# Patient Record
Sex: Female | Born: 1935 | Race: White | Hispanic: No | Smoking: Former smoker
Health system: Southern US, Community
[De-identification: ages and names within clinical notes are randomized; demographics above are authoritative.]

## PROBLEM LIST (undated history)

## (undated) DIAGNOSIS — E119 Type 2 diabetes mellitus without complications: Secondary | ICD-10-CM

## (undated) DIAGNOSIS — F0390 Unspecified dementia without behavioral disturbance: Secondary | ICD-10-CM

## (undated) DIAGNOSIS — K219 Gastro-esophageal reflux disease without esophagitis: Secondary | ICD-10-CM

## (undated) DIAGNOSIS — M1990 Unspecified osteoarthritis, unspecified site: Secondary | ICD-10-CM

## (undated) DIAGNOSIS — I10 Essential (primary) hypertension: Secondary | ICD-10-CM

## (undated) DIAGNOSIS — M109 Gout, unspecified: Secondary | ICD-10-CM

## (undated) DIAGNOSIS — C801 Malignant (primary) neoplasm, unspecified: Secondary | ICD-10-CM

## (undated) DIAGNOSIS — D649 Anemia, unspecified: Secondary | ICD-10-CM

## (undated) HISTORY — PX: MASTECTOMY: SHX3

## (undated) HISTORY — PX: ABDOMINAL HYSTERECTOMY: SHX81

---

## 2013-09-02 ENCOUNTER — Ambulatory Visit: Payer: Medicare Other | Admitting: Neurology

## 2014-03-07 ENCOUNTER — Encounter (HOSPITAL_COMMUNITY): Payer: Self-pay | Admitting: Emergency Medicine

## 2014-03-07 ENCOUNTER — Inpatient Hospital Stay (HOSPITAL_COMMUNITY)
Admission: EM | Admit: 2014-03-07 | Discharge: 2014-03-15 | DRG: 469 | Disposition: A | Discharge status: Skilled Nursing Facility | Payer: Medicare Other | Attending: Internal Medicine | Admitting: Internal Medicine

## 2014-03-07 DIAGNOSIS — Z87891 Personal history of nicotine dependence: Secondary | ICD-10-CM

## 2014-03-07 DIAGNOSIS — Z23 Encounter for immunization: Secondary | ICD-10-CM

## 2014-03-07 DIAGNOSIS — D62 Acute posthemorrhagic anemia: Secondary | ICD-10-CM | POA: Diagnosis not present

## 2014-03-07 DIAGNOSIS — S72009A Fracture of unspecified part of neck of unspecified femur, initial encounter for closed fracture: Principal | ICD-10-CM | POA: Diagnosis present

## 2014-03-07 DIAGNOSIS — G92 Toxic encephalopathy: Secondary | ICD-10-CM | POA: Diagnosis present

## 2014-03-07 DIAGNOSIS — S72002A Fracture of unspecified part of neck of left femur, initial encounter for closed fracture: Secondary | ICD-10-CM

## 2014-03-07 DIAGNOSIS — I10 Essential (primary) hypertension: Secondary | ICD-10-CM | POA: Diagnosis present

## 2014-03-07 DIAGNOSIS — F0390 Unspecified dementia without behavioral disturbance: Secondary | ICD-10-CM | POA: Diagnosis present

## 2014-03-07 DIAGNOSIS — G929 Unspecified toxic encephalopathy: Secondary | ICD-10-CM | POA: Diagnosis present

## 2014-03-07 DIAGNOSIS — M069 Rheumatoid arthritis, unspecified: Secondary | ICD-10-CM | POA: Diagnosis present

## 2014-03-07 DIAGNOSIS — W19XXXA Unspecified fall, initial encounter: Secondary | ICD-10-CM | POA: Diagnosis present

## 2014-03-07 DIAGNOSIS — R000 Tachycardia, unspecified: Secondary | ICD-10-CM | POA: Diagnosis present

## 2014-03-07 DIAGNOSIS — IMO0002 Reserved for concepts with insufficient information to code with codable children: Secondary | ICD-10-CM

## 2014-03-07 DIAGNOSIS — M1990 Unspecified osteoarthritis, unspecified site: Secondary | ICD-10-CM | POA: Diagnosis present

## 2014-03-07 DIAGNOSIS — Z7982 Long term (current) use of aspirin: Secondary | ICD-10-CM

## 2014-03-07 DIAGNOSIS — Z79899 Other long term (current) drug therapy: Secondary | ICD-10-CM

## 2014-03-07 DIAGNOSIS — E119 Type 2 diabetes mellitus without complications: Secondary | ICD-10-CM

## 2014-03-07 DIAGNOSIS — S7292XA Unspecified fracture of left femur, initial encounter for closed fracture: Secondary | ICD-10-CM | POA: Diagnosis present

## 2014-03-07 HISTORY — DX: Anemia, unspecified: D64.9

## 2014-03-07 HISTORY — DX: Gastro-esophageal reflux disease without esophagitis: K21.9

## 2014-03-07 HISTORY — DX: Gout, unspecified: M10.9

## 2014-03-07 HISTORY — DX: Unspecified dementia, unspecified severity, without behavioral disturbance, psychotic disturbance, mood disturbance, and anxiety: F03.90

## 2014-03-07 HISTORY — DX: Type 2 diabetes mellitus without complications: E11.9

## 2014-03-07 HISTORY — DX: Essential (primary) hypertension: I10

## 2014-03-07 HISTORY — DX: Malignant (primary) neoplasm, unspecified: C80.1

## 2014-03-07 HISTORY — DX: Unspecified osteoarthritis, unspecified site: M19.90

## 2014-03-07 MED ORDER — FENTANYL CITRATE 0.05 MG/ML IJ SOLN
50.0000 ug | Freq: Once | INTRAMUSCULAR | Status: DC
  Filled 2014-03-07: qty 2

## 2014-03-07 MED ORDER — FENTANYL CITRATE 0.05 MG/ML IJ SOLN
50.0000 ug | Freq: Once | INTRAMUSCULAR | Status: AC
  Administered 2014-03-07: 50 ug via INTRAMUSCULAR

## 2014-03-07 NOTE — ED Notes (Signed)
Pt's daughter st's pt has been c/o pain in left lower back radiating into left hip and left upper leg for approx 2 weeks.  St's pt has not walked since pain started.  Daughter st's pt was able to walk before pain began but wouldn't.

## 2014-03-07 NOTE — ED Provider Notes (Addendum)
CSN: [id number]     Arrival date & time 03/07/14  2005 History   First MD Initiated Contact with Patient 03/07/14 2212     Chief Complaint  Patient presents with  . Hip Pain     (Consider location/radiation/quality/duration/timing/severity/associated sxs/prior Treatment) Patient is a 80 y.o. female presenting with hip pain. The history is provided by the patient.  Hip Pain This is a recurrent problem. Pertinent negatives include no chest pain, no abdominal pain, no headaches and no shortness of breath.  patient presents with pain in her left hip and back. She some chronic back pain, but has been much worse recently. She did have a fall somewhat recently. She does have a history of mild dementia. She is being transferred from a nursing home in another town to one in [place]. She's no longer able to ambulate due to to the pain. There may also be some weakness. No dysuria. No abdominal pain. No chest pain. She has had chronic back pains and has had previous injections, but states it has not gotten better.  Past Medical History  Diagnosis Date  . Arthritis   . Gout   . Osteoarthritis   . Diabetes mellitus without complication   . Hypertension   . Dementia   . Cancer   . Anemia   . GERD (gastroesophageal reflux disease)    Past Surgical History  Procedure Laterality Date  . Mastectomy    . Abdominal hysterectomy     No family history on file. History  Substance Use Topics  . Smoking status: Former Research scientist (life sciences)  . Smokeless tobacco: Not on file  . Alcohol Use: No   OB History   Grav Para Term Preterm Abortions TAB SAB Ect Mult Living                 Review of Systems  Constitutional: Negative for activity change and appetite change.  Eyes: Negative for pain.  Respiratory: Negative for chest tightness and shortness of breath.   Cardiovascular: Negative for chest pain and leg swelling.  Gastrointestinal: Negative for nausea, vomiting, abdominal pain and diarrhea.   Genitourinary: Negative for flank pain.  Musculoskeletal: Positive for back pain and gait problem. Negative for neck stiffness.  Skin: Negative for rash.  Neurological: Negative for weakness, numbness and headaches.  Psychiatric/Behavioral: Negative for behavioral problems.      Allergies  Review of patient's allergies indicates no known allergies.  Home Medications   Current Outpatient Rx  Name  Route  Sig  Dispense  Refill  . aspirin 81 MG chewable tablet   Oral   Chew 81 mg by mouth daily.         . B Complex Vitamins (VITAMIN B COMPLEX PO)   Oral   Take 1 capsule by mouth daily.         . Cholecalciferol (VITAMIN D-3) 1000 UNITS CAPS   Oral   Take 1 capsule by mouth daily.         . furosemide (LASIX) 20 MG tablet   Oral   Take 20 mg by mouth daily.         Marland Kitchen gabapentin (NEURONTIN) 100 MG capsule   Oral   Take 100 mg by mouth 3 (three) times daily.         . lansoprazole (PREVACID) 30 MG capsule   Oral   Take 30 mg by mouth daily at 12 noon.         . leflunomide (ARAVA) 20 MG tablet  Oral   Take 20 mg by mouth daily.         Marland Kitchen OVER THE COUNTER MEDICATION   Oral   Take 1 tablet by mouth daily. Medication name: Therapy M9mg -458mcg tablet         . predniSONE (DELTASONE) 2.5 MG tablet   Oral   Take 2.5 mg by mouth 3 (three) times daily. Take 2 tablets in the morning and 1 tablet at bed time.         . simethicone (MI-ACID GAS RELIEF) 80 MG chewable tablet   Oral   Chew 80 mg by mouth 2 (two) times daily.         . sitaGLIPtin (JANUVIA) 50 MG tablet   Oral   Take 50 mg by mouth daily.         . traMADol (ULTRAM-ER) 100 MG 24 hr tablet   Oral   Take 100 mg by mouth daily.          BP 117/51  Pulse 100  Temp(Src) 98.3 F (36.8 C) (Oral)  SpO2 96% Physical Exam  Constitutional: She appears well-developed and well-nourished.  HENT:  Head: Normocephalic.  Neck: Normal range of motion. Neck supple.  Cardiovascular:  Normal rate and regular rhythm.   Pulmonary/Chest: Effort normal.  Abdominal: Soft. There is tenderness.  Right upper quadrant tenderness without rebound or guarding. No masses.  Musculoskeletal: She exhibits tenderness.  No lumbar tenderness. There is pain with palpation over the left hip. There is pain with rotation of left hip. There is pain with straight leg raise that starts with the foot around 6 inches of the bed. She's neurovascularly intact over bilateral feet. Pulses intact.    ED Course  Procedures (including critical care time) Labs Review Labs Reviewed  URINALYSIS, ROUTINE W REFLEX MICROSCOPIC - Abnormal; Notable for the following:    APPearance CLOUDY (*)    Leukocytes, UA TRACE (*)    All other components within normal limits  URINE MICROSCOPIC-ADD ON - Abnormal; Notable for the following:    Casts HYALINE CASTS (*)    All other components within normal limits  CBC WITH DIFFERENTIAL  COMPREHENSIVE METABOLIC PANEL   Imaging Review Dg Lumbar Spine Complete  03/08/2014   CLINICAL DATA Low back pain, no known injury.  EXAM LUMBAR SPINE - COMPLETE 4+ VIEW  COMPARISON None.  FINDINGS Multilevel degenerative changes with osteophyte formation and disc height loss. Diffuse osteopenia. There is mild superior endplate height loss at L5. Maintained vertebral body alignment. Atherosclerotic vascular calcifications. Lower lumbar/lumbosacral facet arthropathy.  IMPRESSION Multilevel degenerative changes. Mild superior endplate height loss at L5 is age indeterminate. Correlate for point tenderness.  SIGNATURE  Electronically Signed   By: Carlos Levering M.D.   On: 03/08/2014 01:34   Dg Hip Complete Left  03/08/2014   CLINICAL DATA Left hip pain.  EXAM LEFT HIP - COMPLETE 2+ VIEW  COMPARISON None.  FINDINGS Severe deformity of left femoral head is noted consistent with avascular necrosis. Subsequent degenerative joint disease of the left hip is noted. There is noted some lucency in the  region of the left femoral neck which may represent fracture.  IMPRESSION Severe deformity of left femoral head is noted consistent with avascular necrosis and subsequent degenerative joint disease of the left hip. Curvilinear lucency is seen below the left femoral head which may suggest fracture ; CT scan of the hip is recommended for further evaluation.  SIGNATURE  Electronically Signed   By: Dionne Ano.D.  On: 03/08/2014 01:30     EKG Interpretation None      MDM   Final diagnoses:  None    Patient with acute on chronic left hip pain. Unable to ambulate. Did have a fall a few weeks ago. X-ray pending at this time. If no fracture seen may need a CT.    Jasper Riling. Alvino Chapel, MD 03/08/14 0115  X-ray shows avascular necrosis of left hip with question of fracture. CT recommended.  Jasper Riling. Alvino Chapel, MD 03/08/14 [id number]

## 2014-03-07 NOTE — ED Notes (Signed)
Pt denies any injury or fall.  Strong pedal pulse present

## 2014-03-07 NOTE — ED Notes (Signed)
Pt arrives via EMS. Pt was driving from one nursing home to another with her daughter. Pt has had increased weakness over past 2 weeks. Their car had broken down and EMS was called with c/o severe left hip pain, unable to stand.

## 2014-03-08 ENCOUNTER — Emergency Department (HOSPITAL_COMMUNITY): Payer: Medicare Other

## 2014-03-08 DIAGNOSIS — S72002A Fracture of unspecified part of neck of left femur, initial encounter for closed fracture: Secondary | ICD-10-CM | POA: Diagnosis present

## 2014-03-08 DIAGNOSIS — S72009A Fracture of unspecified part of neck of unspecified femur, initial encounter for closed fracture: Principal | ICD-10-CM

## 2014-03-08 DIAGNOSIS — F0390 Unspecified dementia without behavioral disturbance: Secondary | ICD-10-CM | POA: Diagnosis present

## 2014-03-08 DIAGNOSIS — I10 Essential (primary) hypertension: Secondary | ICD-10-CM | POA: Diagnosis present

## 2014-03-08 DIAGNOSIS — S7290XA Unspecified fracture of unspecified femur, initial encounter for closed fracture: Secondary | ICD-10-CM

## 2014-03-08 DIAGNOSIS — M069 Rheumatoid arthritis, unspecified: Secondary | ICD-10-CM | POA: Diagnosis present

## 2014-03-08 DIAGNOSIS — S7292XA Unspecified fracture of left femur, initial encounter for closed fracture: Secondary | ICD-10-CM | POA: Diagnosis present

## 2014-03-08 DIAGNOSIS — E119 Type 2 diabetes mellitus without complications: Secondary | ICD-10-CM | POA: Diagnosis present

## 2014-03-08 DIAGNOSIS — IMO0002 Reserved for concepts with insufficient information to code with codable children: Secondary | ICD-10-CM

## 2014-03-08 LAB — COMPREHENSIVE METABOLIC PANEL
ALT: 30 U/L (ref 0–35)
AST: 37 U/L (ref 0–37)
Albumin: 2.4 g/dL — ABNORMAL LOW (ref 3.5–5.2)
Alkaline Phosphatase: 229 U/L — ABNORMAL HIGH (ref 39–117)
BUN: 38 mg/dL — ABNORMAL HIGH (ref 6–23)
CALCIUM: 9 mg/dL (ref 8.4–10.5)
CO2: 27 meq/L (ref 19–32)
CREATININE: 1.37 mg/dL — AB (ref 0.50–1.10)
Chloride: 99 mEq/L (ref 96–112)
GFR calc non Af Amer: 33 mL/min — ABNORMAL LOW (ref >90)
GFR, EST AFRICAN AMERICAN: 39 mL/min — AB (ref >90)
Glucose, Bld: 115 mg/dL — ABNORMAL HIGH (ref 70–99)
Potassium: 3.7 mEq/L (ref 3.7–5.3)
Sodium: 139 mEq/L (ref 137–147)
Total Bilirubin: 0.4 mg/dL (ref 0.3–1.2)
Total Protein: 6.4 g/dL (ref 6.0–8.3)

## 2014-03-08 LAB — GLUCOSE, CAPILLARY
Glucose-Capillary: 122 mg/dL — ABNORMAL HIGH (ref 70–99)
Glucose-Capillary: 97 mg/dL (ref 70–99)

## 2014-03-08 LAB — URINALYSIS, ROUTINE W REFLEX MICROSCOPIC
Bilirubin Urine: NEGATIVE
Glucose, UA: NEGATIVE mg/dL
Hgb urine dipstick: NEGATIVE
Ketones, ur: NEGATIVE mg/dL
NITRITE: NEGATIVE
Protein, ur: NEGATIVE mg/dL
SPECIFIC GRAVITY, URINE: 1.016 (ref 1.005–1.030)
UROBILINOGEN UA: 1 mg/dL (ref 0.0–1.0)
pH: 5.5 (ref 5.0–8.0)

## 2014-03-08 LAB — CBC WITH DIFFERENTIAL/PLATELET
Basophils Absolute: 0 10*3/uL (ref 0.0–0.1)
Basophils Relative: 1 % (ref 0–1)
Eosinophils Absolute: 0.1 10*3/uL (ref 0.0–0.7)
Eosinophils Relative: 1 % (ref 0–5)
HEMATOCRIT: 33 % — AB (ref 36.0–46.0)
Hemoglobin: 10.7 g/dL — ABNORMAL LOW (ref 12.0–15.0)
LYMPHS ABS: 1.6 10*3/uL (ref 0.7–4.0)
LYMPHS PCT: 19 % (ref 12–46)
MCH: 30.5 pg (ref 26.0–34.0)
MCHC: 32.4 g/dL (ref 30.0–36.0)
MCV: 94 fL (ref 78.0–100.0)
Monocytes Absolute: 0.9 10*3/uL (ref 0.1–1.0)
Monocytes Relative: 11 % (ref 3–12)
Neutro Abs: 5.7 10*3/uL (ref 1.7–7.7)
Neutrophils Relative %: 69 % (ref 43–77)
Platelets: ADEQUATE 10*3/uL (ref 150–400)
RBC: 3.51 MIL/uL — AB (ref 3.87–5.11)
RDW: 14.9 % (ref 11.5–15.5)
WBC: 8.4 10*3/uL (ref 4.0–10.5)

## 2014-03-08 LAB — TYPE AND SCREEN
ABO/RH(D): O POS
Antibody Screen: NEGATIVE

## 2014-03-08 LAB — URINE MICROSCOPIC-ADD ON

## 2014-03-08 LAB — HEMOGLOBIN A1C
Hgb A1c MFr Bld: 5.4 % (ref <5.7)
MEAN PLASMA GLUCOSE: 108 mg/dL (ref <117)

## 2014-03-08 LAB — ABO/RH: ABO/RH(D): O POS

## 2014-03-08 LAB — PROTIME-INR
INR: 0.87 (ref 0.00–1.49)
Prothrombin Time: 11.7 seconds (ref 11.6–15.2)

## 2014-03-08 MED ORDER — METHOCARBAMOL 500 MG PO TABS
500.0000 mg | ORAL_TABLET | Freq: Four times a day (QID) | ORAL | Status: DC | PRN
  Administered 2014-03-08 – 2014-03-10 (×3): 500 mg via ORAL
  Filled 2014-03-08 (×3): qty 1

## 2014-03-08 MED ORDER — HYDROCODONE-ACETAMINOPHEN 5-325 MG PO TABS
1.0000 | ORAL_TABLET | Freq: Four times a day (QID) | ORAL | Status: DC | PRN
  Administered 2014-03-08 (×2): 2 via ORAL
  Filled 2014-03-08 (×2): qty 2

## 2014-03-08 MED ORDER — VITAMIN D3 25 MCG (1000 UNIT) PO TABS
1000.0000 [IU] | ORAL_TABLET | Freq: Every day | ORAL | Status: DC
  Administered 2014-03-10 – 2014-03-15 (×6): 1000 [IU] via ORAL
  Filled 2014-03-08 (×8): qty 1

## 2014-03-08 MED ORDER — HYDROMORPHONE HCL PF 1 MG/ML IJ SOLN
0.5000 mg | Freq: Once | INTRAMUSCULAR | Status: AC
  Administered 2014-03-08: 0.5 mg via INTRAVENOUS

## 2014-03-08 MED ORDER — MORPHINE SULFATE 2 MG/ML IJ SOLN
0.5000 mg | INTRAMUSCULAR | Status: DC | PRN
  Administered 2014-03-08 – 2014-03-09 (×4): 0.5 mg via INTRAVENOUS
  Filled 2014-03-08 (×4): qty 1

## 2014-03-08 MED ORDER — METHOCARBAMOL 100 MG/ML IJ SOLN: 500.0000 mg | Freq: Four times a day (QID) | INTRAVENOUS | Status: DC | PRN

## 2014-03-08 MED ORDER — PANTOPRAZOLE SODIUM 40 MG PO TBEC
40.0000 mg | DELAYED_RELEASE_TABLET | Freq: Every day | ORAL | Status: DC
Start: 2014-03-08 — End: 2014-03-15
  Administered 2014-03-10 – 2014-03-15 (×6): 40 mg via ORAL
  Filled 2014-03-08 (×5): qty 1

## 2014-03-08 MED ORDER — ASPIRIN 81 MG PO CHEW
81.0000 mg | CHEWABLE_TABLET | Freq: Every day | ORAL | Status: DC
  Filled 2014-03-08: qty 1

## 2014-03-08 MED ORDER — CEFAZOLIN SODIUM-DEXTROSE 2-3 GM-% IV SOLR
2.0000 g | INTRAVENOUS | Status: AC
  Administered 2014-03-09: 2 g via INTRAVENOUS
  Filled 2014-03-08: qty 50

## 2014-03-08 MED ORDER — INSULIN ASPART 100 UNIT/ML ~~LOC~~ SOLN: 0.0000 [IU] | Freq: Every day | SUBCUTANEOUS | Status: DC

## 2014-03-08 MED ORDER — PREDNISONE 2.5 MG PO TABS
2.5000 mg | ORAL_TABLET | Freq: Three times a day (TID) | ORAL | Status: DC
  Administered 2014-03-08 – 2014-03-15 (×18): 2.5 mg via ORAL
  Filled 2014-03-08 (×25): qty 1

## 2014-03-08 MED ORDER — HYDROMORPHONE HCL PF 1 MG/ML IJ SOLN
INTRAMUSCULAR | Status: AC
  Filled 2014-03-08: qty 1

## 2014-03-08 MED ORDER — LEFLUNOMIDE 20 MG PO TABS
20.0000 mg | ORAL_TABLET | Freq: Every day | ORAL | Status: DC
  Administered 2014-03-10 – 2014-03-15 (×6): 20 mg via ORAL
  Filled 2014-03-08 (×8): qty 1

## 2014-03-08 MED ORDER — HYDROMORPHONE HCL PF 1 MG/ML IJ SOLN
1.0000 mg | Freq: Once | INTRAMUSCULAR | Status: DC
  Filled 2014-03-08: qty 1

## 2014-03-08 MED ORDER — SIMETHICONE 80 MG PO CHEW
80.0000 mg | CHEWABLE_TABLET | Freq: Two times a day (BID) | ORAL | Status: DC
  Administered 2014-03-08 – 2014-03-15 (×11): 80 mg via ORAL
  Filled 2014-03-08 (×19): qty 1

## 2014-03-08 MED ORDER — HYDROMORPHONE HCL PF 1 MG/ML IJ SOLN: 1.0000 mg | INTRAMUSCULAR | Status: AC | PRN

## 2014-03-08 MED ORDER — INSULIN ASPART 100 UNIT/ML ~~LOC~~ SOLN
0.0000 [IU] | Freq: Three times a day (TID) | SUBCUTANEOUS | Status: DC
  Administered 2014-03-11: 1 [IU] via SUBCUTANEOUS
  Administered 2014-03-11 – 2014-03-14 (×2): 2 [IU] via SUBCUTANEOUS
  Administered 2014-03-15: 1 [IU] via SUBCUTANEOUS

## 2014-03-08 NOTE — ED Notes (Signed)
Pt to CT at this time.

## 2014-03-08 NOTE — Progress Notes (Signed)
   CARE MANAGEMENT NOTE 03/08/2014  Patient:  Michelle Yoder, Michelle Yoder   Account Number:  [id number]  Date Initiated:  03/08/2014  Documentation initiated by:  Olga Coaster  Subjective/Objective Assessment:   ADMITTED POST FALL/ HIP FRACTURE     Action/Plan:   CM FOLLOWING FOR DCP   Anticipated DC Date:  03/14/2014   Anticipated DC Plan:  SKILLED NURSING FACILITY  In-house referral  Clinical Social Worker      DC Planning Services  CM consult         Status of service:  In process, will continue to follow Medicare Important Message given?  NA - LOS <3 / Initial given by admissions (If response is "NO", the following Medicare IM given date fields will be blank)  Per UR Regulation:  Reviewed for med. necessity/level of care/duration of stay  Comments:  3/11/2015Mindi Slicker RN,BSN,MHA [phone]

## 2014-03-08 NOTE — Consult Note (Signed)
ORTHOPAEDIC CONSULTATION  REQUESTING PHYSICIAN: Ripudeep Krystal Eaton, MD  Chief Complaint: Left hip fracture  HPI: Michelle Yoder is a 80 y.o. female who complains of  Left femoral neck fracture  Past Medical History  Diagnosis Date  . Arthritis   . Gout   . Osteoarthritis   . Diabetes mellitus without complication   . Hypertension   . Dementia   . Cancer   . Anemia   . GERD (gastroesophageal reflux disease)    Past Surgical History  Procedure Laterality Date  . Mastectomy    . Abdominal hysterectomy     History   Social History  . Marital Status: Widowed    Spouse Name: N/A    Number of Children: N/A  . Years of Education: N/A   Social History Main Topics  . Smoking status: Former Research scientist (life sciences)  . Smokeless tobacco: None  . Alcohol Use: No  . Drug Use: No  . Sexual Activity: None   Other Topics Concern  . None   Social History Narrative  . None   No family history on file. No Known Allergies Prior to Admission medications   Medication Sig Start Date End Date Taking? Authorizing Provider  aspirin 81 MG chewable tablet Chew 81 mg by mouth daily.   Yes Historical Provider, MD  B Complex Vitamins (VITAMIN B COMPLEX PO) Take 1 capsule by mouth daily.   Yes Historical Provider, MD  Cholecalciferol (VITAMIN D-3) 1000 UNITS CAPS Take 1 capsule by mouth daily.   Yes Historical Provider, MD  furosemide (LASIX) 20 MG tablet Take 20 mg by mouth daily.   Yes Historical Provider, MD  gabapentin (NEURONTIN) 100 MG capsule Take 100 mg by mouth 3 (three) times daily.   Yes Historical Provider, MD  lansoprazole (PREVACID) 30 MG capsule Take 30 mg by mouth daily at 12 noon.   Yes Historical Provider, MD  leflunomide (ARAVA) 20 MG tablet Take 20 mg by mouth daily.   Yes Historical Provider, MD  OVER THE COUNTER MEDICATION Take 1 tablet by mouth daily. Medication name: Therapy M71m-400mcg tablet   Yes Historical Provider, MD  predniSONE (DELTASONE) 2.5 MG tablet Take 2.5 mg by  mouth 3 (three) times daily. Take 2 tablets in the morning and 1 tablet at bed time.   Yes Historical Provider, MD  simethicone (MI-ACID GAS RELIEF) 80 MG chewable tablet Chew 80 mg by mouth 2 (two) times daily.   Yes Historical Provider, MD  sitaGLIPtin (JANUVIA) 50 MG tablet Take 50 mg by mouth daily.   Yes Historical Provider, MD  traMADol (ULTRAM-ER) 100 MG 24 hr tablet Take 100 mg by mouth daily.   Yes Historical Provider, MD   Dg Lumbar Spine Complete  03/08/2014   CLINICAL DATA Low back pain, no known injury.  EXAM LUMBAR SPINE - COMPLETE 4+ VIEW  COMPARISON None.  FINDINGS Multilevel degenerative changes with osteophyte formation and disc height loss. Diffuse osteopenia. There is mild superior endplate height loss at L5. Maintained vertebral body alignment. Atherosclerotic vascular calcifications. Lower lumbar/lumbosacral facet arthropathy.  IMPRESSION Multilevel degenerative changes. Mild superior endplate height loss at L5 is age indeterminate. Correlate for point tenderness.  SIGNATURE  Electronically Signed   By: ACarlos LeveringM.D.   On: 03/08/2014 01:34   Dg Hip Complete Left  03/08/2014   CLINICAL DATA Left hip pain.  EXAM LEFT HIP - COMPLETE 2+ VIEW  COMPARISON None.  FINDINGS Severe deformity of left femoral head is noted consistent with avascular necrosis.  Subsequent degenerative joint disease of the left hip is noted. There is noted some lucency in the region of the left femoral neck which may represent fracture.  IMPRESSION Severe deformity of left femoral head is noted consistent with avascular necrosis and subsequent degenerative joint disease of the left hip. Curvilinear lucency is seen below the left femoral head which may suggest fracture ; CT scan of the hip is recommended for further evaluation.  SIGNATURE  Electronically Signed   By: Sabino Dick M.D.   On: 03/08/2014 01:30   Ct Hip Left Wo Contrast  03/08/2014   CLINICAL DATA Left hip pain and left upper leg pain for 2  weeks.  EXAM CT OF THE LEFT HIP WITHOUT CONTRAST  TECHNIQUE Multidetector CT imaging was performed according to the standard protocol. Multiplanar CT image reconstructions were also generated.  COMPARISON DG HIP COMPLETE*L* dated 03/08/2014  FINDINGS Comminuted acute to subacute appearing fracture of the femoral head with impaction and sclerotic femoral head fragments. The fracture extends from the articular surface to the femoral head neck junction. No dislocation.  Subcentimeter apparent loose bodies extending into the iliopsoas bursa.  Bone mineral density is decreased with enthesopathy at greater trochanter. No destructive bony lesions. Limited view of the pelvis demonstrates sigmoid diverticulosis and phleboliths.  IMPRESSION Comminuted femoral subcapital femur fracture extending through the head to the neck junction with impaction. Findings may reflect subacute insufficiency fracture. No dislocation.  Please note, mildly sclerotic fracture fragments may reflect impaction, though are nonspecific. This is highly unlikely to reflect pathologic fracture.  SIGNATURE  Electronically Signed   By: Elon Alas   On: 03/08/2014 02:50    Positive ROS: All other systems have been reviewed and were otherwise negative with the exception of those mentioned in the HPI and as above.  Labs cbc  Recent Labs  03/08/14 0135  WBC 8.4  HGB 10.7*  HCT 33.0*  PLT PLATELET CLUMPS NOTED ON SMEAR, COUNT APPEARS ADEQUATE    Labs inflam No results found for this basename: ESR, CRP,  in the last 72 hours  Labs coag  Recent Labs  03/08/14 0710  INR 0.87     Recent Labs  03/08/14 0135  NA 139  K 3.7  CL 99  CO2 27  GLUCOSE 115*  BUN 38*  CREATININE 1.37*  CALCIUM 9.0    Physical Exam: Filed Vitals:   03/08/14 1514  BP:   Pulse:   Temp:   Resp: 20   General: Alert, no acute distress Cardiovascular: No pedal edema Respiratory: No cyanosis, no use of accessory musculature GI: No  organomegaly, abdomen is soft and non-tender Skin: No lesions in the area of chief complaint Neurologic: Sensation intact distally Lymphatic: No axillary or cervical lymphadenopathy  MUSCULOSKELETAL:  LLE: Pain with Log roll, wiggles toes, WWP Other extremities are atraumatic with painless ROM and NVI.  Assessment: Left femoral neck fracture  Plan: Left hip hemiarthroplasty on 3/12 Hold chemical dvt px for OR Weight Bearing Status: Bedrest for now, WBAT post op PT VTE px: SCD's and chemical post op, likely ASA   Edmonia Lynch, D, MD Cell [phone]   03/08/2014 5:19 PM

## 2014-03-08 NOTE — ED Notes (Signed)
IV team paged to attempt IV access.

## 2014-03-08 NOTE — Progress Notes (Addendum)
Lab notified this nurse that patient refused lab draws.Pt also refused EKG at this time. On call notified. Will pass onto next shift.  Verdie Drown RN BSN

## 2014-03-08 NOTE — H&P (Signed)
Chief Complaint:  Left hip pain  HPI: 80 yo female with h/o RA dep on steroids, dm, htn, mild dementia comes in with 3 weeks of left hip pain.  She has chronic back pain, but her left hip worsened after a fall 3 weeks ago.  pts daughter actually went to pick her up at her assisted living in [place], and was driving her here to a local assisted living.  During the transportation, the pt pain got much worse.  And unfortunately their car broke down enroute, by this time pts hip pain was severe so daughter called 911 from the side of the road.  She normally walks with cane [name] but has had much difficulty in doing this for over 2 weeks.  She has been getting injections in her left hip from a radiologist for the pain, last one was 3weeks ago, unsure if this was before or after the fall.  Review of Systems:  Positive and negative as per HPI otherwise all other systems are negative per dtr  Past Medical History: Past Medical History  Diagnosis Date  . Arthritis   . Gout   . Osteoarthritis   . Diabetes mellitus without complication   . Hypertension   . Dementia   . Cancer   . Anemia   . GERD (gastroesophageal reflux disease)    Past Surgical History  Procedure Laterality Date  . Mastectomy    . Abdominal hysterectomy      Medications: Prior to Admission medications   Medication Sig Start Date End Date Taking? Authorizing Provider  aspirin 81 MG chewable tablet Chew 81 mg by mouth daily.   Yes Historical Provider, MD  B Complex Vitamins (VITAMIN B COMPLEX PO) Take 1 capsule by mouth daily.   Yes Historical Provider, MD  Cholecalciferol (VITAMIN D-3) 1000 UNITS CAPS Take 1 capsule by mouth daily.   Yes Historical Provider, MD  furosemide (LASIX) 20 MG tablet Take 20 mg by mouth daily.   Yes Historical Provider, MD  gabapentin (NEURONTIN) 100 MG capsule Take 100 mg by mouth 3 (three) times daily.   Yes Historical Provider, MD  lansoprazole (PREVACID) 30 MG capsule Take 30 mg by mouth  daily at 12 noon.   Yes Historical Provider, MD  leflunomide (ARAVA) 20 MG tablet Take 20 mg by mouth daily.   Yes Historical Provider, MD  OVER THE COUNTER MEDICATION Take 1 tablet by mouth daily. Medication name: Therapy M9mg -453mcg tablet   Yes Historical Provider, MD  predniSONE (DELTASONE) 2.5 MG tablet Take 2.5 mg by mouth 3 (three) times daily. Take 2 tablets in the morning and 1 tablet at bed time.   Yes Historical Provider, MD  simethicone (MI-ACID GAS RELIEF) 80 MG chewable tablet Chew 80 mg by mouth 2 (two) times daily.   Yes Historical Provider, MD  sitaGLIPtin (JANUVIA) 50 MG tablet Take 50 mg by mouth daily.   Yes Historical Provider, MD  traMADol (ULTRAM-ER) 100 MG 24 hr tablet Take 100 mg by mouth daily.   Yes Historical Provider, MD    Allergies:  No Known Allergies  Social History:  reports that she has quit smoking. She does not have any smokeless tobacco history on file. She reports that she does not drink alcohol or use illicit drugs.  Family History: none  Physical Exam: Filed Vitals:   03/07/14 2023 03/08/14 0230 03/08/14 0330  BP: 117/51 138/119 153/71  Pulse: 100 95 94  Temp: 98.3 F (36.8 C)    TempSrc: Oral  SpO2: 96% 97% 95%   General appearance: alert, cooperative and no distress Head: Normocephalic, without obvious abnormality, atraumatic Eyes: negative Nose: Nares normal. Septum midline. Mucosa normal. No drainage or sinus tenderness. Neck: no JVD and supple, symmetrical, trachea midline Lungs: clear to auscultation bilaterally Heart: regular rate and rhythm, S1, S2 normal, no murmur, click, rub or gallop Abdomen: soft, non-tender; bowel sounds normal; no masses,  no organomegaly Extremities: extremities normal, atraumatic, no cyanosis or edema  Left hip pain with minimal movement Pulses: 2+ and symmetric Skin: Skin color, texture, turgor normal. No rashes or lesions Neurologic: Grossly normal  Labs on Admission:   Recent Labs   03/08/14 0135  NA 139  K 3.7  CL 99  CO2 27  GLUCOSE 115*  BUN 38*  CREATININE 1.37*  CALCIUM 9.0    Recent Labs  03/08/14 0135  AST 37  ALT 30  ALKPHOS 229*  BILITOT 0.4  PROT 6.4  ALBUMIN 2.4*    Recent Labs  03/08/14 0135  WBC 8.4  NEUTROABS 5.7  HGB 10.7*  HCT 33.0*  MCV 94.0  PLT PLATELET CLUMPS NOTED ON SMEAR, COUNT APPEARS ADEQUATE    Radiological Exams on Admission: Dg Lumbar Spine Complete  03/08/2014   CLINICAL DATA Low back pain, no known injury.  EXAM LUMBAR SPINE - COMPLETE 4+ VIEW  COMPARISON None.  FINDINGS Multilevel degenerative changes with osteophyte formation and disc height loss. Diffuse osteopenia. There is mild superior endplate height loss at L5. Maintained vertebral body alignment. Atherosclerotic vascular calcifications. Lower lumbar/lumbosacral facet arthropathy.  IMPRESSION Multilevel degenerative changes. Mild superior endplate height loss at L5 is age indeterminate. Correlate for point tenderness.  SIGNATURE  Electronically Signed   By: Carlos Levering M.D.   On: 03/08/2014 01:34   Dg Hip Complete Left  03/08/2014   CLINICAL DATA Left hip pain.  EXAM LEFT HIP - COMPLETE 2+ VIEW  COMPARISON None.  FINDINGS Severe deformity of left femoral head is noted consistent with avascular necrosis. Subsequent degenerative joint disease of the left hip is noted. There is noted some lucency in the region of the left femoral neck which may represent fracture.  IMPRESSION Severe deformity of left femoral head is noted consistent with avascular necrosis and subsequent degenerative joint disease of the left hip. Curvilinear lucency is seen below the left femoral head which may suggest fracture ; CT scan of the hip is recommended for further evaluation.  SIGNATURE  Electronically Signed   By: Sabino Dick M.D.   On: 03/08/2014 01:30   Ct Hip Left Wo Contrast  03/08/2014   CLINICAL DATA Left hip pain and left upper leg pain for 2 weeks.  EXAM CT OF THE LEFT HIP  WITHOUT CONTRAST  TECHNIQUE Multidetector CT imaging was performed according to the standard protocol. Multiplanar CT image reconstructions were also generated.  COMPARISON DG HIP COMPLETE*L* dated 03/08/2014  FINDINGS Comminuted acute to subacute appearing fracture of the femoral head with impaction and sclerotic femoral head fragments. The fracture extends from the articular surface to the femoral head neck junction. No dislocation.  Subcentimeter apparent loose bodies extending into the iliopsoas bursa.  Bone mineral density is decreased with enthesopathy at greater trochanter. No destructive bony lesions. Limited view of the pelvis demonstrates sigmoid diverticulosis and phleboliths.  IMPRESSION Comminuted femoral subcapital femur fracture extending through the head to the neck junction with impaction. Findings may reflect subacute insufficiency fracture. No dislocation.  Please note, mildly sclerotic fracture fragments may reflect impaction, though are nonspecific.  This is highly unlikely to reflect pathologic fracture.  SIGNATURE  Electronically Signed   By: Elon Alas   On: 03/08/2014 02:50    Assessment/Plan  80 yo female with left impacted femur  Principal Problem:   Hip fracture, left-  Likely from fall 3 weeks ago.  Place on hip pathway.  Iv pain meds.  Will keep npo for surgery hopefully later today.  Ortho called and will see in am.   Ck preop ekg, dtr interested in nonsurgical interventions, encouraged her to discuss this with the orthopedic surgeon.    Active Problems:   Diabetes mellitus without complication   Dementia   Hypertension   Femur fracture, left   RA (rheumatoid arthritis)   Chronic use of steroids  FULL CODE  Quisha Mabie A 03/08/2014, 3:42 AM

## 2014-03-08 NOTE — Progress Notes (Signed)
Patient ID: Michelle Yoder  female  VOJ:[id number]    DOB: 05/27/1923    DOA: 03/07/2014  PCP: No PCP Per Patient  Assessment/Plan: Principal Problem:   Hip fracture, left , fall 3 weeks ago - Orthopedics was consulted in the ER, EDP spoke with Dr. Percell Miller, and planned for surgery tomorrow - Continue pain control  Active Problems:   Diabetes mellitus without complication - Placed on sliding scale insulin, continue carb modified diet, n.p.o. after midnight    Dementia Currently living in assisted living facility     Hypertension - Currently stable    RA (rheumatoid arthritis): Continue Arava and prednisone    DVT Prophylaxis: SCDs   Code Status: full code   Family Communication:discussed in detail with patient's daughter at the bedside   Disposition: will likely need skilled nursing facility at the time of dc  Consultants:  Orthopedics   Procedures:  None    Antibiotics:  Subjective: Patient alert and awake, pain control, family member at the bedside   Objective: Weight change:   Intake/Output Summary (Last 24 hours) at 03/08/14 1410 Last data filed at 03/08/14 0821  Gross per 24 hour  Intake    360 ml  Output      0 ml  Net    360 ml   Blood pressure 124/63, pulse 66, temperature 98.6 F (37 C), temperature source Oral, resp. rate 19, height 5\' 4"  (1.626 m), weight 60.5 kg (133 lb 6.1 oz), SpO2 96.00%.  Physical Exam: General: Alert and awake, oriented, NAD CVS: S1-S2 clear, no murmur rubs or gallops Chest: clear to auscultation bilaterally, no wheezing, rales or rhonchi Abdomen: soft nontender, nondistended, normal bowel sounds  Extremities: no cyanosis, clubbing or edema noted bilaterally, pain with rotation of left hip   Lab Results: Basic Metabolic Panel:  Recent Labs Lab 03/08/14 0135  NA 139  K 3.7  CL 99  CO2 27  GLUCOSE 115*  BUN 38*  CREATININE 1.37*  CALCIUM 9.0   Liver Function Tests:  Recent Labs Lab 03/08/14 0135  AST  37  ALT 30  ALKPHOS 229*  BILITOT 0.4  PROT 6.4  ALBUMIN 2.4*   No results found for this basename: LIPASE, AMYLASE,  in the last 168 hours No results found for this basename: AMMONIA,  in the last 168 hours CBC:  Recent Labs Lab 03/08/14 0135  WBC 8.4  NEUTROABS 5.7  HGB 10.7*  HCT 33.0*  MCV 94.0  PLT PLATELET CLUMPS NOTED ON SMEAR, COUNT APPEARS ADEQUATE   Cardiac Enzymes: No results found for this basename: CKTOTAL, CKMB, CKMBINDEX, TROPONINI,  in the last 168 hours BNP: No components found with this basename: POCBNP,  CBG: No results found for this basename: GLUCAP,  in the last 168 hours   Micro Results: No results found for this or any previous visit (from the past 240 hour(s)).  Studies/Results: Dg Lumbar Spine Complete  03/08/2014   CLINICAL DATA Low back pain, no known injury.  EXAM LUMBAR SPINE - COMPLETE 4+ VIEW  COMPARISON None.  FINDINGS Multilevel degenerative changes with osteophyte formation and disc height loss. Diffuse osteopenia. There is mild superior endplate height loss at L5. Maintained vertebral body alignment. Atherosclerotic vascular calcifications. Lower lumbar/lumbosacral facet arthropathy.  IMPRESSION Multilevel degenerative changes. Mild superior endplate height loss at L5 is age indeterminate. Correlate for point tenderness.  SIGNATURE  Electronically Signed   By: Carlos Levering M.D.   On: 03/08/2014 01:34   Dg Hip Complete Left  03/08/2014  CLINICAL DATA Left hip pain.  EXAM LEFT HIP - COMPLETE 2+ VIEW  COMPARISON None.  FINDINGS Severe deformity of left femoral head is noted consistent with avascular necrosis. Subsequent degenerative joint disease of the left hip is noted. There is noted some lucency in the region of the left femoral neck which may represent fracture.  IMPRESSION Severe deformity of left femoral head is noted consistent with avascular necrosis and subsequent degenerative joint disease of the left hip. Curvilinear lucency is  seen below the left femoral head which may suggest fracture ; CT scan of the hip is recommended for further evaluation.  SIGNATURE  Electronically Signed   By: Sabino Dick M.D.   On: 03/08/2014 01:30   Ct Hip Left Wo Contrast  03/08/2014   CLINICAL DATA Left hip pain and left upper leg pain for 2 weeks.  EXAM CT OF THE LEFT HIP WITHOUT CONTRAST  TECHNIQUE Multidetector CT imaging was performed according to the standard protocol. Multiplanar CT image reconstructions were also generated.  COMPARISON DG HIP COMPLETE*L* dated 03/08/2014  FINDINGS Comminuted acute to subacute appearing fracture of the femoral head with impaction and sclerotic femoral head fragments. The fracture extends from the articular surface to the femoral head neck junction. No dislocation.  Subcentimeter apparent loose bodies extending into the iliopsoas bursa.  Bone mineral density is decreased with enthesopathy at greater trochanter. No destructive bony lesions. Limited view of the pelvis demonstrates sigmoid diverticulosis and phleboliths.  IMPRESSION Comminuted femoral subcapital femur fracture extending through the head to the neck junction with impaction. Findings may reflect subacute insufficiency fracture. No dislocation.  Please note, mildly sclerotic fracture fragments may reflect impaction, though are nonspecific. This is highly unlikely to reflect pathologic fracture.  SIGNATURE  Electronically Signed   By: Elon Alas   On: 03/08/2014 02:50    Medications: Scheduled Meds: . predniSONE  2.5 mg Oral TID      LOS: 1 day   RAI,RIPUDEEP M.D. [facility] 03/08/2014, 2:10 PM Pager: [phone]  If 7PM-7AM, please contact night-coverage [email or web address] Password [email or web address]

## 2014-03-08 NOTE — ED Provider Notes (Signed)
  Physical Exam  BP 138/119  Pulse 95  Temp(Src) 98.3 F (36.8 C) (Oral)  SpO2 97%  Physical Exam  ED Course  Procedures  MDM  Assuming care of the patient from Dr. Alvino Chapel. CT was pending - pt has hiip pain with equivocal xrays. CT shows femur fracture- Spoke with Dr. Percell Miller, Orthopedics - who thinks that patient will be a surgical candidate for Thursday. Medicine to admit.   Varney Biles, MD 03/08/14 [phone]

## 2014-03-08 NOTE — Progress Notes (Signed)
Orthopedic Tech Progress Note Patient Details:  Michelle Yoder [birth year] [id number]      [name] 03/08/2014, 7:45 AM

## 2014-03-08 NOTE — ED Notes (Signed)
Pt to x-ray at this time.  Lab unable to blood.  Dr. Alvino Chapel made aware.

## 2014-03-09 ENCOUNTER — Inpatient Hospital Stay (HOSPITAL_COMMUNITY): Payer: Medicare Other

## 2014-03-09 ENCOUNTER — Inpatient Hospital Stay (HOSPITAL_COMMUNITY): Payer: Medicare Other | Admitting: Anesthesiology

## 2014-03-09 ENCOUNTER — Encounter (HOSPITAL_COMMUNITY): Payer: Medicare Other | Admitting: Anesthesiology

## 2014-03-09 ENCOUNTER — Encounter (HOSPITAL_COMMUNITY): Payer: Self-pay | Admitting: Certified Registered"

## 2014-03-09 ENCOUNTER — Encounter (HOSPITAL_COMMUNITY)
Admission: EM | Disposition: A | Discharge status: Skilled Nursing Facility | Payer: Self-pay | Source: Home / Self Care | Attending: Internal Medicine

## 2014-03-09 HISTORY — PX: HIP ARTHROPLASTY: SHX981

## 2014-03-09 LAB — GLUCOSE, CAPILLARY
Glucose-Capillary: 156 mg/dL — ABNORMAL HIGH (ref 70–99)
Glucose-Capillary: 105 mg/dL — ABNORMAL HIGH (ref 70–99)
Glucose-Capillary: 102 mg/dL — ABNORMAL HIGH (ref 70–99)
Glucose-Capillary: 134 mg/dL — ABNORMAL HIGH (ref 70–99)

## 2014-03-09 LAB — MRSA PCR SCREENING: MRSA by PCR: NEGATIVE

## 2014-03-09 SURGERY — HEMIARTHROPLASTY, HIP, DIRECT ANTERIOR APPROACH, FOR FRACTURE
Anesthesia: General | Site: Hip | Laterality: Left

## 2014-03-09 MED ORDER — HYDROMORPHONE HCL PF 1 MG/ML IJ SOLN
0.2500 mg | INTRAMUSCULAR | Status: DC | PRN
  Administered 2014-03-09 (×2): 0.5 mg via INTRAVENOUS

## 2014-03-09 MED ORDER — CEFAZOLIN SODIUM-DEXTROSE 2-3 GM-% IV SOLR
2.0000 g | Freq: Four times a day (QID) | INTRAVENOUS | Status: AC
  Administered 2014-03-09 (×2): 2 g via INTRAVENOUS
  Filled 2014-03-09 (×2): qty 50

## 2014-03-09 MED ORDER — LIDOCAINE HCL 4 % MT SOLN
OROMUCOSAL | Status: DC | PRN
  Administered 2014-03-09: 4 mL via TOPICAL

## 2014-03-09 MED ORDER — LIDOCAINE HCL (CARDIAC) 20 MG/ML IV SOLN
INTRAVENOUS | Status: DC | PRN
  Administered 2014-03-09: 80 mg via INTRAVENOUS

## 2014-03-09 MED ORDER — GLYCOPYRROLATE 0.2 MG/ML IJ SOLN
INTRAMUSCULAR | Status: DC | PRN
  Administered 2014-03-09: 0.2 mg via INTRAVENOUS

## 2014-03-09 MED ORDER — MENTHOL 3 MG MT LOZG: 1.0000 | LOZENGE | OROMUCOSAL | Status: DC | PRN

## 2014-03-09 MED ORDER — FENTANYL CITRATE 0.05 MG/ML IJ SOLN
INTRAMUSCULAR | Status: AC
  Filled 2014-03-09: qty 5

## 2014-03-09 MED ORDER — DEXTROSE-NACL 5-0.45 % IV SOLN
100.0000 mL/h | INTRAVENOUS | Status: DC
  Administered 2014-03-09: 100 mL/h via INTRAVENOUS

## 2014-03-09 MED ORDER — HYDROMORPHONE HCL PF 1 MG/ML IJ SOLN
1.0000 mg | INTRAMUSCULAR | Status: DC | PRN
  Administered 2014-03-09 – 2014-03-10 (×2): 1 mg via INTRAVENOUS
  Filled 2014-03-09 (×2): qty 1

## 2014-03-09 MED ORDER — ASPIRIN EC 325 MG PO TBEC
325.0000 mg | DELAYED_RELEASE_TABLET | Freq: Every day | ORAL | Status: DC
  Administered 2014-03-10 – 2014-03-15 (×6): 325 mg via ORAL
  Filled 2014-03-09 (×7): qty 1

## 2014-03-09 MED ORDER — ONDANSETRON HCL 4 MG/2ML IJ SOLN: 4.0000 mg | Freq: Once | INTRAMUSCULAR | Status: DC | PRN

## 2014-03-09 MED ORDER — HYDROMORPHONE HCL PF 1 MG/ML IJ SOLN
1.0000 mg | Freq: Once | INTRAMUSCULAR | Status: AC
Start: 2014-03-09 — End: 2014-03-09
  Administered 2014-03-09: 1 mg via INTRAVENOUS
  Filled 2014-03-09: qty 1

## 2014-03-09 MED ORDER — ASPIRIN EC 325 MG PO TBEC: 325.0000 mg | DELAYED_RELEASE_TABLET | Freq: Every day | ORAL | Status: DC

## 2014-03-09 MED ORDER — FENTANYL CITRATE 0.05 MG/ML IJ SOLN
INTRAMUSCULAR | Status: DC | PRN
  Administered 2014-03-09 (×3): 50 ug via INTRAVENOUS
  Administered 2014-03-09: 100 ug via INTRAVENOUS

## 2014-03-09 MED ORDER — ONDANSETRON HCL 4 MG/2ML IJ SOLN: 4.0000 mg | Freq: Four times a day (QID) | INTRAMUSCULAR | Status: DC | PRN

## 2014-03-09 MED ORDER — HYDROCODONE-ACETAMINOPHEN 5-325 MG PO TABS
1.0000 | ORAL_TABLET | Freq: Four times a day (QID) | ORAL | Status: DC | PRN
  Administered 2014-03-09 – 2014-03-10 (×3): 1 via ORAL
  Filled 2014-03-09 (×3): qty 1

## 2014-03-09 MED ORDER — DOCUSATE SODIUM 100 MG PO CAPS: 100.0000 mg | ORAL_CAPSULE | Freq: Two times a day (BID) | ORAL | Status: AC

## 2014-03-09 MED ORDER — PROPOFOL 10 MG/ML IV BOLUS
INTRAVENOUS | Status: DC | PRN
  Administered 2014-03-09: 90 mg via INTRAVENOUS

## 2014-03-09 MED ORDER — ESMOLOL HCL 10 MG/ML IV SOLN
INTRAVENOUS | Status: DC | PRN
  Administered 2014-03-09: 20 mg via INTRAVENOUS

## 2014-03-09 MED ORDER — HYDROCODONE-ACETAMINOPHEN 5-325 MG PO TABS: 1.0000 | ORAL_TABLET | Freq: Four times a day (QID) | ORAL | Status: DC | PRN

## 2014-03-09 MED ORDER — ONDANSETRON HCL 4 MG PO TABS: 4.0000 mg | ORAL_TABLET | Freq: Four times a day (QID) | ORAL | Status: DC | PRN

## 2014-03-09 MED ORDER — LIDOCAINE HCL (CARDIAC) 20 MG/ML IV SOLN
INTRAVENOUS | Status: AC
  Filled 2014-03-09: qty 5

## 2014-03-09 MED ORDER — INFLUENZA VAC SPLIT QUAD 0.5 ML IM SUSP
0.5000 mL | INTRAMUSCULAR | Status: DC
  Filled 2014-03-09: qty 0.5

## 2014-03-09 MED ORDER — ACETAMINOPHEN 325 MG PO TABS
650.0000 mg | ORAL_TABLET | Freq: Four times a day (QID) | ORAL | Status: DC | PRN
  Filled 2014-03-09: qty 2

## 2014-03-09 MED ORDER — NEOSTIGMINE METHYLSULFATE 1 MG/ML IJ SOLN
INTRAMUSCULAR | Status: DC | PRN
Start: 2014-03-09 — End: 2014-03-09
  Administered 2014-03-09: 2 mg via INTRAVENOUS

## 2014-03-09 MED ORDER — ONDANSETRON HCL 4 MG/2ML IJ SOLN
INTRAMUSCULAR | Status: DC | PRN
  Administered 2014-03-09: 4 mg via INTRAVENOUS

## 2014-03-09 MED ORDER — ROCURONIUM BROMIDE 50 MG/5ML IV SOLN
INTRAVENOUS | Status: AC
  Filled 2014-03-09: qty 1

## 2014-03-09 MED ORDER — METOCLOPRAMIDE HCL 5 MG/ML IJ SOLN: 5.0000 mg | Freq: Three times a day (TID) | INTRAMUSCULAR | Status: DC | PRN

## 2014-03-09 MED ORDER — ONDANSETRON HCL 4 MG/2ML IJ SOLN
INTRAMUSCULAR | Status: AC
  Filled 2014-03-09: qty 2

## 2014-03-09 MED ORDER — ACETAMINOPHEN 500 MG PO TABS: 1000.0000 mg | ORAL_TABLET | Freq: Once | ORAL | Status: DC

## 2014-03-09 MED ORDER — ACETAMINOPHEN 650 MG RE SUPP: 650.0000 mg | Freq: Four times a day (QID) | RECTAL | Status: DC | PRN

## 2014-03-09 MED ORDER — PHENYLEPHRINE HCL 10 MG/ML IJ SOLN
INTRAMUSCULAR | Status: DC | PRN
  Administered 2014-03-09 (×2): 80 ug via INTRAVENOUS

## 2014-03-09 MED ORDER — HYDROMORPHONE HCL PF 1 MG/ML IJ SOLN: 1.0000 mg | Freq: Once | INTRAMUSCULAR | Status: AC

## 2014-03-09 MED ORDER — SODIUM CHLORIDE 0.9 % IV SOLN
INTRAVENOUS | Status: DC
  Administered 2014-03-10: 75 mL/h via INTRAVENOUS
  Administered 2014-03-11: 03:00:00 via INTRAVENOUS

## 2014-03-09 MED ORDER — LORAZEPAM 2 MG/ML IJ SOLN
1.0000 mg | Freq: Once | INTRAMUSCULAR | Status: AC
  Administered 2014-03-09: 1 mg via INTRAVENOUS
  Filled 2014-03-09: qty 1

## 2014-03-09 MED ORDER — HYDROMORPHONE HCL PF 1 MG/ML IJ SOLN
INTRAMUSCULAR | Status: AC
  Filled 2014-03-09: qty 1

## 2014-03-09 MED ORDER — PROPOFOL 10 MG/ML IV BOLUS
INTRAVENOUS | Status: AC
  Filled 2014-03-09: qty 20

## 2014-03-09 MED ORDER — ROCURONIUM BROMIDE 100 MG/10ML IV SOLN
INTRAVENOUS | Status: DC | PRN
  Administered 2014-03-09: 20 mg via INTRAVENOUS

## 2014-03-09 MED ORDER — LACTATED RINGERS IV SOLN
INTRAVENOUS | Status: DC
  Administered 2014-03-09: 13:00:00 via INTRAVENOUS

## 2014-03-09 MED ORDER — CHLORHEXIDINE GLUCONATE 4 % EX LIQD
60.0000 mL | Freq: Once | CUTANEOUS | Status: AC
  Administered 2014-03-09: 4 via TOPICAL
  Filled 2014-03-09: qty 60

## 2014-03-09 MED ORDER — PHENOL 1.4 % MT LIQD: 1.0000 | OROMUCOSAL | Status: DC | PRN

## 2014-03-09 MED ORDER — GLYCOPYRROLATE 0.2 MG/ML IJ SOLN
INTRAMUSCULAR | Status: AC
  Filled 2014-03-09: qty 2

## 2014-03-09 MED ORDER — METOCLOPRAMIDE HCL 10 MG PO TABS: 5.0000 mg | ORAL_TABLET | Freq: Three times a day (TID) | ORAL | Status: DC | PRN

## 2014-03-09 MED ORDER — 0.9 % SODIUM CHLORIDE (POUR BTL) OPTIME
TOPICAL | Status: DC | PRN
  Administered 2014-03-09: 1000 mL

## 2014-03-09 SURGICAL SUPPLY — 55 items
BLADE SAGITTAL 25.0X1.27X90 (BLADE) ×2 IMPLANT
BLADE SAGITTAL 25.0X1.27X90MM (BLADE) ×1
CLOSURE WOUND 1/2 X4 (GAUZE/BANDAGES/DRESSINGS) ×1
COVER BACK TABLE 24X17X13 BIG (DRAPES) IMPLANT
DRAPE INCISE IOBAN 85X60 (DRAPES) IMPLANT
DRAPE ORTHO SPLIT 77X108 STRL (DRAPES) ×4
DRAPE SURG ORHT 6 SPLT 77X108 (DRAPES) ×2 IMPLANT
DRAPE U-SHAPE 47X51 STRL (DRAPES) ×3 IMPLANT
DRILL BIT 5/64 (BIT) ×3 IMPLANT
DRILL BIT 7/64X5 (BIT) IMPLANT
DRSG ADAPTIC 3X8 NADH LF (GAUZE/BANDAGES/DRESSINGS) IMPLANT
DRSG AQUACEL AG ADV 3.5X10 (GAUZE/BANDAGES/DRESSINGS) ×3 IMPLANT
DRSG MEPILEX BORDER 4X8 (GAUZE/BANDAGES/DRESSINGS) IMPLANT
DURAPREP 26ML APPLICATOR (WOUND CARE) ×3 IMPLANT
ELECT CAUTERY BLADE 6.4 (BLADE) ×3 IMPLANT
ELECT REM PT RETURN 9FT ADLT (ELECTROSURGICAL) ×3
ELECTRODE REM PT RTRN 9FT ADLT (ELECTROSURGICAL) ×1 IMPLANT
FACESHIELD LNG OPTICON STERILE (SAFETY) ×6 IMPLANT
GLOVE BIO SURGEON STRL SZ 6.5 (GLOVE) ×4 IMPLANT
GLOVE BIO SURGEON STRL SZ7.5 (GLOVE) ×6 IMPLANT
GLOVE BIO SURGEONS STRL SZ 6.5 (GLOVE) ×2
GLOVE BIOGEL PI IND STRL 7.0 (GLOVE) ×3 IMPLANT
GLOVE BIOGEL PI IND STRL 8 (GLOVE) ×1 IMPLANT
GLOVE BIOGEL PI INDICATOR 7.0 (GLOVE) ×6
GLOVE BIOGEL PI INDICATOR 8 (GLOVE) ×2
GLOVE ECLIPSE 6.5 STRL STRAW (GLOVE) ×3 IMPLANT
GOWN STRL REUS W/ TWL LRG LVL3 (GOWN DISPOSABLE) ×1 IMPLANT
GOWN STRL REUS W/ TWL XL LVL3 (GOWN DISPOSABLE) ×2 IMPLANT
GOWN STRL REUS W/TWL LRG LVL3 (GOWN DISPOSABLE) ×2
GOWN STRL REUS W/TWL XL LVL3 (GOWN DISPOSABLE) ×4
HANDPIECE INTERPULSE COAX TIP (DISPOSABLE)
HIP HEMIARTHROPLASTY LEV 1A ×3 IMPLANT
KIT BASIN OR (CUSTOM PROCEDURE TRAY) ×3 IMPLANT
KIT ROOM TURNOVER OR (KITS) ×3 IMPLANT
MANIFOLD NEPTUNE II (INSTRUMENTS) ×3 IMPLANT
NS IRRIG 1000ML POUR BTL (IV SOLUTION) ×3 IMPLANT
PACK TOTAL JOINT (CUSTOM PROCEDURE TRAY) ×3 IMPLANT
PAD ARMBOARD 7.5X6 YLW CONV (MISCELLANEOUS) ×6 IMPLANT
PILLOW ABDUCTION HIP (SOFTGOODS) ×3 IMPLANT
RETRIEVER SUT HEWSON (MISCELLANEOUS) ×3 IMPLANT
SET HNDPC FAN SPRY TIP SCT (DISPOSABLE) IMPLANT
STRIP CLOSURE SKIN 1/2X4 (GAUZE/BANDAGES/DRESSINGS) ×2 IMPLANT
SUT FIBERWIRE #2 38 REV NDL BL (SUTURE) ×6
SUT MNCRL AB 4-0 PS2 18 (SUTURE) ×3 IMPLANT
SUT MON AB 2-0 CT1 36 (SUTURE) ×3 IMPLANT
SUT VIC AB 0 CT1 27 (SUTURE) ×4
SUT VIC AB 0 CT1 27XBRD ANBCTR (SUTURE) ×2 IMPLANT
SUT VIC AB 2-0 CT1 27 (SUTURE) ×4
SUT VIC AB 2-0 CT1 TAPERPNT 27 (SUTURE) ×2 IMPLANT
SUTURE FIBERWR#2 38 REV NDL BL (SUTURE) ×2 IMPLANT
TOWEL OR 17X24 6PK STRL BLUE (TOWEL DISPOSABLE) ×3 IMPLANT
TOWEL OR 17X26 10 PK STRL BLUE (TOWEL DISPOSABLE) ×3 IMPLANT
TOWEL OR NON WOVEN STRL DISP B (DISPOSABLE) IMPLANT
TRAY FOLEY CATH 14FR (SET/KITS/TRAYS/PACK) IMPLANT
WATER STERILE IRR 1000ML POUR (IV SOLUTION) IMPLANT

## 2014-03-09 NOTE — Clinical Documentation Improvement (Signed)
Possible Clinical Conditions?  (Please document the diagnosis in the progress notes under your assessment if you agree with the below supporting information. ) _______Avascular necrosis _______Other Condition__________________ _______Cannot Clinically Determine   Supporting Information:  H&P(View-Onl 03/09/2014   HPI:  FINDINGS Severe deformity of left femoral head is noted consistent with avascular necrosis.  IMPRESSION Severe deformity of left femoral head is noted consistent with avascular necrosis and subsequent degenerative joint disease of the left hip.  Consult Note 03/09/2014   HPI:  FINDINGS Severe deformity of left femoral head is noted consistent with avascular necrosis.  IMPRESSION Severe deformity of left femoral head is noted consistent with avascular necrosis and subsequent degenerative joint disease of the left hip.  PROGRESS 03/08/2014   Studies/Results:  FINDINGS Severe deformity of left femoral head is noted consistent with avascular necrosis.  IMPRESSION Severe deformity of left femoral head is noted consistent with avascular necrosis and subsequent degenerative joint disease of the left hip.  H&P 03/08/2014   Radiological Exams on Admission:  FINDINGS Severe deformity of left femoral head is noted consistent with avascular necrosis.  IMPRESSION Severe deformity of left femoral head is noted consistent with avascular necrosis and subsequent degenerative joint disease of the left hip.  DG HIP COMPLETE LEFT 03/07/2014    FINDINGS Severe deformity of left femoral head is noted consistent withavascular necrosis.  IMPRESSION Severe deformity of left femoral head is noted consistent withavascular necrosis and subsequent degenerative joint disease of the left hip.  ED Prov Note 03/08/2014   HENT:  FINDINGS Severe deformity of left femoral head is noted consistent with avascular necrosis.  IMPRESSION Severe deformity of left femoral head is noted consistent with avascular  necrosis and subsequent degenerative joint disease of the left hip.  X-ray shows avascular necrosis of left hip with question of fracture.  Thank You, Serena Colonel ,RN Clinical Documentation Specialist [employer] [employer]

## 2014-03-09 NOTE — Interval H&P Note (Signed)
History and Physical Interval Note:  03/09/2014 7:43 AM  Michelle Yoder  has presented today for surgery, with the diagnosis of Fractured left hip  The various methods of treatment have been discussed with the patient and family. After consideration of risks, benefits and other options for treatment, the patient has consented to  Procedure(s): ARTHROPLASTY BIPOLAR HIP (Left) as a surgical intervention .  The patient's history has been reviewed, patient examined, no change in status, stable for surgery.  I have reviewed the patient's chart and labs.  Questions were answered to the patient's satisfaction.     Rashika Bettes, D

## 2014-03-09 NOTE — Transfer of Care (Signed)
Immediate Anesthesia Transfer of Care Note  Patient: Michelle Yoder  Procedure(s) Performed: Procedure(s): ARTHROPLASTY BIPOLAR HIP (Left)  Patient Location: PACU  Anesthesia Type:General  Level of Consciousness: awake, alert  and oriented  Airway & Oxygen Therapy: Patient Spontanous Breathing and Patient connected to nasal cannula oxygen  Post-op Assessment: Report given to PACU RN and Post -op Vital signs reviewed and stable  Post vital signs: Reviewed and stable  Complications: No apparent anesthesia complications

## 2014-03-09 NOTE — Interval H&P Note (Signed)
History and Physical Interval Note:  03/09/2014 12:52 PM  Michelle Yoder  has presented today for surgery, with the diagnosis of Fractured left hip  The various methods of treatment have been discussed with the patient and family. After consideration of risks, benefits and other options for treatment, the patient has consented to  Procedure(s): ARTHROPLASTY BIPOLAR HIP (Left) as a surgical intervention .  The patient's history has been reviewed, patient examined, no change in status, stable for surgery.  I have reviewed the patient's chart and labs.  Questions were answered to the patient's satisfaction.     Mulan Adan, D

## 2014-03-09 NOTE — H&P (View-Only) (Signed)
   ORTHOPAEDIC CONSULTATION  REQUESTING PHYSICIAN: Ripudeep K Rai, MD  Chief Complaint: Left hip fracture  HPI: Michelle Yoder is a 80 y.o. female who complains of  Left femoral neck fracture  Past Medical History  Diagnosis Date  . Arthritis   . Gout   . Osteoarthritis   . Diabetes mellitus without complication   . Hypertension   . Dementia   . Cancer   . Anemia   . GERD (gastroesophageal reflux disease)    Past Surgical History  Procedure Laterality Date  . Mastectomy    . Abdominal hysterectomy     History   Social History  . Marital Status: Widowed    Spouse Name: N/A    Number of Children: N/A  . Years of Education: N/A   Social History Main Topics  . Smoking status: Former Smoker  . Smokeless tobacco: None  . Alcohol Use: No  . Drug Use: No  . Sexual Activity: None   Other Topics Concern  . None   Social History Narrative  . None   No family history on file. No Known Allergies Prior to Admission medications   Medication Sig Start Date End Date Taking? Authorizing Provider  aspirin 81 MG chewable tablet Chew 81 mg by mouth daily.   Yes Historical Provider, MD  B Complex Vitamins (VITAMIN B COMPLEX PO) Take 1 capsule by mouth daily.   Yes Historical Provider, MD  Cholecalciferol (VITAMIN D-3) 1000 UNITS CAPS Take 1 capsule by mouth daily.   Yes Historical Provider, MD  furosemide (LASIX) 20 MG tablet Take 20 mg by mouth daily.   Yes Historical Provider, MD  gabapentin (NEURONTIN) 100 MG capsule Take 100 mg by mouth 3 (three) times daily.   Yes Historical Provider, MD  lansoprazole (PREVACID) 30 MG capsule Take 30 mg by mouth daily at 12 noon.   Yes Historical Provider, MD  leflunomide (ARAVA) 20 MG tablet Take 20 mg by mouth daily.   Yes Historical Provider, MD  OVER THE COUNTER MEDICATION Take 1 tablet by mouth daily. Medication name: Therapy M9mg-400mcg tablet   Yes Historical Provider, MD  predniSONE (DELTASONE) 2.5 MG tablet Take 2.5 mg by  mouth 3 (three) times daily. Take 2 tablets in the morning and 1 tablet at bed time.   Yes Historical Provider, MD  simethicone (MI-ACID GAS RELIEF) 80 MG chewable tablet Chew 80 mg by mouth 2 (two) times daily.   Yes Historical Provider, MD  sitaGLIPtin (JANUVIA) 50 MG tablet Take 50 mg by mouth daily.   Yes Historical Provider, MD  traMADol (ULTRAM-ER) 100 MG 24 hr tablet Take 100 mg by mouth daily.   Yes Historical Provider, MD   Dg Lumbar Spine Complete  03/08/2014   CLINICAL DATA Low back pain, no known injury.  EXAM LUMBAR SPINE - COMPLETE 4+ VIEW  COMPARISON None.  FINDINGS Multilevel degenerative changes with osteophyte formation and disc height loss. Diffuse osteopenia. There is mild superior endplate height loss at L5. Maintained vertebral body alignment. Atherosclerotic vascular calcifications. Lower lumbar/lumbosacral facet arthropathy.  IMPRESSION Multilevel degenerative changes. Mild superior endplate height loss at L5 is age indeterminate. Correlate for point tenderness.  SIGNATURE  Electronically Signed   By: Andrew  DelGaizo M.D.   On: 03/08/2014 01:34   Dg Hip Complete Left  03/08/2014   CLINICAL DATA Left hip pain.  EXAM LEFT HIP - COMPLETE 2+ VIEW  COMPARISON None.  FINDINGS Severe deformity of left femoral head is noted consistent with avascular necrosis.   Subsequent degenerative joint disease of the left hip is noted. There is noted some lucency in the region of the left femoral neck which may represent fracture.  IMPRESSION Severe deformity of left femoral head is noted consistent with avascular necrosis and subsequent degenerative joint disease of the left hip. Curvilinear lucency is seen below the left femoral head which may suggest fracture ; CT scan of the hip is recommended for further evaluation.  SIGNATURE  Electronically Signed   By: James  Green M.D.   On: 03/08/2014 01:30   Ct Hip Left Wo Contrast  03/08/2014   CLINICAL DATA Left hip pain and left upper leg pain for 2  weeks.  EXAM CT OF THE LEFT HIP WITHOUT CONTRAST  TECHNIQUE Multidetector CT imaging was performed according to the standard protocol. Multiplanar CT image reconstructions were also generated.  COMPARISON DG HIP COMPLETE*L* dated 03/08/2014  FINDINGS Comminuted acute to subacute appearing fracture of the femoral head with impaction and sclerotic femoral head fragments. The fracture extends from the articular surface to the femoral head neck junction. No dislocation.  Subcentimeter apparent loose bodies extending into the iliopsoas bursa.  Bone mineral density is decreased with enthesopathy at greater trochanter. No destructive bony lesions. Limited view of the pelvis demonstrates sigmoid diverticulosis and phleboliths.  IMPRESSION Comminuted femoral subcapital femur fracture extending through the head to the neck junction with impaction. Findings may reflect subacute insufficiency fracture. No dislocation.  Please note, mildly sclerotic fracture fragments may reflect impaction, though are nonspecific. This is highly unlikely to reflect pathologic fracture.  SIGNATURE  Electronically Signed   By: Courtnay  Bloomer   On: 03/08/2014 02:50    Positive ROS: All other systems have been reviewed and were otherwise negative with the exception of those mentioned in the HPI and as above.  Labs cbc  Recent Labs  03/08/14 0135  WBC 8.4  HGB 10.7*  HCT 33.0*  PLT PLATELET CLUMPS NOTED ON SMEAR, COUNT APPEARS ADEQUATE    Labs inflam No results found for this basename: ESR, CRP,  in the last 72 hours  Labs coag  Recent Labs  03/08/14 0710  INR 0.87     Recent Labs  03/08/14 0135  NA 139  K 3.7  CL 99  CO2 27  GLUCOSE 115*  BUN 38*  CREATININE 1.37*  CALCIUM 9.0    Physical Exam: Filed Vitals:   03/08/14 1514  BP:   Pulse:   Temp:   Resp: 20   General: Alert, no acute distress Cardiovascular: No pedal edema Respiratory: No cyanosis, no use of accessory musculature GI: No  organomegaly, abdomen is soft and non-tender Skin: No lesions in the area of chief complaint Neurologic: Sensation intact distally Lymphatic: No axillary or cervical lymphadenopathy  MUSCULOSKELETAL:  LLE: Pain with Log roll, wiggles toes, WWP Other extremities are atraumatic with painless ROM and NVI.  Assessment: Left femoral neck fracture  Plan: Left hip hemiarthroplasty on 3/12 Hold chemical dvt px for OR Weight Bearing Status: Bedrest for now, WBAT post op PT VTE px: SCD's and chemical post op, likely ASA   Anaka Beazer, D, MD Cell [phone]   03/08/2014 5:19 PM     

## 2014-03-09 NOTE — Progress Notes (Signed)
Patient ID: Phila Shoaf  female  UYQ:[id number]    DOB: [birth year]    DOA: 03/07/2014  PCP: No PCP Per Patient  Assessment/Plan: Principal Problem:   Hip fracture, left , fall 3 weeks ago - Planned surgery today for left hip arthroplasty  - Continue pain control  Active Problems:   Diabetes mellitus without complication - Placed on sliding scale insulin, continue carb modified diet, n.p.o. after midnight    Dementia Currently living in assisted living facility, will likely need rehabilitation after the surgery     Hypertension - Currently stable    RA (rheumatoid arthritis): Continue Arava and prednisone    DVT Prophylaxis: SCDs   Code Status: full code   Family Communication:  Disposition: will likely need skilled nursing facility at the time of dc  Consultants:  Orthopedics   Procedures:  None    Antibiotics:  Subjective: Patient alert and awake, somewhat confused, complaining of pain in her hip  Objective: Weight change:   Intake/Output Summary (Last 24 hours) at 03/09/14 1257 Last data filed at 03/09/14 0713  Gross per 24 hour  Intake    120 ml  Output   1250 ml  Net  -1130 ml   Blood pressure 149/62, pulse 96, temperature 98.7 F (37.1 C), temperature source Oral, resp. rate 18, height 5\' 4"  (1.626 m), weight 60.5 kg (133 lb 6.1 oz), SpO2 93.00%.  Physical Exam: General: Alert and awake, confused NAD CVS: S1-S2 clear, no murmur rubs or gallops Chest: clear to auscultation bilaterally, no wheezing, rales or rhonchi Abdomen: soft nontender, nondistended, normal bowel sounds  Extremities: no cyanosis, clubbing or edema noted bilaterally, pain with rotation of left hip   Lab Results: Basic Metabolic Panel:  Recent Labs Lab 03/08/14 0135  NA 139  K 3.7  CL 99  CO2 27  GLUCOSE 115*  BUN 38*  CREATININE 1.37*  CALCIUM 9.0   Liver Function Tests:  Recent Labs Lab 03/08/14 0135  AST 37  ALT 30  ALKPHOS 229*  BILITOT 0.4  PROT  6.4  ALBUMIN 2.4*   No results found for this basename: LIPASE, AMYLASE,  in the last 168 hours No results found for this basename: AMMONIA,  in the last 168 hours CBC:  Recent Labs Lab 03/08/14 0135  WBC 8.4  NEUTROABS 5.7  HGB 10.7*  HCT 33.0*  MCV 94.0  PLT PLATELET CLUMPS NOTED ON SMEAR, COUNT APPEARS ADEQUATE   Cardiac Enzymes: No results found for this basename: CKTOTAL, CKMB, CKMBINDEX, TROPONINI,  in the last 168 hours BNP: No components found with this basename: POCBNP,  CBG:  Recent Labs Lab 03/08/14 1659 03/08/14 2103 03/09/14 0621 03/09/14 1157  GLUCAP 122* 97 156* 105*     Micro Results: Recent Results (from the past 240 hour(s))  MRSA PCR SCREENING     Status: None   Collection Time    03/09/14  5:17 AM      Result Value Ref Range Status   MRSA by PCR NEGATIVE  NEGATIVE Final   Comment:            The GeneXpert MRSA Assay (FDA     approved for NASAL specimens     only), is one component of a     comprehensive MRSA colonization     surveillance program. It is not     intended to diagnose MRSA     infection nor to guide or     monitor treatment for     MRSA  infections.    Studies/Results: Dg Lumbar Spine Complete  03/08/2014   CLINICAL DATA Low back pain, no known injury.  EXAM LUMBAR SPINE - COMPLETE 4+ VIEW  COMPARISON None.  FINDINGS Multilevel degenerative changes with osteophyte formation and disc height loss. Diffuse osteopenia. There is mild superior endplate height loss at L5. Maintained vertebral body alignment. Atherosclerotic vascular calcifications. Lower lumbar/lumbosacral facet arthropathy.  IMPRESSION Multilevel degenerative changes. Mild superior endplate height loss at L5 is age indeterminate. Correlate for point tenderness.  SIGNATURE  Electronically Signed   By: Carlos Levering M.D.   On: 03/08/2014 01:34   Dg Hip Complete Left  03/08/2014   CLINICAL DATA Left hip pain.  EXAM LEFT HIP - COMPLETE 2+ VIEW  COMPARISON None.   FINDINGS Severe deformity of left femoral head is noted consistent with avascular necrosis. Subsequent degenerative joint disease of the left hip is noted. There is noted some lucency in the region of the left femoral neck which may represent fracture.  IMPRESSION Severe deformity of left femoral head is noted consistent with avascular necrosis and subsequent degenerative joint disease of the left hip. Curvilinear lucency is seen below the left femoral head which may suggest fracture ; CT scan of the hip is recommended for further evaluation.  SIGNATURE  Electronically Signed   By: Sabino Dick M.D.   On: 03/08/2014 01:30   Ct Hip Left Wo Contrast  03/08/2014   CLINICAL DATA Left hip pain and left upper leg pain for 2 weeks.  EXAM CT OF THE LEFT HIP WITHOUT CONTRAST  TECHNIQUE Multidetector CT imaging was performed according to the standard protocol. Multiplanar CT image reconstructions were also generated.  COMPARISON DG HIP COMPLETE*L* dated 03/08/2014  FINDINGS Comminuted acute to subacute appearing fracture of the femoral head with impaction and sclerotic femoral head fragments. The fracture extends from the articular surface to the femoral head neck junction. No dislocation.  Subcentimeter apparent loose bodies extending into the iliopsoas bursa.  Bone mineral density is decreased with enthesopathy at greater trochanter. No destructive bony lesions. Limited view of the pelvis demonstrates sigmoid diverticulosis and phleboliths.  IMPRESSION Comminuted femoral subcapital femur fracture extending through the head to the neck junction with impaction. Findings may reflect subacute insufficiency fracture. No dislocation.  Please note, mildly sclerotic fracture fragments may reflect impaction, though are nonspecific. This is highly unlikely to reflect pathologic fracture.  SIGNATURE  Electronically Signed   By: Elon Alas   On: 03/08/2014 02:50    Medications: Scheduled Meds: . acetaminophen  1,000 mg  Oral Once  . [facility] HOLD] aspirin  81 mg Oral Daily  .  ceFAZolin (ANCEF) IV  2 g Intravenous On Call to OR  . [facility] HOLD] cholecalciferol  1,000 Units Oral Daily  . [MAR HOLD] insulin aspart  0-5 Units Subcutaneous QHS  . [MAR HOLD] insulin aspart  0-9 Units Subcutaneous TID WC  . [facility] HOLD] leflunomide  20 mg Oral Daily  . [MAR HOLD] pantoprazole  40 mg Oral Daily  . [MAR HOLD] predniSONE  2.5 mg Oral TID  . [facility] HOLD] simethicone  80 mg Oral BID      LOS: 2 days   Shaquanta Harkless M.D. [facility] 03/09/2014, 12:57 PM Pager: [phone]  If 7PM-7AM, please contact night-coverage [email or web address] Password [email or web address]

## 2014-03-09 NOTE — Anesthesia Preprocedure Evaluation (Addendum)
Anesthesia Evaluation  Patient identified by MRN, date of birth, ID band Patient awake and Patient confused    Reviewed: Allergy & Precautions, H&P , NPO status , Patient's Chart, lab work & pertinent test results  Airway Mallampati: II TM Distance: >3 FB Neck ROM: Full    Dental  (+) Edentulous Upper   Pulmonary former smoker,          Cardiovascular hypertension, Pt. on medications     Neuro/Psych    GI/Hepatic GERD-  ,  Endo/Other  diabetes, Type 2, Oral Hypoglycemic Agents  Renal/GU      Musculoskeletal  (+) Arthritis -,   Abdominal   Peds  Hematology  (+) anemia , hgb 10   Anesthesia Other Findings   Reproductive/Obstetrics                         Anesthesia Physical Anesthesia Plan  ASA: III  Anesthesia Plan: General   Post-op Pain Management:    Induction: Intravenous  Airway Management Planned: Oral ETT  Additional Equipment:   Intra-op Plan:   Post-operative Plan: Extubation in OR  Informed Consent: I have reviewed the patients History and Physical, chart, labs and discussed the procedure including the risks, benefits and alternatives for the proposed anesthesia with the patient or authorized representative who has indicated his/her understanding and acceptance.   Dental advisory given  Plan Discussed with: Anesthesiologist and Surgeon  Anesthesia Plan Comments:         Anesthesia Quick Evaluation

## 2014-03-09 NOTE — Preoperative (Signed)
Beta Blockers   Reason not to administer Beta Blockers:Not Applicable 

## 2014-03-09 NOTE — Anesthesia Postprocedure Evaluation (Signed)
  Anesthesia Post-op Note  Patient: Michelle Yoder  Procedure(s) Performed: Procedure(s): ARTHROPLASTY BIPOLAR HIP (Left)  Patient Location: PACU  Anesthesia Type:General  Level of Consciousness: awake and alert   Airway and Oxygen Therapy: Patient Spontanous Breathing  Post-op Pain: mild  Post-op Assessment: Post-op Vital signs reviewed, Patient's Cardiovascular Status Stable, Respiratory Function Stable, Patent Airway, No signs of Nausea or vomiting and Pain level controlled  Post-op Vital Signs: Reviewed and stable  Complications: No apparent anesthesia complications

## 2014-03-09 NOTE — Progress Notes (Signed)
Care of pt assumed by MA Laila Myhre RN from S. Gregson RN 

## 2014-03-09 NOTE — Progress Notes (Signed)
Report given to maryann rn as caregiver 

## 2014-03-09 NOTE — Discharge Instructions (Signed)
Partial Hip Replacement, Care After Refer to this sheet in the next few weeks. These discharge instructions provide you with general information on caring for yourself after you leave the hospital. Your caregiver may also give you specific instructions. Your treatment has been planned according to the most current medical practices available, but unavoidable complications sometimes occur. If you have any problems or questions after discharge, please call your caregiver. HOME CARE INSTRUCTIONS   Weight bearing as tolerated.  Take Aspirin 1 tab a day for the next 30 days to prevent blood clots.  May change dressing daily starting this Sunday.  May take a shower on Tuesday, but do not soak incision.  May apply ice for up to 20 minutes at a time for pain.    Pain and Swelling  It is normal to have a little discomfort during activity and at night for a few weeks. Check with your caregiver if the pain worsens or does not go away as expected.  Rest your hip in a comfortable position as directed. You may be asked to use a pillow for support while sleeping and resting. If you are resting, try to move at least once every 2 hours.  Take medicines as directed. You may be given medicine for pain, prevention of blood clots, or to increase bowel movements. Wound Care  Keep your wound clean, dry, and protected.  You may bathe in a tub, shower, or swim when the wound area has fully healed. Try sponge bathing in the meantime.  Follow up with your caregiver as directed. You will need to have stitches (sutures) or staples removed. Diet  Eat a well-balanced diet rich in fruits and vegetables. Include iron-rich foods to promote healing and muscle strength.  Certain pain medicines may cause constipation. Drink enough water and fluids to keep your urine clear or pale yellow. Include additional fiber in your diet. SEEK MEDICAL CARE IF:   You feel unwell with chills.  You have severe or continued pain in the hip  (particularly when at rest).  You notice redness, soreness, or fluid (drainage) coming from the wound area.  You feel sick to your stomach (nauseous), dizzy, or throw up (vomit).  You feel burning pain down the leg.  You have other new symptoms.  You have questions or concerns. SEEK IMMEDIATE MEDICAL CARE IF:   You have a fever.  You have pain or swelling of the calf or leg.  You develop difficulty breathing.  You have an irregular heartbeat or chest pain. MAKE SURE YOU:   Understand these instructions.  Will watch your condition.  Will get help right away if you are not doing well or get worse. Document Released: 06/04/2010 Document Revised: 10/05/2013 Document Reviewed: 06/04/2010 [facility] Patient Information 2014 [place].

## 2014-03-09 NOTE — Op Note (Signed)
03/07/2014 - 03/09/2014  2:28 PM  PATIENT:  Michelle Yoder   MRN: [id number]  PRE-OPERATIVE DIAGNOSIS:  Fractured left hip  POST-OPERATIVE DIAGNOSIS:  Fractured left hip  PROCEDURE:  Procedure(s): ARTHROPLASTY BIPOLAR HIP  PREOPERATIVE INDICATIONS:  Michelle Yoder is an 80 y.o. female who was admitted 03/07/2014 with a diagnosis of Hip fracture, left and elected for surgical management.  The risks benefits and alternatives were discussed with the patient including but not limited to the risks of nonoperative treatment, versus surgical intervention including infection, bleeding, nerve injury, periprosthetic fracture, the need for revision surgery, dislocation, leg length discrepancy, blood clots, cardiopulmonary complications, morbidity, mortality, among others, and they were willing to proceed.  Predicted outcome is good, although there will be at least a six to nine month expected recovery.   OPERATIVE REPORT     SURGEON:  Edmonia Lynch, MD    ASSISTANT:  Doran Stabler PA    ANESTHESIA:  General    COMPLICATIONS:  None.      COMPONENTS:  [employer] Acolade: Femoral stem: 2.5, Femoral Head:46, Neck:0   PROCEDURE IN DETAIL: The patient was met in the holding area and identified.  The appropriate hip  was marked at the operative site. The patient was then transported to the OR and  placed under general anesthesia.  At that point, the patient was  placed in the lateral decubitus position with the operative side up and  secured to the operating room table and all bony prominences padded.     The operative lower extremity was prepped from the iliac crest to the toes.  Sterile draping was performed.  Time out was performed prior to incision.      A routine posterolateral approach was utilized via sharp dissection  carried down to the subcutaneous tissue.  Gross bleeders were Bovie  coagulated.  The iliotibial band was identified and incised  along the length of the skin incision.   Self-retaining retractors were  inserted.  With the hip internally rotated, the short external rotators  were identified. The piriformis was tagged with FiberWire, and the hip capsule released in a T-type fashion.  The femoral neck was exposed, and I resected the femoral neck using the appropriate jig. This was performed at approximately a thumb's breadth above the lesser trochanter.    I then exposed the deep acetabulum, cleared out any tissue including the ligamentum teres, and included the hip capsule in the FiberWire used above and below the T.    I then prepared the proximal femur using the cookie-cutter, the lateralizing reamer, and then sequentially broached.  A trial utilized, and I reduced the hip and it was found to have excellent stability with functional range of motion. The trial components were then removed.   The canal and acetabulum were thoroughly irrigated  I inserted the pressfit stem and placed the head and neck collar. The hip was reduced with appropriate force and was stable through a range of motion.   I then used a 2 mm drill bits to pass the FiberWire suture from the capsule and puriform is through the greater trochanter, and secured this. Excellent posterior capsular repair was achieved. I also closed the T in the capsule.  I then irrigated the hip copiously again with pulse lavage, and repaired the fascia with Vicryl, followed by Vicryl for the subcutaneous tissue, Monocryl for the skin, Steri-Strips and sterile gauze. The wounds were injected. The patient was then awakened and returned to PACU in stable and satisfactory condition. There  were no complications.  POST-OP PLAN: Weight bearing as tolerated. DVT px will consist of SCD's and ASA 325  Edmonia Lynch, MD Orthopedic Surgeon [phone]   03/09/2014 2:28 PM

## 2014-03-09 NOTE — Plan of Care (Signed)
Pt is screaming out wanting water to drink. Nurse swabbed pts mouth. Pt keeps stating I am thirsty. Help. Screaming out loud. Morphine given to pt. MD paged to get order for anti anxiety med. Waiting for MD to call back.

## 2014-03-09 NOTE — Plan of Care (Signed)
Report called to Shelton Silvas, RN at short stay.

## 2014-03-09 NOTE — Plan of Care (Signed)
Dr Christia Reading murphy called and md states to get in touch with hospitalist.

## 2014-03-10 DIAGNOSIS — I10 Essential (primary) hypertension: Secondary | ICD-10-CM

## 2014-03-10 DIAGNOSIS — E119 Type 2 diabetes mellitus without complications: Secondary | ICD-10-CM

## 2014-03-10 DIAGNOSIS — M069 Rheumatoid arthritis, unspecified: Secondary | ICD-10-CM

## 2014-03-10 DIAGNOSIS — S7290XA Unspecified fracture of unspecified femur, initial encounter for closed fracture: Secondary | ICD-10-CM

## 2014-03-10 DIAGNOSIS — F0390 Unspecified dementia without behavioral disturbance: Secondary | ICD-10-CM

## 2014-03-10 LAB — CBC
HEMATOCRIT: 29 % — AB (ref 36.0–46.0)
Hemoglobin: 9.3 g/dL — ABNORMAL LOW (ref 12.0–15.0)
MCH: 30.1 pg (ref 26.0–34.0)
MCHC: 32.1 g/dL (ref 30.0–36.0)
MCV: 93.9 fL (ref 78.0–100.0)
Platelets: 285 10*3/uL (ref 150–400)
RBC: 3.09 MIL/uL — ABNORMAL LOW (ref 3.87–5.11)
RDW: 14.7 % (ref 11.5–15.5)
WBC: 8.5 10*3/uL (ref 4.0–10.5)

## 2014-03-10 LAB — BASIC METABOLIC PANEL
BUN: 29 mg/dL — ABNORMAL HIGH (ref 6–23)
CALCIUM: 8.6 mg/dL (ref 8.4–10.5)
CO2: 23 mEq/L (ref 19–32)
Chloride: 99 mEq/L (ref 96–112)
Creatinine, Ser: 1.07 mg/dL (ref 0.50–1.10)
GFR calc Af Amer: 52 mL/min — ABNORMAL LOW (ref >90)
GFR calc non Af Amer: 45 mL/min — ABNORMAL LOW (ref >90)
GLUCOSE: 131 mg/dL — AB (ref 70–99)
Potassium: 4.4 mEq/L (ref 3.7–5.3)
Sodium: 137 mEq/L (ref 137–147)

## 2014-03-10 LAB — GLUCOSE, CAPILLARY
GLUCOSE-CAPILLARY: 104 mg/dL — AB (ref 70–99)
Glucose-Capillary: 119 mg/dL — ABNORMAL HIGH (ref 70–99)
Glucose-Capillary: 102 mg/dL — ABNORMAL HIGH (ref 70–99)
Glucose-Capillary: 108 mg/dL — ABNORMAL HIGH (ref 70–99)

## 2014-03-10 MED ORDER — LORAZEPAM 2 MG/ML IJ SOLN
0.5000 mg | Freq: Once | INTRAMUSCULAR | Status: AC
  Administered 2014-03-10: 0.5 mg via INTRAVENOUS
  Filled 2014-03-10: qty 1

## 2014-03-10 MED ORDER — LORAZEPAM 1 MG PO TABS: 1.0000 mg | ORAL_TABLET | Freq: Three times a day (TID) | ORAL | Status: DC | PRN

## 2014-03-10 MED ORDER — HYDROCODONE-ACETAMINOPHEN 5-325 MG PO TABS
1.0000 | ORAL_TABLET | Freq: Four times a day (QID) | ORAL | Status: DC | PRN
  Administered 2014-03-12 (×3): 1 via ORAL
  Filled 2014-03-10 (×3): qty 1

## 2014-03-10 NOTE — Progress Notes (Signed)
Patient ID: Michelle Yoder  female  OJJ:[id number]    DOB: December 15, 1923    DOA: 03/07/2014  PCP: No PCP Per Patient  Assessment/Plan: Principal Problem:   Hip fracture, left , fall 3 weeks ago -Status post bipolar left hip arthroplasty, postop day 1 - Continue pain control  Acute encephalopathy on dementia: Likely worsened due to medications, Robaxin, narcotics, worsening dementia and has hospital/ sundowning - I have discontinued Robaxin, minimize narcotics as tolerated - Will check urine analysis - Ativan as needed for agitation or sleep  Problems:   Diabetes mellitus without complication - Placed on sliding scale insulin, continue carb modified diet, n.p.o. after midnight     Hypertension - Currently stable    RA (rheumatoid arthritis): Continue Arava and prednisone    DVT Prophylaxis: SCDs   Code Status: full code   Family Communication:  Disposition: will likely need skilled nursing facility at the time of dc  Consultants:  Orthopedics   Procedures:  None    Antibiotics:  Subjective: Patient alert and awake, confused,  and not working with physical therapy, trying to pull the gown and the IV   Objective: Weight change:   Intake/Output Summary (Last 24 hours) at 03/10/14 1456 Last data filed at 03/10/14 1300  Gross per 24 hour  Intake   2090 ml  Output    275 ml  Net   1815 ml   Blood pressure 142/52, pulse 100, temperature 99.5 F (37.5 C), temperature source Oral, resp. rate 18, height 5\' 4"  (1.626 m), weight 60.5 kg (133 lb 6.1 oz), SpO2 94.00%.  Physical Exam: General: Alert and awake, confused , agitated CVS: S1-S2 clear, no murmur rubs or gallops Chest: clear to auscultation bilaterally, no wheezing, rales or rhonchi Abdomen: soft nontender, nondistended, normal bowel sounds  Extremities: no cyanosis, clubbing or edema noted bilaterally   Lab Results: Basic Metabolic Panel:  Recent Labs Lab 03/08/14 0135 03/10/14 0405  NA 139 137    K 3.7 4.4  CL 99 99  CO2 27 23  GLUCOSE 115* 131*  BUN 38* 29*  CREATININE 1.37* 1.07  CALCIUM 9.0 8.6   Liver Function Tests:  Recent Labs Lab 03/08/14 0135  AST 37  ALT 30  ALKPHOS 229*  BILITOT 0.4  PROT 6.4  ALBUMIN 2.4*   No results found for this basename: LIPASE, AMYLASE,  in the last 168 hours No results found for this basename: AMMONIA,  in the last 168 hours CBC:  Recent Labs Lab 03/08/14 0135 03/10/14 0405  WBC 8.4 8.5  NEUTROABS 5.7  --   HGB 10.7* 9.3*  HCT 33.0* 29.0*  MCV 94.0 93.9  PLT PLATELET CLUMPS NOTED ON SMEAR, COUNT APPEARS ADEQUATE 285   Cardiac Enzymes: No results found for this basename: CKTOTAL, CKMB, CKMBINDEX, TROPONINI,  in the last 168 hours BNP: No components found with this basename: POCBNP,  CBG:  Recent Labs Lab 03/09/14 1157 03/09/14 1516 03/09/14 2158 03/10/14 0618 03/10/14 1152  GLUCAP 105* 102* 134* 119* 102*     Micro Results: Recent Results (from the past 240 hour(s))  MRSA PCR SCREENING     Status: None   Collection Time    03/09/14  5:17 AM      Result Value Ref Range Status   MRSA by PCR NEGATIVE  NEGATIVE Final   Comment:            The GeneXpert MRSA Assay (FDA     approved for NASAL specimens  only), is one component of a     comprehensive MRSA colonization     surveillance program. It is not     intended to diagnose MRSA     infection nor to guide or     monitor treatment for     MRSA infections.    Studies/Results: Dg Lumbar Spine Complete  03/08/2014   CLINICAL DATA Low back pain, no known injury.  EXAM LUMBAR SPINE - COMPLETE 4+ VIEW  COMPARISON None.  FINDINGS Multilevel degenerative changes with osteophyte formation and disc height loss. Diffuse osteopenia. There is mild superior endplate height loss at L5. Maintained vertebral body alignment. Atherosclerotic vascular calcifications. Lower lumbar/lumbosacral facet arthropathy.  IMPRESSION Multilevel degenerative changes. Mild superior  endplate height loss at L5 is age indeterminate. Correlate for point tenderness.  SIGNATURE  Electronically Signed   By: Carlos Levering M.D.   On: 03/08/2014 01:34   Dg Hip Complete Left  03/08/2014   CLINICAL DATA Left hip pain.  EXAM LEFT HIP - COMPLETE 2+ VIEW  COMPARISON None.  FINDINGS Severe deformity of left femoral head is noted consistent with avascular necrosis. Subsequent degenerative joint disease of the left hip is noted. There is noted some lucency in the region of the left femoral neck which may represent fracture.  IMPRESSION Severe deformity of left femoral head is noted consistent with avascular necrosis and subsequent degenerative joint disease of the left hip. Curvilinear lucency is seen below the left femoral head which may suggest fracture ; CT scan of the hip is recommended for further evaluation.  SIGNATURE  Electronically Signed   By: Sabino Dick M.D.   On: 03/08/2014 01:30   Ct Hip Left Wo Contrast  03/08/2014   CLINICAL DATA Left hip pain and left upper leg pain for 2 weeks.  EXAM CT OF THE LEFT HIP WITHOUT CONTRAST  TECHNIQUE Multidetector CT imaging was performed according to the standard protocol. Multiplanar CT image reconstructions were also generated.  COMPARISON DG HIP COMPLETE*L* dated 03/08/2014  FINDINGS Comminuted acute to subacute appearing fracture of the femoral head with impaction and sclerotic femoral head fragments. The fracture extends from the articular surface to the femoral head neck junction. No dislocation.  Subcentimeter apparent loose bodies extending into the iliopsoas bursa.  Bone mineral density is decreased with enthesopathy at greater trochanter. No destructive bony lesions. Limited view of the pelvis demonstrates sigmoid diverticulosis and phleboliths.  IMPRESSION Comminuted femoral subcapital femur fracture extending through the head to the neck junction with impaction. Findings may reflect subacute insufficiency fracture. No dislocation.  Please  note, mildly sclerotic fracture fragments may reflect impaction, though are nonspecific. This is highly unlikely to reflect pathologic fracture.  SIGNATURE  Electronically Signed   By: Elon Alas   On: 03/08/2014 02:50    Medications: Scheduled Meds: . aspirin EC  325 mg Oral Q breakfast  . cholecalciferol  1,000 Units Oral Daily  . influenza vac split quadrivalent PF  0.5 mL Intramuscular Tomorrow-1000  . insulin aspart  0-5 Units Subcutaneous QHS  . insulin aspart  0-9 Units Subcutaneous TID WC  . leflunomide  20 mg Oral Daily  . pantoprazole  40 mg Oral Daily  . predniSONE  2.5 mg Oral TID  . simethicone  80 mg Oral BID      LOS: 3 days   Teosha Casso M.D. [facility] 03/10/2014, 2:56 PM Pager: [phone]  If 7PM-7AM, please contact night-coverage [email or web address] Password [email or web address]

## 2014-03-10 NOTE — Progress Notes (Signed)
Clinical Social Work Department BRIEF PSYCHOSOCIAL ASSESSMENT 03/10/2014  Patient:  Michelle Yoder, Michelle Yoder     Account Number:  [id number]     Admit date:  03/07/2014  Clinical Social Worker:  Adair Laundry  Date/Time:  03/10/2014 12:00 N  Referred by:  Physician  Date Referred:  03/10/2014 Referred for  SNF Placement   Other Referral:   Interview type:  Family Other interview type:   Spoke with multiple family members at bedside    PSYCHOSOCIAL DATA Living Status:  [place] Admitted from facility:  OTHER Level of care:  Assisted Living Primary support name:  Michelle Yoder [phone] Primary support relationship to patient:  CHILD, ADULT Degree of support available:   Pt has very supportive family    CURRENT CONCERNS Current Concerns  Post-Acute Placement   Other Concerns:    SOCIAL WORK ASSESSMENT / PLAN CSW informed that pt family is hoping to speak with CSW about SNF. CSW spoke with pt family (daughters and son-in-laws) at bedside. Pt was living at an ALF in [place], [place] and the family was tranporting pt to [place] to be closer to them when pt was admitted to the hospital. At this time, pt family does not believe pt will be safe or appropriate to dc to [place] and instead are hoping for pt to dc to SNF. CSW explained referral process. Pt family has a strong preference for [name] but are very agreeable to full county search. Pt family was very thankful for CSW involvement and help with placement as this has been an overwhelming year for them. Pt husband passed away a little over a year ago and one of pt daughters has been battling cancer. The family expressed how much they appriciate the staff at [facility].   Assessment/plan status:  Psychosocial Support/Ongoing Assessment of Needs Other assessment/ plan:   Information/referral to community resources:   SNF list to be provided with bed offers    PATIENT'S/FAMILY'S RESPONSE TO PLAN OF CARE: Pt family is hopeful pt will be  able to dc to SNF       [place], [place]

## 2014-03-10 NOTE — Evaluation (Signed)
Physical Therapy Evaluation Patient Details Name: Michelle Yoder MRN: [id number] DOB: 1923/12/17 Today's Date: 03/10/2014 Time: 6812-7517 PT Time Calculation (min): 31 min  PT Assessment / Plan / Recommendation History of Present Illness  pt presents with hip fx s/p Arthroplasty.    Clinical Impression  Pt wet with urine in bed and confusion limits ability to participate in bed mobility and hygiene.  Pt at times gets combative with staff despite attempts at orienting to situation.  At this time pt would benefit from SNF level of care.  Will continue to follow.      PT Assessment  Patient needs continued PT services    Follow Up Recommendations  SNF    Does the patient have the potential to tolerate intense rehabilitation      Barriers to Discharge        Equipment Recommendations  None recommended by PT    Recommendations for Other Services     Frequency Min 3X/week    Precautions / Restrictions Precautions Precautions: Fall Restrictions Weight Bearing Restrictions: Yes LLE Weight Bearing: Weight bearing as tolerated   Pertinent Vitals/Pain Pt yells out at times, per RN premedicated.        Mobility  Bed Mobility Overal bed mobility: Needs Assistance Bed Mobility: Rolling Rolling: Max assist;+2 for physical assistance General bed mobility comments: Max cueing and facilitation for bed mobility. however pt confused and at times combative.  Needed to roll multiple times for peri hygiene.      Exercises     PT Diagnosis: Difficulty walking;Generalized weakness;Acute pain;Altered mental status  PT Problem List: Decreased strength;Decreased activity tolerance;Decreased balance;Decreased mobility;Decreased coordination;Decreased cognition;Decreased knowledge of use of DME;Decreased safety awareness;Pain PT Treatment Interventions: DME instruction;Gait training;Functional mobility training;Therapeutic activities;Therapeutic exercise;Balance training;Patient/family  education;Cognitive remediation     PT Goals(Current goals can be found in the care plan section) Acute Rehab PT Goals Patient Stated Goal: None stated PT Goal Formulation: Patient unable to participate in goal setting Time For Goal Achievement: 03/24/14 Potential to Achieve Goals: Fair  Visit Information  Last PT Received On: 03/10/14 Assistance Needed: +2 History of Present Illness: pt presents with hip fx s/p Arthroplasty.         Prior [place] expects to be discharged to:: Skilled nursing facility Prior Function Level of Independence: Needs assistance Gait / Transfers Assistance Needed: Per family only amb short distances, but uses W/C mostly.   ADL's / Homemaking Assistance Needed: A with all ADLs Communication Communication: No difficulties    Cognition  Cognition Arousal/Alertness: Lethargic Behavior During Therapy: Restless Overall Cognitive Status: History of cognitive impairments - at baseline    Extremity/Trunk Assessment Upper Extremity Assessment Upper Extremity Assessment: Defer to OT evaluation Lower Extremity Assessment Lower Extremity Assessment: Generalized weakness;Difficult to assess due to impaired cognition;LLE deficits/detail LLE Deficits / Details: pt very painful and not observed actively moving L LE.     Balance    End of Session PT - End of Session Equipment Utilized During Treatment: Oxygen Activity Tolerance: Patient limited by pain (Limited by cognition) Patient left: in bed;with call bell/phone within reach;with bed alarm set Nurse Communication: Mobility status  GP     Catarina Hartshorn, [place] 03/10/2014, 1:04 PM

## 2014-03-10 NOTE — Progress Notes (Addendum)
Clinical Social Work Department CLINICAL SOCIAL WORK PLACEMENT NOTE 03/10/2014  Patient:  Michelle Yoder, Michelle Yoder  Account Number:  [id number] Admit date:  03/07/2014  Clinical Social Worker:  Berton Mount, Latanya Presser  Date/time:  03/10/2014 01:00 PM  Clinical Social Work is seeking post-discharge placement for this patient at the following level of care:   [place]   (*CSW will update this form in Epic as items are completed)   03/10/2014  Patient/family provided with [place] Department of Clinical Social Work's list of facilities offering this level of care within the geographic area requested by the patient (or if unable, by the patient's family).  03/10/2014  Patient/family informed of their freedom to choose among providers that offer the needed level of care, that participate in Medicare, Medicaid or managed care program needed by the patient, have an available bed and are willing to accept the patient.  03/10/2014  Patient/family informed of [facility]' ownership interest in [facility], as well as of the fact that they are under no obligation to receive care at this facility.  PASARR submitted to EDS on Existing PASARR number received from EDS on   FL2 transmitted to all facilities in geographic area requested by pt/family on  03/10/2014 FL2 transmitted to all facilities within larger geographic area on   Patient informed that his/her managed care company has contracts with or will negotiate with  certain facilities, including the following:     Patient/family informed of bed offers received:  03/12/2014 Patient chooses bed at [facility] Physician recommends and patient chooses bed at    Patient to be transferred to [facility] on  03/14/2014 Patient to be transferred to facility by [facility]  The following physician request were entered in Epic:   Additional Comments:   [place], [place]

## 2014-03-10 NOTE — Progress Notes (Signed)
Urinalysis and urine culture ordered for pt, but pt is incontinent.  Answering service contacted at 23:20.  [place], [place] returned call at 23:25 ; ordered one time in and out catherization to obtain urine specimen.

## 2014-03-10 NOTE — Progress Notes (Signed)
Patient ID: Michelle Yoder, female   DOB: 1923/04/03, 80 y.o.   MRN: [id number]     Subjective:  Patient reports pain as mild to moderate.  Patient confused and unaware of time and place. Patient also very hard of hearing.  Objective:   VITALS:   Filed Vitals:   03/09/14 2156 03/10/14 0112 03/10/14 0403 03/10/14 0543  BP: 117/59 116/49  136/58  Pulse: 114 113 97 106  Temp: 98.1 F (36.7 C) 99 F (37.2 C)  97.9 F (36.6 C)  TempSrc: Oral Oral  Oral  Resp: 18 20  20   Height:      Weight:      SpO2: 96% 95%  97%    ABD soft Sensation intact distally Dorsiflexion/Plantar flexion intact Incision: dressing C/D/I and no drainage   Lab Results  Component Value Date   WBC 8.5 03/10/2014   HGB 9.3* 03/10/2014   HCT 29.0* 03/10/2014   MCV 93.9 03/10/2014   PLT 285 03/10/2014     Assessment/Plan: 1 Day Post-Op   Principal Problem:   Hip fracture, left Active Problems:   Diabetes mellitus without complication   Dementia   Hypertension   Femur fracture, left   RA (rheumatoid arthritis)   Chronic use of steroids   Advance diet Up with therapy WBAT Continue plan per medicine ABLA continue to monitor   Remonia Richter 03/10/2014, 7:31 AM  Discussed and agree with above. Marchia Bond Cosigning for: Edmonia Lynch MD [phone]

## 2014-03-11 LAB — URINALYSIS, ROUTINE W REFLEX MICROSCOPIC
BILIRUBIN URINE: NEGATIVE
Glucose, UA: NEGATIVE mg/dL
HGB URINE DIPSTICK: NEGATIVE
Ketones, ur: 15 mg/dL — AB
Leukocytes, UA: NEGATIVE
NITRITE: NEGATIVE
PH: 5.5 (ref 5.0–8.0)
Protein, ur: NEGATIVE mg/dL
Specific Gravity, Urine: 1.02 (ref 1.005–1.030)
Urobilinogen, UA: 1 mg/dL (ref 0.0–1.0)

## 2014-03-11 LAB — GLUCOSE, CAPILLARY
GLUCOSE-CAPILLARY: 173 mg/dL — AB (ref 70–99)
Glucose-Capillary: 92 mg/dL (ref 70–99)
Glucose-Capillary: 132 mg/dL — ABNORMAL HIGH (ref 70–99)
Glucose-Capillary: 112 mg/dL — ABNORMAL HIGH (ref 70–99)

## 2014-03-11 LAB — BASIC METABOLIC PANEL
BUN: 32 mg/dL — ABNORMAL HIGH (ref 6–23)
CHLORIDE: 101 meq/L (ref 96–112)
CO2: 22 mEq/L (ref 19–32)
CREATININE: 0.86 mg/dL (ref 0.50–1.10)
Calcium: 8.7 mg/dL (ref 8.4–10.5)
GFR calc non Af Amer: 59 mL/min — ABNORMAL LOW (ref >90)
GFR, EST AFRICAN AMERICAN: 68 mL/min — AB (ref >90)
Glucose, Bld: 127 mg/dL — ABNORMAL HIGH (ref 70–99)
Potassium: 4.6 mEq/L (ref 3.7–5.3)
Sodium: 136 mEq/L — ABNORMAL LOW (ref 137–147)

## 2014-03-11 MED ORDER — LORAZEPAM 0.5 MG PO TABS
0.5000 mg | ORAL_TABLET | Freq: Two times a day (BID) | ORAL | Status: DC | PRN
  Administered 2014-03-12 – 2014-03-15 (×3): 0.5 mg via ORAL
  Filled 2014-03-11 (×3): qty 1

## 2014-03-11 MED ORDER — HYDROMORPHONE HCL PF 1 MG/ML IJ SOLN: 0.5000 mg | INTRAMUSCULAR | Status: DC | PRN

## 2014-03-11 MED ORDER — SODIUM CHLORIDE 0.9 % IV SOLN: INTRAVENOUS | Status: DC

## 2014-03-11 NOTE — Progress Notes (Signed)
     Subjective:  Patient is somewhat confused, does not appear to be in pain, is difficult to communicate with, and I could not really get her to follow commands.  Objective:   VITALS:   Filed Vitals:   03/10/14 2121 03/11/14 0000 03/11/14 0400 03/11/14 0610  BP: 142/62   145/64  Pulse: 104   102  Temp: 98.9 F (37.2 C)   98.6 F (37 C)  TempSrc:      Resp: 20 18 16 18   Height:      Weight:      SpO2: 99% 96% 96% 99%   Her dressing appears clean, she has some redness and swelling around the incision site which is within expected limits given the surgical intervention. There is no drainage or evidence of infection.  Lab Results  Component Value Date   WBC 8.5 03/10/2014   HGB 9.3* 03/10/2014   HCT 29.0* 03/10/2014   MCV 93.9 03/10/2014   PLT 285 03/10/2014     Assessment/Plan: 2 Days Post-Op   Principal Problem:   Hip fracture, left Active Problems:   Diabetes mellitus without complication   Dementia   Hypertension   Femur fracture, left   RA (rheumatoid arthritis)   Chronic use of steroids  mild acute blood loss anemia, observed  Disposition per primary team.  Up with therapy Discharge to SNF    Markanthony Gedney P 03/11/2014, 10:22 AM   Marchia Bond, MD Cell [phone]

## 2014-03-11 NOTE — Progress Notes (Signed)
OT Cancellation Note  Patient Details Name: Michelle Yoder MRN: [id number] DOB: [birth year]   Cancelled Treatment:    Reason Eval/Treat Not Completed: OT screened, no needs identified, will sign off.  Order received, reviewed chart. Pt planning to d/c to SNF for rehab. All further OT needs can be addressed at next venue of care.     Benito Mccreedy OTR/L [phone] 03/11/2014, 1:50 PM

## 2014-03-11 NOTE — Progress Notes (Signed)
Will continue to encourage oral intake due to no IV access. Patient will drink glucerna shakes with encouragement.

## 2014-03-11 NOTE — Progress Notes (Signed)
Patient ID: Michelle Yoder  female  WFU:[id number]    DOB: [birth year]    DOA: 03/07/2014  PCP: No PCP Per Patient  Assessment/Plan: Principal Problem:   Hip fracture, left , fall 3 weeks ago complicated with postoperative confusion -Status post bipolar left hip arthroplasty, postop day 2 - Continue pain control  Acute encephalopathy on dementia: Likely worsened due to medications, Robaxin, narcotics, worsening dementia and has hospital/ sundowning - I have discontinued Robaxin, minimize narcotics as tolerated - UA shows ketones, hence continue IV fluids -Discontinued Ativan  Problems:   Diabetes mellitus without complication - Placed on sliding scale insulin, continue carb modified diet, patient is not eating much. Will continue IV fluids until her appetite improves     Hypertension - Currently stable    RA (rheumatoid arthritis): Continue Arava and prednisone    DVT Prophylaxis: SCDs   Code Status: full code   Family Communication:  Disposition: will likely need skilled nursing facility at the time of dc  Consultants:  Orthopedics   Procedures:  None    Antibiotics:  Subjective: Patient alert and awake, confused, difficult to communicate due to her mental status. Pain appears to be controlled   Objective: Weight change:   Intake/Output Summary (Last 24 hours) at 03/11/14 1302 Last data filed at 03/11/14 0915  Gross per 24 hour  Intake   1495 ml  Output      0 ml  Net   1495 ml   Blood pressure 145/64, pulse 102, temperature 98.6 F (37 C), temperature source Oral, resp. rate 18, height 5\' 4"  (1.626 m), weight 60.5 kg (133 lb 6.1 oz), SpO2 99.00%.  Physical Exam: General: Alert and awake, confused , somewhat agitated CVS: S1-S2 clear, no murmur rubs or gallops Chest: clear to auscultation bilaterally, no wheezing, rales or rhonchi Abdomen: soft nontender, nondistended, normal bowel sounds  Extremities: no cyanosis, clubbing or edema noted  bilaterally   Lab Results: Basic Metabolic Panel:  Recent Labs Lab 03/08/14 0135 03/10/14 0405  NA 139 137  K 3.7 4.4  CL 99 99  CO2 27 23  GLUCOSE 115* 131*  BUN 38* 29*  CREATININE 1.37* 1.07  CALCIUM 9.0 8.6   Liver Function Tests:  Recent Labs Lab 03/08/14 0135  AST 37  ALT 30  ALKPHOS 229*  BILITOT 0.4  PROT 6.4  ALBUMIN 2.4*   No results found for this basename: LIPASE, AMYLASE,  in the last 168 hours No results found for this basename: AMMONIA,  in the last 168 hours CBC:  Recent Labs Lab 03/08/14 0135 03/10/14 0405  WBC 8.4 8.5  NEUTROABS 5.7  --   HGB 10.7* 9.3*  HCT 33.0* 29.0*  MCV 94.0 93.9  PLT PLATELET CLUMPS NOTED ON SMEAR, COUNT APPEARS ADEQUATE 285   Cardiac Enzymes: No results found for this basename: CKTOTAL, CKMB, CKMBINDEX, TROPONINI,  in the last 168 hours BNP: No components found with this basename: POCBNP,  CBG:  Recent Labs Lab 03/10/14 1152 03/10/14 1629 03/10/14 2148 03/11/14 0638 03/11/14 1102  GLUCAP 102* 108* 104* 92 173*     Micro Results: Recent Results (from the past 240 hour(s))  MRSA PCR SCREENING     Status: None   Collection Time    03/09/14  5:17 AM      Result Value Ref Range Status   MRSA by PCR NEGATIVE  NEGATIVE Final   Comment:            The GeneXpert MRSA Assay (FDA  approved for NASAL specimens     only), is one component of a     comprehensive MRSA colonization     surveillance program. It is not     intended to diagnose MRSA     infection nor to guide or     monitor treatment for     MRSA infections.    Studies/Results: Dg Lumbar Spine Complete  03/08/2014   CLINICAL DATA Low back pain, no known injury.  EXAM LUMBAR SPINE - COMPLETE 4+ VIEW  COMPARISON None.  FINDINGS Multilevel degenerative changes with osteophyte formation and disc height loss. Diffuse osteopenia. There is mild superior endplate height loss at L5. Maintained vertebral body alignment. Atherosclerotic vascular  calcifications. Lower lumbar/lumbosacral facet arthropathy.  IMPRESSION Multilevel degenerative changes. Mild superior endplate height loss at L5 is age indeterminate. Correlate for point tenderness.  SIGNATURE  Electronically Signed   By: Carlos Levering M.D.   On: 03/08/2014 01:34   Dg Hip Complete Left  03/08/2014   CLINICAL DATA Left hip pain.  EXAM LEFT HIP - COMPLETE 2+ VIEW  COMPARISON None.  FINDINGS Severe deformity of left femoral head is noted consistent with avascular necrosis. Subsequent degenerative joint disease of the left hip is noted. There is noted some lucency in the region of the left femoral neck which may represent fracture.  IMPRESSION Severe deformity of left femoral head is noted consistent with avascular necrosis and subsequent degenerative joint disease of the left hip. Curvilinear lucency is seen below the left femoral head which may suggest fracture ; CT scan of the hip is recommended for further evaluation.  SIGNATURE  Electronically Signed   By: Sabino Dick M.D.   On: 03/08/2014 01:30   Ct Hip Left Wo Contrast  03/08/2014   CLINICAL DATA Left hip pain and left upper leg pain for 2 weeks.  EXAM CT OF THE LEFT HIP WITHOUT CONTRAST  TECHNIQUE Multidetector CT imaging was performed according to the standard protocol. Multiplanar CT image reconstructions were also generated.  COMPARISON DG HIP COMPLETE*L* dated 03/08/2014  FINDINGS Comminuted acute to subacute appearing fracture of the femoral head with impaction and sclerotic femoral head fragments. The fracture extends from the articular surface to the femoral head neck junction. No dislocation.  Subcentimeter apparent loose bodies extending into the iliopsoas bursa.  Bone mineral density is decreased with enthesopathy at greater trochanter. No destructive bony lesions. Limited view of the pelvis demonstrates sigmoid diverticulosis and phleboliths.  IMPRESSION Comminuted femoral subcapital femur fracture extending through the head  to the neck junction with impaction. Findings may reflect subacute insufficiency fracture. No dislocation.  Please note, mildly sclerotic fracture fragments may reflect impaction, though are nonspecific. This is highly unlikely to reflect pathologic fracture.  SIGNATURE  Electronically Signed   By: Elon Alas   On: 03/08/2014 02:50    Medications: Scheduled Meds: . aspirin EC  325 mg Oral Q breakfast  . cholecalciferol  1,000 Units Oral Daily  . influenza vac split quadrivalent PF  0.5 mL Intramuscular Tomorrow-1000  . insulin aspart  0-5 Units Subcutaneous QHS  . insulin aspart  0-9 Units Subcutaneous TID WC  . leflunomide  20 mg Oral Daily  . pantoprazole  40 mg Oral Daily  . predniSONE  2.5 mg Oral TID  . simethicone  80 mg Oral BID      LOS: 4 days   Zamya Culhane M.D. [facility] 03/11/2014, 1:02 PM Pager: [phone]  If 7PM-7AM, please contact night-coverage [email or web address] Password [email or web address]

## 2014-03-12 LAB — CBC
HCT: 25.1 % — ABNORMAL LOW (ref 36.0–46.0)
HEMATOCRIT: 24 % — AB (ref 36.0–46.0)
Hemoglobin: 8.2 g/dL — ABNORMAL LOW (ref 12.0–15.0)
Hemoglobin: 7.8 g/dL — ABNORMAL LOW (ref 12.0–15.0)
MCH: 30.1 pg (ref 26.0–34.0)
MCH: 29.8 pg (ref 26.0–34.0)
MCHC: 32.7 g/dL (ref 30.0–36.0)
MCHC: 32.5 g/dL (ref 30.0–36.0)
MCV: 92.3 fL (ref 78.0–100.0)
MCV: 91.6 fL (ref 78.0–100.0)
PLATELETS: 283 10*3/uL (ref 150–400)
Platelets: 258 10*3/uL (ref 150–400)
RBC: 2.72 MIL/uL — ABNORMAL LOW (ref 3.87–5.11)
RBC: 2.62 MIL/uL — ABNORMAL LOW (ref 3.87–5.11)
RDW: 14.8 % (ref 11.5–15.5)
RDW: 14.5 % (ref 11.5–15.5)
WBC: 9.7 10*3/uL (ref 4.0–10.5)
WBC: 8.1 10*3/uL (ref 4.0–10.5)

## 2014-03-12 LAB — URINE CULTURE
CULTURE: NO GROWTH
Colony Count: NO GROWTH

## 2014-03-12 LAB — PREPARE RBC (CROSSMATCH)

## 2014-03-12 LAB — GLUCOSE, CAPILLARY
Glucose-Capillary: 105 mg/dL — ABNORMAL HIGH (ref 70–99)
Glucose-Capillary: 85 mg/dL (ref 70–99)
Glucose-Capillary: 128 mg/dL — ABNORMAL HIGH (ref 70–99)
Glucose-Capillary: 110 mg/dL — ABNORMAL HIGH (ref 70–99)

## 2014-03-12 NOTE — Progress Notes (Addendum)
Patient ID: Michelle Yoder  female  YPP:[id number]    DOB: September 16, 1923    DOA: 03/07/2014  PCP: No PCP Per Patient  Assessment/Plan: Principal Problem:   Hip fracture, left , fall 3 weeks ago complicated with postoperative confusion -Status post bipolar left hip arthroplasty, postop day3 - Continue pain control  Acute encephalopathy on dementia: Likely worsened due to medications, Robaxin, narcotics, worsening dementia and has hospital/ sundowning, slowly improving. Patient was able to respond yes and no today, appears comfortable denied any pain - I have discontinued Robaxin, minimize narcotics as tolerated - UA shows ketones, hence continue IV fluids - Discontinued Ativan  Problems:   Diabetes mellitus without complication - Placed on sliding scale insulin, continue carb modified diet, patient is not eating much. Will continue IV fluids until her appetite improves     Hypertension - Currently stable    RA (rheumatoid arthritis): Continue Arava and prednisone  Anemia: Likely postop - Hemoglobin down to 7.8 today, will transfuse 2 units packed RBCs    DVT Prophylaxis: SCDs   Code Status: full code   Family Communication:  Disposition: will likely need skilled nursing facility at the time of dc  Consultants:  Orthopedics   Procedures:  None    Antibiotics:  Subjective: Somewhat  interactive today, no acute issues overnight   Objective: Weight change:   Intake/Output Summary (Last 24 hours) at 03/12/14 1707 Last data filed at 03/12/14 1642  Gross per 24 hour  Intake    600 ml  Output      0 ml  Net    600 ml   Blood pressure 136/63, pulse 100, temperature 98 F (36.7 C), temperature source Oral, resp. rate 18, height 5\' 4"  (1.626 m), weight 60.5 kg (133 lb 6.1 oz), SpO2 96.00%.  Physical Exam: General: Seen in the morning, kept her eyes closed but answers yes and no qns does not appear to be in pain CVS: S1-S2 clear, no murmur rubs or gallops Chest:  clear to auscultation bilaterally, no wheezing, rales or rhonchi Abdomen: soft nontender, nondistended, normal bowel sounds  Extremities: no cyanosis, clubbing or edema noted bilaterally   Lab Results: Basic Metabolic Panel:  Recent Labs Lab 03/10/14 0405 03/11/14 2028  NA 137 136*  K 4.4 4.6  CL 99 101  CO2 23 22  GLUCOSE 131* 127*  BUN 29* 32*  CREATININE 1.07 0.86  CALCIUM 8.6 8.7   Liver Function Tests:  Recent Labs Lab 03/08/14 0135  AST 37  ALT 30  ALKPHOS 229*  BILITOT 0.4  PROT 6.4  ALBUMIN 2.4*   No results found for this basename: LIPASE, AMYLASE,  in the last 168 hours No results found for this basename: AMMONIA,  in the last 168 hours CBC:  Recent Labs Lab 03/08/14 0135  03/11/14 2310 03/12/14 0527  WBC 8.4  < > 9.7 8.1  NEUTROABS 5.7  --   --   --   HGB 10.7*  < > 8.2* 7.8*  HCT 33.0*  < > 25.1* 24.0*  MCV 94.0  < > 92.3 91.6  PLT PLATELET CLUMPS NOTED ON SMEAR, COUNT APPEARS ADEQUATE  < > 283 258  < > = values in this interval not displayed. Cardiac Enzymes: No results found for this basename: CKTOTAL, CKMB, CKMBINDEX, TROPONINI,  in the last 168 hours BNP: No components found with this basename: POCBNP,  CBG:  Recent Labs Lab 03/11/14 1612 03/11/14 2147 03/12/14 0627 03/12/14 1124 03/12/14 1624  GLUCAP 132* 112* 105*  78 128*     Micro Results: Recent Results (from the past 240 hour(s))  MRSA PCR SCREENING     Status: None   Collection Time    03/09/14  5:17 AM      Result Value Ref Range Status   MRSA by PCR NEGATIVE  NEGATIVE Final   Comment:            The GeneXpert MRSA Assay (FDA     approved for NASAL specimens     only), is one component of a     comprehensive MRSA colonization     surveillance program. It is not     intended to diagnose MRSA     infection nor to guide or     monitor treatment for     MRSA infections.  URINE CULTURE     Status: None   Collection Time    03/10/14 11:54 PM      Result Value Ref  Range Status   Specimen Description URINE, CATHETERIZED   Final   Special Requests NONE   Final   Culture  Setup Time     Final   Value: 03/11/2014 04:16     Performed at [place]     Final   Value: NO GROWTH     Performed at [facility]   Culture     Final   Value: NO GROWTH     Performed at [facility]   Report Status 03/12/2014 FINAL   Final    Studies/Results: Dg Lumbar Spine Complete  03/08/2014   CLINICAL DATA Low back pain, no known injury.  EXAM LUMBAR SPINE - COMPLETE 4+ VIEW  COMPARISON None.  FINDINGS Multilevel degenerative changes with osteophyte formation and disc height loss. Diffuse osteopenia. There is mild superior endplate height loss at L5. Maintained vertebral body alignment. Atherosclerotic vascular calcifications. Lower lumbar/lumbosacral facet arthropathy.  IMPRESSION Multilevel degenerative changes. Mild superior endplate height loss at L5 is age indeterminate. Correlate for point tenderness.  SIGNATURE  Electronically Signed   By: Carlos Levering M.D.   On: 03/08/2014 01:34   Dg Hip Complete Left  03/08/2014   CLINICAL DATA Left hip pain.  EXAM LEFT HIP - COMPLETE 2+ VIEW  COMPARISON None.  FINDINGS Severe deformity of left femoral head is noted consistent with avascular necrosis. Subsequent degenerative joint disease of the left hip is noted. There is noted some lucency in the region of the left femoral neck which may represent fracture.  IMPRESSION Severe deformity of left femoral head is noted consistent with avascular necrosis and subsequent degenerative joint disease of the left hip. Curvilinear lucency is seen below the left femoral head which may suggest fracture ; CT scan of the hip is recommended for further evaluation.  SIGNATURE  Electronically Signed   By: Sabino Dick M.D.   On: 03/08/2014 01:30   Ct Hip Left Wo Contrast  03/08/2014   CLINICAL DATA Left hip pain and left upper leg pain for 2 weeks.  EXAM CT OF  THE LEFT HIP WITHOUT CONTRAST  TECHNIQUE Multidetector CT imaging was performed according to the standard protocol. Multiplanar CT image reconstructions were also generated.  COMPARISON DG HIP COMPLETE*L* dated 03/08/2014  FINDINGS Comminuted acute to subacute appearing fracture of the femoral head with impaction and sclerotic femoral head fragments. The fracture extends from the articular surface to the femoral head neck junction. No dislocation.  Subcentimeter apparent loose bodies extending into the iliopsoas bursa.  Bone mineral density is decreased with enthesopathy at greater trochanter. No destructive bony lesions. Limited view of the pelvis demonstrates sigmoid diverticulosis and phleboliths.  IMPRESSION Comminuted femoral subcapital femur fracture extending through the head to the neck junction with impaction. Findings may reflect subacute insufficiency fracture. No dislocation.  Please note, mildly sclerotic fracture fragments may reflect impaction, though are nonspecific. This is highly unlikely to reflect pathologic fracture.  SIGNATURE  Electronically Signed   By: Elon Alas   On: 03/08/2014 02:50    Medications: Scheduled Meds: . aspirin EC  325 mg Oral Q breakfast  . cholecalciferol  1,000 Units Oral Daily  . influenza vac split quadrivalent PF  0.5 mL Intramuscular Tomorrow-1000  . insulin aspart  0-5 Units Subcutaneous QHS  . insulin aspart  0-9 Units Subcutaneous TID WC  . leflunomide  20 mg Oral Daily  . pantoprazole  40 mg Oral Daily  . predniSONE  2.5 mg Oral TID  . simethicone  80 mg Oral BID      LOS: 5 days   RAI,RIPUDEEP M.D. [facility] 03/12/2014, 5:07 PM Pager: [phone]  If 7PM-7AM, please contact night-coverage [email or web address] Password [email or web address]

## 2014-03-12 NOTE — Progress Notes (Signed)
     Subjective:  Patient does not report pain.  More interactive today, but still minimal.  Doesn't open eyes, but does respond verbally.  Objective:   VITALS:   Filed Vitals:   03/11/14 0400 03/11/14 0610 03/11/14 2130 03/12/14 0500  BP:  145/64 129/56 141/62  Pulse:  102 107 85  Temp:  98.6 F (37 C) 99.1 F (37.3 C) 97.8 F (36.6 C)  TempSrc:    Oral  Resp: 16 18 18 18   Height:      Weight:      SpO2: 96% 99% 98% 96%    I finally got her to dorsiflex operative ankle.  Dressings clean, no drainage.  Lab Results  Component Value Date   WBC 8.1 03/12/2014   HGB 7.8* 03/12/2014   HCT 24.0* 03/12/2014   MCV 91.6 03/12/2014   PLT 258 03/12/2014     Assessment/Plan: 3 Days Post-Op   Principal Problem:   Hip fracture, left Active Problems:   Diabetes mellitus without complication   Dementia   Hypertension   Femur fracture, left   RA (rheumatoid arthritis)   Chronic use of steroids   ABLA, moderate with some tachycardia.  Would defer to primary team if blood transfusion is indicated.  I would not expect further drop, as she is POD3 at this point.    SNF planning likely.  WBAT.   Kayhan Boardley P 03/12/2014, 9:28 AM   Marchia Bond, MD Cell [phone]

## 2014-03-12 NOTE — Progress Notes (Addendum)
Clinical Education officer, museum (CSW) spoke with patient's son in law Dan who reported that they are still thinking about which facility they want the patient to go to and are leaning towards [facility]. CSW left bed offers in patient's room for daughter [name] and her husband Linna Hoff to review.  Blima Rich, [place] Weekend CSW [phone]   Patient's daughter [name] toured [place] today reported to CSW that [name] they want the patient to transfer to when she is ready for D/C. Weekday CSW will facilitate D/C to [facility].   Blima Rich, [place] Weekend CSW [phone]

## 2014-03-13 ENCOUNTER — Inpatient Hospital Stay (HOSPITAL_COMMUNITY): Payer: Medicare Other

## 2014-03-13 LAB — GLUCOSE, CAPILLARY
GLUCOSE-CAPILLARY: 99 mg/dL (ref 70–99)
GLUCOSE-CAPILLARY: 106 mg/dL — AB (ref 70–99)
Glucose-Capillary: 105 mg/dL — ABNORMAL HIGH (ref 70–99)
Glucose-Capillary: 90 mg/dL (ref 70–99)

## 2014-03-13 MED ORDER — METOPROLOL TARTRATE 12.5 MG HALF TABLET: 12.5000 mg | ORAL_TABLET | Freq: Two times a day (BID) | ORAL | Status: DC

## 2014-03-13 MED ORDER — LORAZEPAM 0.5 MG PO TABS: 0.5000 mg | ORAL_TABLET | Freq: Two times a day (BID) | ORAL | Status: DC | PRN

## 2014-03-13 MED ORDER — HYDROCODONE-ACETAMINOPHEN 5-325 MG PO TABS: 1.0000 | ORAL_TABLET | Freq: Four times a day (QID) | ORAL | Status: DC | PRN

## 2014-03-13 MED ORDER — ASPIRIN EC 325 MG PO TBEC: 325.0000 mg | DELAYED_RELEASE_TABLET | Freq: Every day | ORAL | Status: DC

## 2014-03-13 MED ORDER — METOPROLOL TARTRATE 12.5 MG HALF TABLET
12.5000 mg | ORAL_TABLET | Freq: Two times a day (BID) | ORAL | Status: DC
  Administered 2014-03-13 – 2014-03-15 (×5): 12.5 mg via ORAL
  Filled 2014-03-13 (×6): qty 1

## 2014-03-13 NOTE — Progress Notes (Signed)
CSW (Clinical Education officer, museum) spoke with MD and was updated on anticipated dc for tomorrow. CSW called [facility] admissions and left voicemail to notify.  [place], [place]

## 2014-03-13 NOTE — Progress Notes (Signed)
Physical Therapy Treatment Patient Details Name: Michelle Yoder MRN: [id number] DOB: 08/16/1923 Today's Date: 03/13/2014 Time: 2774-1287 PT Time Calculation (min): 18 min  PT Assessment / Plan / Recommendation  History of Present Illness pt presents with hip fx s/p Arthroplasty.     PT Comments   Pt making slow progress towards physical therapy goals. Transferred bed>recliner with +2 max assist. Skilled nursing facility continues to be an appropriate d/c disposition for this patient.   Follow Up Recommendations  SNF     Does the patient have the potential to tolerate intense rehabilitation     Barriers to Discharge        Equipment Recommendations  None recommended by PT    Recommendations for Other Services    Frequency Min 3X/week   Progress towards PT Goals Progress towards PT goals: Progressing toward goals  Plan Current plan remains appropriate    Precautions / Restrictions Precautions Precautions: Fall Restrictions Weight Bearing Restrictions: Yes LLE Weight Bearing: Weight bearing as tolerated   Pertinent Vitals/Pain Pt does not rate pain on 0-10 scale however cries out during transfers. RN aware with meds planned after session.   Mobility  Bed Mobility Overal bed mobility: Needs Assistance Bed Mobility: Supine to Sit Supine to sit: Max assist;+2 for physical assistance General bed mobility comments: Bed pad use for scooting, and max assist for movement of LE's and trunk support.  Transfers Overall transfer level: Needs assistance Equipment used: 2 person hand held assist Transfers: Stand Pivot Transfers;Sit to/from Stand Sit to Stand: +2 physical assistance;Max assist Stand pivot transfers: Total assist;+2 physical assistance General transfer comment: Assist to initiate standing, and for general support as pt was pivoted around to the chair. Pt unable to take any steps or advance the operative leg suring SPT.     Exercises     PT Diagnosis:    PT Problem  List:   PT Treatment Interventions:     PT Goals (current goals can now be found in the care plan section) Acute Rehab PT Goals Patient Stated Goal: None stated PT Goal Formulation: Patient unable to participate in goal setting Time For Goal Achievement: 03/24/14 Potential to Achieve Goals: Fair  Visit Information  Last PT Received On: 03/13/14 Assistance Needed: +2 History of Present Illness: pt presents with hip fx s/p Arthroplasty.      Subjective Data  Subjective: Pt keeps eyes closed for most of session, speaking only complain of pain.  Patient Stated Goal: None stated   Cognition  Cognition Arousal/Alertness: Lethargic Behavior During Therapy: Restless Overall Cognitive Status: History of cognitive impairments - at baseline    Balance  Balance Overall balance assessment: History of Falls;Needs assistance Sitting-balance support: Feet supported;Bilateral upper extremity supported Sitting balance-Leahy Scale: Poor Postural control: Left lateral lean Standing balance support: Bilateral upper extremity supported Standing balance-Leahy Scale: Poor General Comments General comments (skin integrity, edema, etc.): Pt unable/unwilling to follow commands for therapeutic exercise. General ther ex attempted with no return.   End of Session PT - End of Session Equipment Utilized During Treatment: Gait belt Activity Tolerance: Other (comment);Patient limited by pain (Limited by cognition) Patient left: in chair;with call bell/phone within reach;with chair alarm set Nurse Communication: Mobility status   GP     Jolyn Lent 03/13/2014, 10:52 AM  Jolyn Lent, PT, DPT Acute Rehabilitation Services Pager: [phone]

## 2014-03-13 NOTE — Progress Notes (Signed)
Patient ID: Michelle Yoder  female  PIR:[id number]    DOB: 09-22-1923    DOA: 03/07/2014  PCP: No PCP Per Patient  Assessment/Plan: Principal Problem:   Hip fracture, left , fall 3 weeks ago complicated with postoperative confusion -Status post bipolar left hip arthroplasty, postop day4 - Continue pain control - status post 2 units packed RBCs, unable to obtain labs for repeat CBC   Acute encephalopathy on dementia: Likely worsened due to medications, Robaxin, narcotics, worsening dementia and has hospital/ sundowning, slowly improving. Still lethargic, making very slow progress towards physical therapy, will need skilled nursing facility  -  discontinued Robaxin, minimize narcotics as tolerated  Problems:   Diabetes mellitus without complication - Placed on sliding scale insulin, continue carb modified diet, patient is not eating much. Will continue IV fluids until her appetite improves     Hypertension - Currently stable    RA (rheumatoid arthritis): Continue Arava and prednisone  Anemia: Likely postop - Hemoglobin down to 7.8 03/12/2014, transfuse 2 units packed RBC overnight, unable to obtain CBC today    DVT Prophylaxis: SCDs   Code Status: full code   Family Communication: called patient's family unable to make contact  Disposition: SNF IN AM  Consultants:  Orthopedics   Procedures:  None    Antibiotics:  Subjective:  patient seen earlier this morning, lethargic, confused could not open her eyes, says yes or no    Objective: Weight change:   Intake/Output Summary (Last 24 hours) at 03/13/14 1707 Last data filed at 03/13/14 1300  Gross per 24 hour  Intake    910 ml  Output      0 ml  Net    910 ml   Blood pressure 130/55, pulse 84, temperature 100 F (37.8 C), temperature source Oral, resp. rate 18, height 5\' 4"  (1.626 m), weight 60.5 kg (133 lb 6.1 oz), SpO2 95.00%.  Physical Exam: General: Seen in the morning, kept her eyes closed but answers  yes and no qns does not appear to be in pain CVS: S1-S2 clear, no murmur rubs or gallops Chest: clear to auscultation bilaterally, no wheezing, rales or rhonchi Abdomen: soft nontender, nondistended, normal bowel sounds  Extremities: no cyanosis, clubbing or edema noted bilaterally   Lab Results: Basic Metabolic Panel:  Recent Labs Lab 03/10/14 0405 03/11/14 2028  NA 137 136*  K 4.4 4.6  CL 99 101  CO2 23 22  GLUCOSE 131* 127*  BUN 29* 32*  CREATININE 1.07 0.86  CALCIUM 8.6 8.7   Liver Function Tests:  Recent Labs Lab 03/08/14 0135  AST 37  ALT 30  ALKPHOS 229*  BILITOT 0.4  PROT 6.4  ALBUMIN 2.4*   No results found for this basename: LIPASE, AMYLASE,  in the last 168 hours No results found for this basename: AMMONIA,  in the last 168 hours CBC:  Recent Labs Lab 03/08/14 0135  03/11/14 2310 03/12/14 0527  WBC 8.4  < > 9.7 8.1  NEUTROABS 5.7  --   --   --   HGB 10.7*  < > 8.2* 7.8*  HCT 33.0*  < > 25.1* 24.0*  MCV 94.0  < > 92.3 91.6  PLT PLATELET CLUMPS NOTED ON SMEAR, COUNT APPEARS ADEQUATE  < > 283 258  < > = values in this interval not displayed. Cardiac Enzymes: No results found for this basename: CKTOTAL, CKMB, CKMBINDEX, TROPONINI,  in the last 168 hours BNP: No components found with this basename: POCBNP,  CBG:  Recent Labs Lab 03/12/14 1624 03/12/14 2212 03/13/14 0717 03/13/14 1158 03/13/14 1627  GLUCAP 128* 110* 99 90 106*     Micro Results: Recent Results (from the past 240 hour(s))  MRSA PCR SCREENING     Status: None   Collection Time    03/09/14  5:17 AM      Result Value Ref Range Status   MRSA by PCR NEGATIVE  NEGATIVE Final   Comment:            The GeneXpert MRSA Assay (FDA     approved for NASAL specimens     only), is one component of a     comprehensive MRSA colonization     surveillance program. It is not     intended to diagnose MRSA     infection nor to guide or     monitor treatment for     MRSA infections.    URINE CULTURE     Status: None   Collection Time    03/10/14 11:54 PM      Result Value Ref Range Status   Specimen Description URINE, CATHETERIZED   Final   Special Requests NONE   Final   Culture  Setup Time     Final   Value: 03/11/2014 04:16     Performed at [place]     Final   Value: NO GROWTH     Performed at [facility]   Culture     Final   Value: NO GROWTH     Performed at [facility]   Report Status 03/12/2014 FINAL   Final    Studies/Results: Dg Lumbar Spine Complete  03/08/2014   CLINICAL DATA Low back pain, no known injury.  EXAM LUMBAR SPINE - COMPLETE 4+ VIEW  COMPARISON None.  FINDINGS Multilevel degenerative changes with osteophyte formation and disc height loss. Diffuse osteopenia. There is mild superior endplate height loss at L5. Maintained vertebral body alignment. Atherosclerotic vascular calcifications. Lower lumbar/lumbosacral facet arthropathy.  IMPRESSION Multilevel degenerative changes. Mild superior endplate height loss at L5 is age indeterminate. Correlate for point tenderness.  SIGNATURE  Electronically Signed   By: Carlos Levering M.D.   On: 03/08/2014 01:34   Dg Hip Complete Left  03/08/2014   CLINICAL DATA Left hip pain.  EXAM LEFT HIP - COMPLETE 2+ VIEW  COMPARISON None.  FINDINGS Severe deformity of left femoral head is noted consistent with avascular necrosis. Subsequent degenerative joint disease of the left hip is noted. There is noted some lucency in the region of the left femoral neck which may represent fracture.  IMPRESSION Severe deformity of left femoral head is noted consistent with avascular necrosis and subsequent degenerative joint disease of the left hip. Curvilinear lucency is seen below the left femoral head which may suggest fracture ; CT scan of the hip is recommended for further evaluation.  SIGNATURE  Electronically Signed   By: Sabino Dick M.D.   On: 03/08/2014 01:30   Ct Hip Left Wo  Contrast  03/08/2014   CLINICAL DATA Left hip pain and left upper leg pain for 2 weeks.  EXAM CT OF THE LEFT HIP WITHOUT CONTRAST  TECHNIQUE Multidetector CT imaging was performed according to the standard protocol. Multiplanar CT image reconstructions were also generated.  COMPARISON DG HIP COMPLETE*L* dated 03/08/2014  FINDINGS Comminuted acute to subacute appearing fracture of the femoral head with impaction and sclerotic femoral head fragments. The fracture extends from the articular  surface to the femoral head neck junction. No dislocation.  Subcentimeter apparent loose bodies extending into the iliopsoas bursa.  Bone mineral density is decreased with enthesopathy at greater trochanter. No destructive bony lesions. Limited view of the pelvis demonstrates sigmoid diverticulosis and phleboliths.  IMPRESSION Comminuted femoral subcapital femur fracture extending through the head to the neck junction with impaction. Findings may reflect subacute insufficiency fracture. No dislocation.  Please note, mildly sclerotic fracture fragments may reflect impaction, though are nonspecific. This is highly unlikely to reflect pathologic fracture.  SIGNATURE  Electronically Signed   By: Elon Alas   On: 03/08/2014 02:50    Medications: Scheduled Meds: . aspirin EC  325 mg Oral Q breakfast  . cholecalciferol  1,000 Units Oral Daily  . influenza vac split quadrivalent PF  0.5 mL Intramuscular Tomorrow-1000  . insulin aspart  0-5 Units Subcutaneous QHS  . insulin aspart  0-9 Units Subcutaneous TID WC  . leflunomide  20 mg Oral Daily  . metoprolol tartrate  12.5 mg Oral BID  . pantoprazole  40 mg Oral Daily  . predniSONE  2.5 mg Oral TID  . simethicone  80 mg Oral BID      LOS: 6 days   Shivonne Schwartzman M.D. [facility] 03/13/2014, 5:07 PM Pager: [phone]  If 7PM-7AM, please contact night-coverage [email or web address] Password [email or web address]

## 2014-03-13 NOTE — Care Management Note (Signed)
CARE MANAGEMENT NOTE 03/13/2014  Patient:  NAARA, KELTY   Account Number:  [id number]  Date Initiated:  03/08/2014  Documentation initiated by:  Olga Coaster  Subjective/Objective Assessment:   ADMITTED POST FALL/ HIP FRACTURE     Action/Plan:   CM FOLLOWING FOR DCP  Patient for shortterm rehab at SNF   Anticipated DC Date:  03/14/2014   Anticipated DC Plan:  [place] referral  Clinical Social Worker      DC Planning Services  CM consult      Choice offered to / List presented to:             Status of service:  Completed, signed off Medicare Important Message given?  NA - LOS <3 / Initial given by admissions (If response is "NO", the following Medicare IM given date fields will be blank) Date Medicare IM given:   Date Additional Medicare IM given:    Discharge Disposition:  [place]

## 2014-03-14 ENCOUNTER — Other Ambulatory Visit: Payer: Self-pay | Admitting: *Deleted

## 2014-03-14 ENCOUNTER — Inpatient Hospital Stay (HOSPITAL_COMMUNITY): Payer: Medicare Other

## 2014-03-14 ENCOUNTER — Encounter (HOSPITAL_COMMUNITY): Payer: Self-pay | Admitting: Orthopedic Surgery

## 2014-03-14 LAB — TYPE AND SCREEN
ABO/RH(D): O POS
ANTIBODY SCREEN: NEGATIVE
UNIT DIVISION: 0
Unit division: 0

## 2014-03-14 LAB — URINALYSIS, ROUTINE W REFLEX MICROSCOPIC
Bilirubin Urine: NEGATIVE
Glucose, UA: NEGATIVE mg/dL
Hgb urine dipstick: NEGATIVE
KETONES UR: NEGATIVE mg/dL
LEUKOCYTES UA: NEGATIVE
NITRITE: NEGATIVE
PROTEIN: NEGATIVE mg/dL
Specific Gravity, Urine: 1.013 (ref 1.005–1.030)
Urobilinogen, UA: 1 mg/dL (ref 0.0–1.0)
pH: 7 (ref 5.0–8.0)

## 2014-03-14 LAB — GLUCOSE, CAPILLARY
GLUCOSE-CAPILLARY: 143 mg/dL — AB (ref 70–99)
GLUCOSE-CAPILLARY: 127 mg/dL — AB (ref 70–99)
Glucose-Capillary: 92 mg/dL (ref 70–99)
Glucose-Capillary: 106 mg/dL — ABNORMAL HIGH (ref 70–99)

## 2014-03-14 MED ORDER — LORAZEPAM 0.5 MG PO TABS: ORAL_TABLET | ORAL | Status: DC

## 2014-03-14 MED ORDER — HYDROCODONE-ACETAMINOPHEN 5-325 MG PO TABS: ORAL_TABLET | ORAL | Status: DC

## 2014-03-14 MED ORDER — TRAMADOL HCL ER 100 MG PO TB24: ORAL_TABLET | ORAL | Status: DC

## 2014-03-14 NOTE — Telephone Encounter (Signed)
[facility] 

## 2014-03-14 NOTE — Progress Notes (Addendum)
CSW (Clinical Education officer, museum) prepared pt dc packet and placed with shadow chart. CSW arranged non-emergent ambulance transport. Pt, pt family, pt nurse, and facility informed. CSW signing off.   ADDENDUM: CSW cancelled transport and informed facility dc will not be today.  [place], [place]

## 2014-03-14 NOTE — Procedures (Signed)
ELECTROENCEPHALOGRAM REPORT   Patient: Michelle Yoder       Room #: 1O10 EEG No. ID: [id number] Age: 80 y.o.        Sex: female Referring Physician: Rai Report Date:  03/14/2014        Interpreting Physician: Alexis Goodell D  History: Michelle Yoder is an 80 y.o. female with altered mental status  Medications:  Scheduled: . aspirin EC  325 mg Oral Q breakfast  . cholecalciferol  1,000 Units Oral Daily  . influenza vac split quadrivalent PF  0.5 mL Intramuscular Tomorrow-1000  . insulin aspart  0-5 Units Subcutaneous QHS  . insulin aspart  0-9 Units Subcutaneous TID WC  . leflunomide  20 mg Oral Daily  . metoprolol tartrate  12.5 mg Oral BID  . pantoprazole  40 mg Oral Daily  . predniSONE  2.5 mg Oral TID  . simethicone  80 mg Oral BID    Conditions of Recording:  This is a 16 channel EEG carried out with the patient in the drowsy and asleep states.  Description:  The patient does not exhibit full wakefulness for waking background activity to be evaluated.   The patient drowses with a slow background rhythm noted that consists mostly of irregular, low voltage theta and delta activity.   The patient goes in to a light sleep with symmetrical sleep spindles, vertex central sharp transients and irregular slow activity. Hyperventilation and intermittent photic stimulation were not performed.  IMPRESSION: This is a normal drowsy and asleep electroencephalogram.  No epileptiform activity is noted.     Alexis Goodell, MD [facility] [phone] 03/14/2014, 7:59 PM

## 2014-03-14 NOTE — Discharge Summary (Signed)
Physician Discharge Summary  Patient ID: Michelle Yoder MRN: [id number] DOB/AGE: 1923/04/22 80 y.o.  Admit date: 03/07/2014 Discharge date: 03/14/2014  Primary Care Physician:  No PCP Per Patient  Discharge Diagnoses:    . Hip fracture, left . Hypertension . Diabetes mellitus without complication . Dementia . Femur fracture, left . RA (rheumatoid arthritis) Anemia likely due to acute blood loss anemia postoperatively transfused 2 units packed RBC  Consults:  Orthopedics, Dr. Percell Miller   Recommendations for Outpatient Follow-up:  Weight bearing as tolerated on the left  Please continue aspirin 325 mg daily for one month then resume aspirin 81 mg daily per orthopedics recommendations for DVT prophylaxis  Allergies:  No Known Allergies   Discharge Medications:   Medication List    STOP taking these medications       aspirin 81 MG chewable tablet  Replaced by:  aspirin EC 325 MG tablet     furosemide 20 MG tablet  Commonly known as:  LASIX     gabapentin 100 MG capsule  Commonly known as:  NEURONTIN      TAKE these medications       aspirin EC 325 MG tablet  Take 1 tablet (325 mg total) by mouth daily. For 1 month, then resume ASA 81mg  daily     docusate sodium 100 MG capsule  Commonly known as:  COLACE  Take 1 capsule (100 mg total) by mouth 2 (two) times daily.     HYDROcodone-acetaminophen 5-325 MG per tablet  Commonly known as:  NORCO  Take 1 tablet by mouth every 6 (six) hours as needed for moderate pain.     lansoprazole 30 MG capsule  Commonly known as:  PREVACID  Take 30 mg by mouth daily at 12 noon.     leflunomide 20 MG tablet  Commonly known as:  ARAVA  Take 20 mg by mouth daily.     LORazepam 0.5 MG tablet  Commonly known as:  ATIVAN  Take 1 tablet (0.5 mg total) by mouth every 12 (twelve) hours as needed for anxiety or sleep (agitation).     metoprolol tartrate 12.5 mg Tabs tablet  Commonly known as:  LOPRESSOR  Take 0.5 tablets (12.5 mg  total) by mouth 2 (two) times daily.     MI-ACID GAS RELIEF 80 MG chewable tablet  Generic drug:  simethicone  Chew 80 mg by mouth 2 (two) times daily.     OVER THE COUNTER MEDICATION  Take 1 tablet by mouth daily. Medication name: Therapy M9mg -434mcg tablet     predniSONE 2.5 MG tablet  Commonly known as:  DELTASONE  Take 2.5 mg by mouth 3 (three) times daily. Take 2 tablets in the morning and 1 tablet at bed time.     sitaGLIPtin 50 MG tablet  Commonly known as:  JANUVIA  Take 50 mg by mouth daily.     traMADol 100 MG 24 hr tablet  Commonly known as:  ULTRAM-ER  Take 100 mg by mouth daily.     VITAMIN B COMPLEX PO  Take 1 capsule by mouth daily.     Vitamin D-3 1000 UNITS Caps  Take 1 capsule by mouth daily.         Brief H and P: For complete details please refer to admission H and P, but in brief 80 yo female with h/o RA dep on steroids, dm, htn, mild dementia comes in with 3 weeks of left hip pain. She has chronic back pain, but her left hip worsened  after a fall 3 weeks ago. pts daughter actually went to pick her up at her assisted living in [place], and was driving her here to a local assisted living. During the transportation, the pt pain got much worse. And unfortunately their car broke down enroute, by this time pts hip pain was severe so daughter called 911 from the side of the road. She normally walks with cane [name] but has had much difficulty in doing this for over 2 weeks. She had been getting injections in her left hip from a radiologist for the pain, last one was 3weeks ago, unsure if this was before or after the fall.     Hospital Course:   Hip fracture, left , fall 3 weeks ago complicated with postoperative confusion: Orthopedics was consulted and patient underwent  bipolar left hip arthroplasty, currently postop day5.  Continue pain control. Patient received 2 units packed RBCs, unable to obtain labs for repeat CBC. Her postop period was complicated by  confusion and sundowning.   Acute encephalopathy on dementia: Likely worsened due to medications, Robaxin, narcotics, worsening dementia and has hospital/ sundowning, slowly improving. She is making very slow progress towards physical therapy, will need skilled nursing facility. Minimize narcotics as tolerated. She actually is better with her mobility and mental status when her family is around.     Diabetes mellitus without complication - Placed on sliding scale insulin inpatient, continue carb modified diet  Hypertension - Currently stable   RA (rheumatoid arthritis): Continue Arava and prednisone   Anemia: Likely postop  - Hemoglobin went down to 7.8 03/12/2014, transfused 2 units packed RBC but unable to obtain CBC. She did spike low grade temp on 3/16. CXR did not show any acute pneumonia , UA showed no UTI patient has been afebrile this morning upon discharge     Day of Discharge BP 144/60  Pulse 88  Temp(Src) 99 F (37.2 C) (Oral)  Resp 18  Ht 5\' 4"  (1.626 m)  Wt 60.5 kg (133 lb 6.1 oz)  BMI 22.88 kg/m2  SpO2 96%  Physical Exam:  General: Resting, comfortable, kept her eyes closed but answers yes and no qns does not appear to be in pain  CVS: S1-S2 clear, no murmur rubs or gallops  Chest: clear to auscultation bilaterally, no wheezing, rales or rhonchi  Abdomen: soft nontender, nondistended, normal bowel sounds  Extremities: no cyanosis, clubbing or edema noted bilaterally      The results of significant diagnostics from this hospitalization (including imaging, microbiology, ancillary and laboratory) are listed below for reference.    LAB RESULTS: Basic Metabolic Panel:  Recent Labs Lab 03/10/14 0405 03/11/14 2028  NA 137 136*  K 4.4 4.6  CL 99 101  CO2 23 22  GLUCOSE 131* 127*  BUN 29* 32*  CREATININE 1.07 0.86  CALCIUM 8.6 8.7   Liver Function Tests:  Recent Labs Lab 03/08/14 0135  AST 37  ALT 30  ALKPHOS 229*  BILITOT 0.4  PROT 6.4  ALBUMIN  2.4*   No results found for this basename: LIPASE, AMYLASE,  in the last 168 hours No results found for this basename: AMMONIA,  in the last 168 hours CBC:  Recent Labs Lab 03/08/14 0135  03/11/14 2310 03/12/14 0527  WBC 8.4  < > 9.7 8.1  NEUTROABS 5.7  --   --   --   HGB 10.7*  < > 8.2* 7.8*  HCT 33.0*  < > 25.1* 24.0*  MCV 94.0  < >  92.3 91.6  PLT PLATELET CLUMPS NOTED ON SMEAR, COUNT APPEARS ADEQUATE  < > 283 258  < > = values in this interval not displayed. Cardiac Enzymes: No results found for this basename: CKTOTAL, CKMB, CKMBINDEX, TROPONINI,  in the last 168 hours BNP: No components found with this basename: POCBNP,  CBG:  Recent Labs Lab 03/14/14 0705 03/14/14 1131  GLUCAP 143* 92    Significant Diagnostic Studies:  Dg Lumbar Spine Complete  03/08/2014   CLINICAL DATA Low back pain, no known injury.  EXAM LUMBAR SPINE - COMPLETE 4+ VIEW  COMPARISON None.  FINDINGS Multilevel degenerative changes with osteophyte formation and disc height loss. Diffuse osteopenia. There is mild superior endplate height loss at L5. Maintained vertebral body alignment. Atherosclerotic vascular calcifications. Lower lumbar/lumbosacral facet arthropathy.  IMPRESSION Multilevel degenerative changes. Mild superior endplate height loss at L5 is age indeterminate. Correlate for point tenderness.  SIGNATURE  Electronically Signed   By: Carlos Levering M.D.   On: 03/08/2014 01:34   Dg Hip Complete Left  03/08/2014   CLINICAL DATA Left hip pain.  EXAM LEFT HIP - COMPLETE 2+ VIEW  COMPARISON None.  FINDINGS Severe deformity of left femoral head is noted consistent with avascular necrosis. Subsequent degenerative joint disease of the left hip is noted. There is noted some lucency in the region of the left femoral neck which may represent fracture.  IMPRESSION Severe deformity of left femoral head is noted consistent with avascular necrosis and subsequent degenerative joint disease of the left hip.  Curvilinear lucency is seen below the left femoral head which may suggest fracture ; CT scan of the hip is recommended for further evaluation.  SIGNATURE  Electronically Signed   By: Sabino Dick M.D.   On: 03/08/2014 01:30   Ct Hip Left Wo Contrast  03/08/2014   CLINICAL DATA Left hip pain and left upper leg pain for 2 weeks.  EXAM CT OF THE LEFT HIP WITHOUT CONTRAST  TECHNIQUE Multidetector CT imaging was performed according to the standard protocol. Multiplanar CT image reconstructions were also generated.  COMPARISON DG HIP COMPLETE*L* dated 03/08/2014  FINDINGS Comminuted acute to subacute appearing fracture of the femoral head with impaction and sclerotic femoral head fragments. The fracture extends from the articular surface to the femoral head neck junction. No dislocation.  Subcentimeter apparent loose bodies extending into the iliopsoas bursa.  Bone mineral density is decreased with enthesopathy at greater trochanter. No destructive bony lesions. Limited view of the pelvis demonstrates sigmoid diverticulosis and phleboliths.  IMPRESSION Comminuted femoral subcapital femur fracture extending through the head to the neck junction with impaction. Findings may reflect subacute insufficiency fracture. No dislocation.  Please note, mildly sclerotic fracture fragments may reflect impaction, though are nonspecific. This is highly unlikely to reflect pathologic fracture.  SIGNATURE  Electronically Signed   By: Elon Alas   On: 03/08/2014 02:50       Disposition and Follow-up:     Discharge Orders   Future Appointments Provider Department Dept Phone   07/10/2014 9:30 AM Rowe Clack, MD [place] [phone]   Future Orders Complete By Expires   Diet Carb Modified  As directed    Increase activity slowly  As directed    Weight bearing as tolerated  As directed    Questions:     Laterality:     Extremity:         DISPOSITION: Skilled nursing  facility DIET: Heart healthy diet   DISCHARGE FOLLOW-UP  Follow-up Information   Follow up with MURPHY, TIMOTHY, D, MD In 2 weeks. (or as scheduled)    Specialty:  Orthopedic Surgery   Contact information:   [address] [id number] [phone]       Time spent on Discharge: 40 minutes  Signed:   Namish Krise M.D. [facility] 03/14/2014, 12:05 PM Pager: [phone]

## 2014-03-14 NOTE — Telephone Encounter (Signed)
[facility] 

## 2014-03-14 NOTE — Telephone Encounter (Signed)
[facility] 

## 2014-03-14 NOTE — Progress Notes (Signed)
Discussed in detail with the patient's daughter, [name] who feels that patient is much more confused than yesterday. She was unable to feed her. Usually patient is somewhat more conversive and alert with her family members around. Chest x-ray was negative for any pneumonia, UA was negative for any UTI. Patient is not spiking any fevers. She has been getting aspirin 325 mg daily, however will rule out any stroke. We'll also get an EEG. However if patient does not have any acute CVA and if her mental status improves tomorrow, will be able to DC her to skilled nursing facility in AM. Hold discharge for today.   Yacqub Baston M.D. [facility] 03/14/2014, 2:40 PM  Pager: [phone]

## 2014-03-14 NOTE — Progress Notes (Signed)
EEG Completed; Results Pending  

## 2014-03-15 ENCOUNTER — Non-Acute Institutional Stay (SKILLED_NURSING_FACILITY): Payer: Medicare Other | Admitting: Internal Medicine

## 2014-03-15 ENCOUNTER — Encounter: Payer: Self-pay | Admitting: Internal Medicine

## 2014-03-15 DIAGNOSIS — E119 Type 2 diabetes mellitus without complications: Secondary | ICD-10-CM

## 2014-03-15 DIAGNOSIS — D62 Acute posthemorrhagic anemia: Secondary | ICD-10-CM | POA: Insufficient documentation

## 2014-03-15 DIAGNOSIS — IMO0002 Reserved for concepts with insufficient information to code with codable children: Secondary | ICD-10-CM

## 2014-03-15 DIAGNOSIS — I10 Essential (primary) hypertension: Secondary | ICD-10-CM

## 2014-03-15 DIAGNOSIS — S72009A Fracture of unspecified part of neck of unspecified femur, initial encounter for closed fracture: Secondary | ICD-10-CM

## 2014-03-15 DIAGNOSIS — M069 Rheumatoid arthritis, unspecified: Secondary | ICD-10-CM

## 2014-03-15 DIAGNOSIS — S72002A Fracture of unspecified part of neck of left femur, initial encounter for closed fracture: Secondary | ICD-10-CM

## 2014-03-15 DIAGNOSIS — F0390 Unspecified dementia without behavioral disturbance: Secondary | ICD-10-CM

## 2014-03-15 LAB — GLUCOSE, CAPILLARY: GLUCOSE-CAPILLARY: 154 mg/dL — AB (ref 70–99)

## 2014-03-15 LAB — URINE CULTURE
Colony Count: NO GROWTH
Culture: NO GROWTH

## 2014-03-15 MED ORDER — TRAMADOL HCL ER 100 MG PO TB24: ORAL_TABLET | ORAL | Status: DC

## 2014-03-15 NOTE — Assessment & Plan Note (Signed)
On insulin in hospital, on januvia on admission to SNF; Will check CBG for a week to see if she needs novolog

## 2014-03-15 NOTE — Assessment & Plan Note (Signed)
2/2 RA; may be a [problem with healing

## 2014-03-15 NOTE — Progress Notes (Signed)
MRI contacted to determine time for pt; unable to give estimated time for MRI.  Informed MRI tech that pt needs MRI asap per MD.

## 2014-03-15 NOTE — Progress Notes (Signed)
CSW (Clinical Social Worker) prepared pt dc packet and placed with shadow chart. CSW arranged non-emergent ambulance transport. Pt, pt family, pt nurse, and facility informed. CSW signing off.  Lygia Olaes, LCSWA [phone]  

## 2014-03-15 NOTE — Assessment & Plan Note (Signed)
Continue metoprolol 12.5 mg BID

## 2014-03-15 NOTE — Progress Notes (Signed)
Pt returned from MRI, unable to perform MRI because pt became agitated.  Per MRI tech, another MRI will be attempted in the morning.  Will communicate in report that patient discharge will be based on pending MRI results.

## 2014-03-15 NOTE — Assessment & Plan Note (Signed)
Continue Arava and prednisone

## 2014-03-15 NOTE — Assessment & Plan Note (Signed)
Hb dropped from 10.7 to 7.8 and pt was tx with 2 units PRBC but was unable to get a CBC prior to d/c

## 2014-03-15 NOTE — Progress Notes (Signed)
MRN: [id number] Name: Michelle Yoder  Sex: female Age: 80 y.o. DOB: 06-21-1923  [facility] #: [name] Facility/Room: 216 Level Of Care: SNF Provider: Inocencio Homes D Emergency Contacts: Extended Emergency Contact Information Primary Emergency Contact: Michelle Yoder Address: [address] [place], [place] [address] [place] Phone: [phone] Mobile Phone: [phone] Relation: Daughter  Code Status:   Allergies: Review of patient's allergies indicates no known allergies.  Chief Complaint  Patient presents with  . nursing home admission    HPI: Patient is 80 y.o. female who walked on a hip fx for 3 weeks before she underwent bipolar L hip arthroplasty. Her hospitalization was complicated by confusion, felt to be narcotics and sundowning. Pt is admitted for OT/PT.  Past Medical History  Diagnosis Date  . Arthritis   . Gout   . Osteoarthritis   . Diabetes mellitus without complication   . Hypertension   . Dementia   . Cancer   . Anemia   . GERD (gastroesophageal reflux disease)     Past Surgical History  Procedure Laterality Date  . Mastectomy    . Abdominal hysterectomy    . Hip arthroplasty Left 03/09/2014    Procedure: ARTHROPLASTY BIPOLAR HIP;  Surgeon: [name], MD;  Location: [facility];  Service: Orthopedics;  Laterality: Left;      Medication List       This list is accurate as of: 03/15/14  1:26 PM.  Always use your most recent med list.               aspirin EC 325 MG tablet  Take 1 tablet (325 mg total) by mouth daily. For 1 month, then resume ASA 81mg  daily     docusate sodium 100 MG capsule  Commonly known as:  COLACE  Take 1 capsule (100 mg total) by mouth 2 (two) times daily.     HYDROcodone-acetaminophen 5-325 MG per tablet  Commonly known as:  NORCO  Take one tablet by mouth every 6 hours as needed for moderate pain     lansoprazole 30 MG capsule  Commonly known as:  PREVACID  Take 30 mg by mouth daily  at 12 noon.     leflunomide 20 MG tablet  Commonly known as:  ARAVA  Take 20 mg by mouth daily.     LORazepam 0.5 MG tablet  Commonly known as:  ATIVAN  Take one tablet by mouth every 12 hours as needed     metoprolol tartrate 12.5 mg Tabs tablet  Commonly known as:  LOPRESSOR  Take 0.5 tablets (12.5 mg total) by mouth 2 (two) times daily.     MI-ACID GAS RELIEF 80 MG chewable tablet  Generic drug:  simethicone  Chew 80 mg by mouth 2 (two) times daily.     OVER THE COUNTER MEDICATION  Take 1 tablet by mouth daily. Medication name: Therapy M9mg -475mcg tablet     predniSONE 2.5 MG tablet  Commonly known as:  DELTASONE  Take 2.5 mg by mouth 3 (three) times daily. Take 2 tablets in the morning and 1 tablet at bed time.     sitaGLIPtin 50 MG tablet  Commonly known as:  JANUVIA  Take 50 mg by mouth daily.     traMADol 100 MG 24 hr tablet  Commonly known as:  ULTRAM-ER  Take one tablet by mouth once daily for pains     VITAMIN B COMPLEX PO  Take 1 capsule by  mouth daily.     Vitamin D-3 1000 UNITS Caps  Take 1 capsule by mouth daily.        No orders of the defined types were placed in this encounter.    There is no immunization history for the selected administration types on file for this patient.  History  Substance Use Topics  . Smoking status: Former Research scientist (life sciences)  . Smokeless tobacco: Not on file  . Alcohol Use: No    Family history is noncontributory    Review of Systems  DATA OBTAINED: from patient ;min conversant today , dementia UTO  Filed Vitals:   03/15/14 1245  BP: 154/81  Pulse: 87  Temp: 98.4 F (36.9 C)  Resp: 18    Physical Exam  GENERAL APPEARANCE: Alert, min conversant. Appropriately groomed.  SKIN: No diaphoresis rash, or wounds HEAD: Normocephalic, atraumatic  EYES: Conjunctiva/lids clear. Pupils round, reactive. EOMs intact.  EARS: External exam WNL, canals clear. Hearing grossly normal.  NOSE: No deformity or discharge.   MOUTH/THROAT: Lips w/o lesions. RESPIRATORY: Breathing is even, unlabored. Lung sounds are clear   CARDIOVASCULAR: Heart RRR no murmurs, rubs or gallops. No peripheral edema.  GASTROINTESTINAL: Abdomen is soft, non-tender, not distended w/ normal bowel sounds GENITOURINARY: Bladder non tender, not distended  MUSCULOSKELETAL: No abnormal joints or musculature NEUROLOGIC: Cranial nerves 2-12 grossly intact. Moves 3 extremities but yelled at slightrst touch to her L leg;pt in bed with R foot crossed over L ankle PSYCHIATRIC: dementia, no behavioral issues  Patient Active Problem List   Diagnosis Date Noted  . Postoperative anemia due to acute blood loss 03/15/2014  . Hip fracture, left 03/08/2014  . Femur fracture, left 03/08/2014  . RA (rheumatoid arthritis) 03/08/2014  . Chronic use of steroids 03/08/2014  . Diabetes mellitus without complication   . Dementia   . Hypertension     CBC    Component Value Date/Time   WBC 8.1 03/12/2014 0527   RBC 2.62* 03/12/2014 0527   HGB 7.8* 03/12/2014 0527   HCT 24.0* 03/12/2014 0527   PLT 258 03/12/2014 0527   MCV 91.6 03/12/2014 0527   LYMPHSABS 1.6 03/08/2014 0135   MONOABS 0.9 03/08/2014 0135   EOSABS 0.1 03/08/2014 0135   BASOSABS 0.0 03/08/2014 0135    CMP     Component Value Date/Time   NA 136* 03/11/2014 2028   K 4.6 03/11/2014 2028   CL 101 03/11/2014 2028   CO2 22 03/11/2014 2028   GLUCOSE 127* 03/11/2014 2028   BUN 32* 03/11/2014 2028   CREATININE 0.86 03/11/2014 2028   CALCIUM 8.7 03/11/2014 2028   PROT 6.4 03/08/2014 0135   ALBUMIN 2.4* 03/08/2014 0135   AST 37 03/08/2014 0135   ALT 30 03/08/2014 0135   ALKPHOS 229* 03/08/2014 0135   BILITOT 0.4 03/08/2014 0135   GFRNONAA 59* 03/11/2014 2028   GFRAA 68* 03/11/2014 2028    Assessment and Plan  Hip fracture, left , fall 3 weeks ago complicated with postoperative confusion: Orthopedics was consulted and patient underwent bipolar left hip arthroplasty, currently postop day5. Continue  pain control. Patient received 2 units packed RBCs, unable to obtain labs for repeat CBC. Her postop period was complicated by confusion and sundowning   Dementia Likely worsened due to medications, Robaxin, narcotics, worsening dementia and has hospital/ sundowning, slowly improving. She is making very slow progress towards physical therapy, will need skilled nursing facility. Minimize narcotics as tolerated. She actually is better with her mobility and mental status when her  family is around. Discharge was held on 3/17 as patient was somewhat more confused and lethargic. EEG was negative for any seizure activity. MRI brain was attempted but patient became alert and back to her baseline by the evening and got agitated during MRI attempt and the procedure was delayed. This morning 3/18, patient is alert and conversing, discussed with patient's daughter, [name] and per her request, cancelled MRI procedure as patient would need sedation for MRI. She is currently back to her baseline with no neurological deficits.    Diabetes mellitus without complication On insulin in hospital, on januvia on admission to SNF; Will check CBG for a week to see if she needs novolog  Hypertension Continue metoprolol 12.5 mg BID  RA (rheumatoid arthritis) Continue Arava and prednisone  Chronic use of steroids 2/2 RA; may be a [problem with healing  Postoperative anemia due to acute blood loss Hb dropped from 10.7 to 7.8 and pt was tx with 2 units PRBC but was unable to get a CBC prior to d/c    Hennie Duos, MD

## 2014-03-15 NOTE — Assessment & Plan Note (Signed)
Likely worsened due to medications, Robaxin, narcotics, worsening dementia and has hospital/ sundowning, slowly improving. She is making very slow progress towards physical therapy, will need skilled nursing facility. Minimize narcotics as tolerated. She actually is better with her mobility and mental status when her family is around. Discharge was held on 3/17 as patient was somewhat more confused and lethargic. EEG was negative for any seizure activity. MRI brain was attempted but patient became alert and back to her baseline by the evening and got agitated during MRI attempt and the procedure was delayed. This morning 3/18, patient is alert and conversing, discussed with patient's daughter, [name] and per her request, cancelled MRI procedure as patient would need sedation for MRI. She is currently back to her baseline with no neurological deficits.

## 2014-03-15 NOTE — Discharge Summary (Signed)
Physician Discharge Summary  Patient ID: Michelle Yoder MRN: [id number] DOB/AGE: 08-21-1923 80 y.o.  Admit date: 03/07/2014 Discharge date: 03/15/2014  Primary Care Physician:  Michelle Grant, MD  Discharge Diagnoses:    . Hip fracture, left . Hypertension . Diabetes mellitus without complication . Dementia . Femur fracture, left . RA (rheumatoid arthritis) Anemia likely due to acute blood loss anemia postoperatively transfused 2 units packed RBC  Consults:  Orthopedics, Dr. Percell Yoder   Recommendations for Outpatient Follow-up:  Weight bearing as tolerated on the left  Please continue aspirin 325 mg daily for one month then resume aspirin 81 mg daily per orthopedics recommendations for DVT prophylaxis  Allergies:  No Known Allergies   Discharge Medications:   Medication List    STOP taking these medications       aspirin 81 MG chewable tablet  Replaced by:  aspirin EC 325 MG tablet     furosemide 20 MG tablet  Commonly known as:  LASIX     gabapentin 100 MG capsule  Commonly known as:  NEURONTIN      TAKE these medications       aspirin EC 325 MG tablet  Take 1 tablet (325 mg total) by mouth daily. For 1 month, then resume ASA 81mg  daily     docusate sodium 100 MG capsule  Commonly known as:  COLACE  Take 1 capsule (100 mg total) by mouth 2 (two) times daily.     HYDROcodone-acetaminophen 5-325 MG per tablet  Commonly known as:  NORCO  Take one tablet by mouth every 6 hours as needed for moderate pain     lansoprazole 30 MG capsule  Commonly known as:  PREVACID  Take 30 mg by mouth daily at 12 noon.     leflunomide 20 MG tablet  Commonly known as:  ARAVA  Take 20 mg by mouth daily.     LORazepam 0.5 MG tablet  Commonly known as:  ATIVAN  Take one tablet by mouth every 12 hours as needed     metoprolol tartrate 12.5 mg Tabs tablet  Commonly known as:  LOPRESSOR  Take 0.5 tablets (12.5 mg total) by mouth 2 (two) times daily.     MI-ACID GAS  RELIEF 80 MG chewable tablet  Generic drug:  simethicone  Chew 80 mg by mouth 2 (two) times daily.     OVER THE COUNTER MEDICATION  Take 1 tablet by mouth daily. Medication name: Therapy M9mg -456mcg tablet     predniSONE 2.5 MG tablet  Commonly known as:  DELTASONE  Take 2.5 mg by mouth 3 (three) times daily. Take 2 tablets in the morning and 1 tablet at bed time.     sitaGLIPtin 50 MG tablet  Commonly known as:  JANUVIA  Take 50 mg by mouth daily.     traMADol 100 MG 24 hr tablet  Commonly known as:  ULTRAM-ER  Take one tablet by mouth once daily for pains     VITAMIN B COMPLEX PO  Take 1 capsule by mouth daily.     Vitamin D-3 1000 UNITS Caps  Take 1 capsule by mouth daily.         Brief H and P: For complete details please refer to admission H and P, but in brief 80 yo female with h/o RA dep on steroids, dm, htn, mild dementia comes in with 3 weeks of left hip pain. She has chronic back pain, but her left hip worsened after a fall 3 weeks ago. pts daughter  actually went to pick her up at her assisted living in [place], and was driving her here to a local assisted living. During the transportation, the pt pain got much worse. And unfortunately their car broke down enroute, by this time pts hip pain was severe so daughter called 911 from the side of the road. She normally walks with cane [name] but has had much difficulty in doing this for over 2 weeks. She had been getting injections in her left hip from a radiologist for the pain, last one was 3weeks ago, unsure if this was before or after the fall.     Hospital Course:   Hip fracture, left , fall 3 weeks ago complicated with postoperative confusion: Orthopedics was consulted and patient underwent  bipolar left hip arthroplasty, currently postop day5.  Continue pain control. Patient received 2 units packed RBCs, unable to obtain labs for repeat CBC. Her postop period was complicated by confusion and sundowning.   Acute  encephalopathy on dementia: Likely worsened due to medications, Robaxin, narcotics, worsening dementia and has hospital/ sundowning, slowly improving. She is making very slow progress towards physical therapy, will need skilled nursing facility. Minimize narcotics as tolerated. She actually is better with her mobility and mental status when her family is around. Discharge was held on 3/17 as patient was somewhat more confused and lethargic. EEG was negative for any seizure activity. MRI brain was attempted but patient became alert and back to her baseline by the evening and got agitated during MRI attempt and the procedure was delayed. This morning 3/18, patient is alert and conversing, discussed with patient's daughter, Michelle Yoder and per her request, cancelled MRI procedure as patient would need sedation for MRI. She is currently back to her baseline with no neurological deficits.      Diabetes mellitus without complication - Placed on sliding scale insulin inpatient, continue carb modified diet  Hypertension - Currently stable   RA (rheumatoid arthritis): Continue Arava and prednisone   Anemia: Likely postop  - Hemoglobin went down to 7.8 03/12/2014, transfused 2 units packed RBC but unable to obtain CBC. She did spike low grade temp on 3/16. CXR did not show any acute pneumonia , UA showed no UTI patient has been afebrile since then.     Day of Discharge BP 125/51  Pulse 88  Temp(Src) 98.5 F (36.9 C) (Oral)  Resp 18  Ht 5\' 4"  (1.626 m)  Wt 60.5 kg (133 lb 6.1 oz)  BMI 22.88 kg/m2  SpO2 96%  Physical Exam:  General: alert and oriented, NAD, pleasant, hearing deficit CVS: S1-S2 clear, no murmur rubs or gallops  Chest: clear to auscultation bilaterally, no wheezing, rales or rhonchi  Abdomen: soft nontender, nondistended, normal bowel sounds  Extremities: no cyanosis, clubbing or edema noted bilaterally      The results of significant diagnostics from this hospitalization (including  imaging, microbiology, ancillary and laboratory) are listed below for reference.    LAB RESULTS: Basic Metabolic Panel:  Recent Labs Lab 03/10/14 0405 03/11/14 2028  NA 137 136*  K 4.4 4.6  CL 99 101  CO2 23 22  GLUCOSE 131* 127*  BUN 29* 32*  CREATININE 1.07 0.86  CALCIUM 8.6 8.7   Liver Function Tests: No results found for this basename: AST, ALT, ALKPHOS, BILITOT, PROT, ALBUMIN,  in the last 168 hours No results found for this basename: LIPASE, AMYLASE,  in the last 168 hours No results found for this basename: AMMONIA,  in the last 168  hours CBC:  Recent Labs Lab 03/11/14 2310 03/12/14 0527  WBC 9.7 8.1  HGB 8.2* 7.8*  HCT 25.1* 24.0*  MCV 92.3 91.6  PLT 283 258   Cardiac Enzymes: No results found for this basename: CKTOTAL, CKMB, CKMBINDEX, TROPONINI,  in the last 168 hours BNP: No components found with this basename: POCBNP,  CBG:  Recent Labs Lab 03/14/14 2118 03/15/14 0653  GLUCAP 127* 154*    Significant Diagnostic Studies:  Dg Lumbar Spine Complete  03/08/2014   CLINICAL DATA Low back pain, no known injury.  EXAM LUMBAR SPINE - COMPLETE 4+ VIEW  COMPARISON None.  FINDINGS Multilevel degenerative changes with osteophyte formation and disc height loss. Diffuse osteopenia. There is mild superior endplate height loss at L5. Maintained vertebral body alignment. Atherosclerotic vascular calcifications. Lower lumbar/lumbosacral facet arthropathy.  IMPRESSION Multilevel degenerative changes. Mild superior endplate height loss at L5 is age indeterminate. Correlate for point tenderness.  SIGNATURE  Electronically Signed   By: Carlos Levering M.D.   On: 03/08/2014 01:34   Dg Hip Complete Left  03/08/2014   CLINICAL DATA Left hip pain.  EXAM LEFT HIP - COMPLETE 2+ VIEW  COMPARISON None.  FINDINGS Severe deformity of left femoral head is noted consistent with avascular necrosis. Subsequent degenerative joint disease of the left hip is noted. There is noted some  lucency in the region of the left femoral neck which may represent fracture.  IMPRESSION Severe deformity of left femoral head is noted consistent with avascular necrosis and subsequent degenerative joint disease of the left hip. Curvilinear lucency is seen below the left femoral head which may suggest fracture ; CT scan of the hip is recommended for further evaluation.  SIGNATURE  Electronically Signed   By: Sabino Dick M.D.   On: 03/08/2014 01:30   Ct Hip Left Wo Contrast  03/08/2014   CLINICAL DATA Left hip pain and left upper leg pain for 2 weeks.  EXAM CT OF THE LEFT HIP WITHOUT CONTRAST  TECHNIQUE Multidetector CT imaging was performed according to the standard protocol. Multiplanar CT image reconstructions were also generated.  COMPARISON DG HIP COMPLETE*L* dated 03/08/2014  FINDINGS Comminuted acute to subacute appearing fracture of the femoral head with impaction and sclerotic femoral head fragments. The fracture extends from the articular surface to the femoral head neck junction. No dislocation.  Subcentimeter apparent loose bodies extending into the iliopsoas bursa.  Bone mineral density is decreased with enthesopathy at greater trochanter. No destructive bony lesions. Limited view of the pelvis demonstrates sigmoid diverticulosis and phleboliths.  IMPRESSION Comminuted femoral subcapital femur fracture extending through the head to the neck junction with impaction. Findings may reflect subacute insufficiency fracture. No dislocation.  Please note, mildly sclerotic fracture fragments may reflect impaction, though are nonspecific. This is highly unlikely to reflect pathologic fracture.  SIGNATURE  Electronically Signed   By: Elon Alas   On: 03/08/2014 02:50       Disposition and Follow-up: Discharge Orders   Future Appointments Provider Department Dept Phone   07/10/2014 9:30 AM Rowe Clack, MD [facility] [phone]   Future Orders Complete By  Expires   Diet Carb Modified  As directed    Increase activity slowly  As directed    Weight bearing as tolerated  As directed    Questions:     Laterality:     Extremity:         DISPOSITION: Skilled nursing facility  DIET carb modified   DISCHARGE  FOLLOW-UP Follow-up Information   Follow up with MURPHY, TIMOTHY, D, MD In 2 weeks. (or as scheduled)    Specialty:  Orthopedic Surgery   Contact information:   [address] [place] [id number] [phone]       Time spent on Discharge: 40 minutes  Signed:   RAI,RIPUDEEP M.D. [facility] 03/15/2014, 8:38 AM Pager: [phone]

## 2014-03-15 NOTE — Assessment & Plan Note (Signed)
,   fall 3 weeks ago complicated with postoperative confusion: Orthopedics was consulted and patient underwent bipolar left hip arthroplasty, currently postop day5. Continue pain control. Patient received 2 units packed RBCs, unable to obtain labs for repeat CBC. Her postop period was complicated by confusion and sundowning

## 2014-03-20 ENCOUNTER — Encounter: Payer: Self-pay | Admitting: Internal Medicine

## 2014-03-20 ENCOUNTER — Non-Acute Institutional Stay (SKILLED_NURSING_FACILITY): Payer: Medicare Other | Admitting: Internal Medicine

## 2014-03-20 DIAGNOSIS — S72002A Fracture of unspecified part of neck of left femur, initial encounter for closed fracture: Secondary | ICD-10-CM

## 2014-03-20 DIAGNOSIS — T8140XA Infection following a procedure, unspecified, initial encounter: Secondary | ICD-10-CM

## 2014-03-20 DIAGNOSIS — S72009A Fracture of unspecified part of neck of unspecified femur, initial encounter for closed fracture: Secondary | ICD-10-CM

## 2014-03-20 DIAGNOSIS — T8141XA Infection following a procedure, superficial incisional surgical site, initial encounter: Secondary | ICD-10-CM

## 2014-03-20 NOTE — Assessment & Plan Note (Signed)
The incision line L hip has dehissed slightly at both ends. The proximal end is red with no drainage. Could be early infection  Vs inflammation of healing. Doxycycline 100 mg BID for 5 days.

## 2014-03-20 NOTE — Progress Notes (Signed)
MRN: [id number] Name: Michelle Yoder  Sex: female Age: 80 y.o. DOB: Sep 22, 1923  [place] #: [name] Facility/Room: 216a Level Of Care: SNF Provider: Inocencio Homes D Emergency Contacts: Extended Emergency Contact Information Primary Emergency Contact: [name],[name] Address: [address] [place], [place] [address] [place] Phone: [phone] Mobile Phone: [phone] Relation: Daughter  Code Status:   Allergies: Review of patient's allergies indicates no known allergies.  Chief Complaint  Patient presents with  . Acute Visit    HPI: Patient is 80 y.o. female who the nurse asked me to see to asses her hip repair incision site.  Past Medical History  Diagnosis Date  . Arthritis   . Gout   . Osteoarthritis   . Diabetes mellitus without complication   . Hypertension   . Dementia   . Cancer   . Anemia   . GERD (gastroesophageal reflux disease)     Past Surgical History  Procedure Laterality Date  . Mastectomy    . Abdominal hysterectomy    . Hip arthroplasty Left 03/09/2014    Procedure: ARTHROPLASTY BIPOLAR HIP;  Surgeon: [name], MD;  Location: [facility];  Service: Orthopedics;  Laterality: Left;      Medication List       This list is accurate as of: 03/20/14 11:50 PM.  Always use your most recent med list.               aspirin EC 325 MG tablet  Take 1 tablet (325 mg total) by mouth daily. For 1 month, then resume ASA 81mg  daily     docusate sodium 100 MG capsule  Commonly known as:  COLACE  Take 1 capsule (100 mg total) by mouth 2 (two) times daily.     HYDROcodone-acetaminophen 5-325 MG per tablet  Commonly known as:  NORCO  Take one tablet by mouth every 6 hours as needed for moderate pain     lansoprazole 30 MG capsule  Commonly known as:  PREVACID  Take 30 mg by mouth daily at 12 noon.     leflunomide 20 MG tablet  Commonly known as:  ARAVA  Take 20 mg by mouth daily.     LORazepam 0.5 MG tablet   Commonly known as:  ATIVAN  Take one tablet by mouth every 12 hours as needed     metoprolol tartrate 12.5 mg Tabs tablet  Commonly known as:  LOPRESSOR  Take 0.5 tablets (12.5 mg total) by mouth 2 (two) times daily.     MI-ACID GAS RELIEF 80 MG chewable tablet  Generic drug:  simethicone  Chew 80 mg by mouth 2 (two) times daily.     OVER THE COUNTER MEDICATION  Take 1 tablet by mouth daily. Medication name: Therapy M9mg -481mcg tablet     predniSONE 2.5 MG tablet  Commonly known as:  DELTASONE  Take 2.5 mg by mouth 3 (three) times daily. Take 2 tablets in the morning and 1 tablet at bed time.     sitaGLIPtin 50 MG tablet  Commonly known as:  JANUVIA  Take 50 mg by mouth daily.     traMADol 100 MG 24 hr tablet  Commonly known as:  ULTRAM-ER  Take one tablet by mouth once daily for pains     VITAMIN B COMPLEX PO  Take 1 capsule by mouth daily.     Vitamin D-3 1000 UNITS Caps  Take 1 capsule by mouth daily.  No orders of the defined types were placed in this encounter.    There is no immunization history for the selected administration types on file for this patient.  History  Substance Use Topics  . Smoking status: Former Research scientist (life sciences)  . Smokeless tobacco: Not on file  . Alcohol Use: No    Review of Systems  DATA OBTAINED: from nurse; pt has dementia-UTO;per nurse there is concern that incision may be getting infected   Filed Vitals:   03/20/14 2343  BP: 128/67  Pulse: 59  Temp: 96.8 F (36 C)  Resp: 18    Physical Exam  GENERAL APPEARANCE: Alert, conversant; No acute distress  SKIN: No diaphoresis rash; both ends of incision site have dehissed slightly; the proximal end is red without draiange HEENT: Unremarkable RESPIRATORY: Breathing is even, unlabored. Lung sounds are clear   CARDIOVASCULAR: Heart RRR no murmurs, rubs or gallops. No peripheral edema  GASTROINTESTINAL: Abdomen is soft, non-tender, not distended w/ normal bowel sounds.   GENITOURINARY: Bladder non tender, not distended  MUSCULOSKELETAL: No abnormal joints or musculature NEUROLOGIC: Cranial nerves 2-12 grossly intact. Moves all extremities  PSYCHIATRIC: Dementia, confused; no behavioral issues  Patient Active Problem List   Diagnosis Date Noted  . Postoperative anemia due to acute blood loss 03/15/2014  . Hip fracture, left 03/08/2014  . Femur fracture, left 03/08/2014  . RA (rheumatoid arthritis) 03/08/2014  . Chronic use of steroids 03/08/2014  . Diabetes mellitus without complication   . Dementia   . Hypertension     CBC    Component Value Date/Time   WBC 8.1 03/12/2014 0527   RBC 2.62* 03/12/2014 0527   HGB 7.8* 03/12/2014 0527   HCT 24.0* 03/12/2014 0527   PLT 258 03/12/2014 0527   MCV 91.6 03/12/2014 0527   LYMPHSABS 1.6 03/08/2014 0135   MONOABS 0.9 03/08/2014 0135   EOSABS 0.1 03/08/2014 0135   BASOSABS 0.0 03/08/2014 0135    CMP     Component Value Date/Time   NA 136* 03/11/2014 2028   K 4.6 03/11/2014 2028   CL 101 03/11/2014 2028   CO2 22 03/11/2014 2028   GLUCOSE 127* 03/11/2014 2028   BUN 32* 03/11/2014 2028   CREATININE 0.86 03/11/2014 2028   CALCIUM 8.7 03/11/2014 2028   PROT 6.4 03/08/2014 0135   ALBUMIN 2.4* 03/08/2014 0135   AST 37 03/08/2014 0135   ALT 30 03/08/2014 0135   ALKPHOS 229* 03/08/2014 0135   BILITOT 0.4 03/08/2014 0135   GFRNONAA 59* 03/11/2014 2028   GFRAA 68* 03/11/2014 2028    Assessment and Plan  Hip fracture, left The incision line L hip has dehissed slightly at both ends. The proximal end is red with no drainage. Could be early infection  Vs inflammation of healing. Doxycycline 100 mg BID for 5 days.    Hennie Duos, MD

## 2014-03-27 ENCOUNTER — Encounter: Payer: Self-pay | Admitting: Internal Medicine

## 2014-03-27 ENCOUNTER — Non-Acute Institutional Stay (SKILLED_NURSING_FACILITY): Payer: Medicare Other | Admitting: Internal Medicine

## 2014-03-27 DIAGNOSIS — S72009A Fracture of unspecified part of neck of unspecified femur, initial encounter for closed fracture: Secondary | ICD-10-CM

## 2014-03-27 DIAGNOSIS — S72002A Fracture of unspecified part of neck of left femur, initial encounter for closed fracture: Secondary | ICD-10-CM

## 2014-03-27 DIAGNOSIS — D62 Acute posthemorrhagic anemia: Secondary | ICD-10-CM

## 2014-03-27 DIAGNOSIS — R630 Anorexia: Secondary | ICD-10-CM

## 2014-03-27 DIAGNOSIS — H547 Unspecified visual loss: Secondary | ICD-10-CM

## 2014-03-27 DIAGNOSIS — I10 Essential (primary) hypertension: Secondary | ICD-10-CM

## 2014-03-27 DIAGNOSIS — F0390 Unspecified dementia without behavioral disturbance: Secondary | ICD-10-CM

## 2014-03-27 NOTE — Assessment & Plan Note (Signed)
BP is a little high but will not change anything now.

## 2014-03-27 NOTE — Assessment & Plan Note (Signed)
Pt does not seem to be in pain;per daughter she thinks her Mom's pain is mental and she is concerned about her getting too much;therefore will d/c norco, d/c ultram 100 mg po daily and change ultram to 50 mg po q6

## 2014-03-27 NOTE — Assessment & Plan Note (Signed)
Daughter has concern for decreased vision in Mom's Left eye; it was going on in hospital but they could not MR her because she was  post op with staples; will send pt to Neurologist as out pt

## 2014-03-27 NOTE — Progress Notes (Signed)
MRN: [id number] Name: Michelle Yoder  Sex: female Age: 80 y.o. DOB: 09-08-1923  [place] #: [name] Facility/Room: 216 Level Of Care: SNF Provider: Inocencio Homes D Emergency Contacts: Extended Emergency Contact Information Primary Emergency Contact: Yoder,Michelle T Address: [address] [place], [place] [address] [place] Phone: [phone] Mobile Phone: [phone] Relation: Daughter  Code Status: FULL  Allergies: Review of patient's allergies indicates no known allergies.  Chief Complaint  Patient presents with  . Medical Managment of Chronic Issues    HPI: Patient is 80 y.o. female whose daughter has several concerns about Mom. These are addressed under A/P.   Past Medical History  Diagnosis Date  . Arthritis   . Gout   . Osteoarthritis   . Diabetes mellitus without complication   . Hypertension   . Dementia   . Cancer   . Anemia   . GERD (gastroesophageal reflux disease)     Past Surgical History  Procedure Laterality Date  . Mastectomy    . Abdominal hysterectomy    . Hip arthroplasty Left 03/09/2014    Procedure: ARTHROPLASTY BIPOLAR HIP;  Surgeon: [name], MD;  Location: [facility];  Service: Orthopedics;  Laterality: Left;      Medication List       This list is accurate as of: 03/27/14  2:27 PM.  Always use your most recent med list.               aspirin EC 325 MG tablet  Take 1 tablet (325 mg total) by mouth daily. For 1 month, then resume ASA 81mg  daily     docusate sodium 100 MG capsule  Commonly known as:  COLACE  Take 1 capsule (100 mg total) by mouth 2 (two) times daily.     HYDROcodone-acetaminophen 5-325 MG per tablet  Commonly known as:  NORCO  Take one tablet by mouth every 6 hours as needed for moderate pain     lansoprazole 30 MG capsule  Commonly known as:  PREVACID  Take 30 mg by mouth daily at 12 noon.     leflunomide 20 MG tablet  Commonly known as:  ARAVA  Take 20 mg by mouth daily.      LORazepam 0.5 MG tablet  Commonly known as:  ATIVAN  Take one tablet by mouth every 12 hours as needed     metoprolol tartrate 12.5 mg Tabs tablet  Commonly known as:  LOPRESSOR  Take 0.5 tablets (12.5 mg total) by mouth 2 (two) times daily.     MI-ACID GAS RELIEF 80 MG chewable tablet  Generic drug:  simethicone  Chew 80 mg by mouth 2 (two) times daily.     OVER THE COUNTER MEDICATION  Take 1 tablet by mouth daily. Medication name: Therapy M9mg -437mcg tablet     predniSONE 2.5 MG tablet  Commonly known as:  DELTASONE  Take 2.5 mg by mouth 3 (three) times daily. Take 2 tablets in the morning and 1 tablet at bed time.     sitaGLIPtin 50 MG tablet  Commonly known as:  JANUVIA  Take 50 mg by mouth daily.     traMADol 100 MG 24 hr tablet  Commonly known as:  ULTRAM-ER  Take one tablet by mouth once daily for pains     VITAMIN B COMPLEX PO  Take 1 capsule by mouth daily.     Vitamin D-3 1000 UNITS Caps  Take 1 capsule by mouth daily.  No orders of the defined types were placed in this encounter.    There is no immunization history for the selected administration types on file for this patient.  History  Substance Use Topics  . Smoking status: Former Research scientist (life sciences)  . Smokeless tobacco: Not on file  . Alcohol Use: No    Review of Systems  DATA OBTAINED: from patient's daughter GENERAL: Feels well no fevers, fatigue,decreased  appetite SKIN: No itching, rash HEENT: decreased vision L eye;first started while pt was in hospital RESPIRATORY: No cough, wheezing, SOB CARDIAC: No chest pain, palpitations, lower extremity edema  GI: No abdominal pain, No N/V/D or constipation, No heartburn or reflux  GU: No dysuria, frequency or urgency, or incontinence  MUSCULOSKELETAL: No unrelieved bone/joint pain NEUROLOGIC: No headache, dizziness or focal weakness; too sleepy during day PSYCHIATRIC: Dementia worse, agitated at times Sleeps well.   Filed Vitals:   03/27/14  1310  BP: 161/81  Pulse: 100  Temp: 98.4 F (36.9 C)  Resp: 20    Physical Exam  GENERAL APPEARANCE: Alert, conversant. Appropriately groomed. No acute distress  SKIN: No diaphoresis rash; L hip incision healing without infection HEENT: L eye Unremarkable; can not tell reliably if pt cant see from it; pupil is not reactive RESPIRATORY: Breathing is even, unlabored. Lung sounds are clear   CARDIOVASCULAR: Heart RRR no murmurs, rubs or gallops. No peripheral edema  GASTROINTESTINAL: Abdomen is soft, non-tender, not distended w/ normal bowel sounds.  GENITOURINARY: Bladder non tender, not distended  MUSCULOSKELETAL: No abnormal joints or musculature NEUROLOGIC: Cranial nerves 2-12 grossly intact. Moves all extremities  PSYCHIATRIC: dementia, no behavioral issues  Patient Active Problem List   Diagnosis Date Noted  . Vision problem 03/27/2014  . Postoperative anemia due to acute blood loss 03/15/2014  . Hip fracture, left 03/08/2014  . Femur fracture, left 03/08/2014  . RA (rheumatoid arthritis) 03/08/2014  . Chronic use of steroids 03/08/2014  . Diabetes mellitus without complication   . Dementia   . Hypertension     CBC    Component Value Date/Time   WBC 8.1 03/12/2014 0527   RBC 2.62* 03/12/2014 0527   HGB 7.8* 03/12/2014 0527   HCT 24.0* 03/12/2014 0527   PLT 258 03/12/2014 0527   MCV 91.6 03/12/2014 0527   LYMPHSABS 1.6 03/08/2014 0135   MONOABS 0.9 03/08/2014 0135   EOSABS 0.1 03/08/2014 0135   BASOSABS 0.0 03/08/2014 0135    CMP     Component Value Date/Time   NA 136* 03/11/2014 2028   K 4.6 03/11/2014 2028   CL 101 03/11/2014 2028   CO2 22 03/11/2014 2028   GLUCOSE 127* 03/11/2014 2028   BUN 32* 03/11/2014 2028   CREATININE 0.86 03/11/2014 2028   CALCIUM 8.7 03/11/2014 2028   PROT 6.4 03/08/2014 0135   ALBUMIN 2.4* 03/08/2014 0135   AST 37 03/08/2014 0135   ALT 30 03/08/2014 0135   ALKPHOS 229* 03/08/2014 0135   BILITOT 0.4 03/08/2014 0135   GFRNONAA 59* 03/11/2014 2028    GFRAA 68* 03/11/2014 2028    Assessment and Plan  Hip fracture, left Pt does not seem to be in pain;per daughter she thinks her Mom's pain is mental and she is concerned about her getting too much;therefore will d/c norco, d/c ultram 100 mg po daily and change ultram to 50 mg po q6   Dementia Worse since husband died, broke hip and moved here and was in hospital, now SNF. I discussed with daughter about Ativan and ahe  agrees it is good to have a t night for sleep and needed during day for agitation  Vision problem Daughter has concern for decreased vision in Mom's Left eye; it was going on in hospital but they could not MR her because she was  post op with staples; will send pt to Neurologist as out pt  Hypertension BP is a little high but will not change anything now.  Postoperative anemia due to acute blood loss CBC to be checked tomorrow.  DECREASED APPETITE- will start remeron 7.5 mg qHS  Hennie Duos, MD

## 2014-03-27 NOTE — Assessment & Plan Note (Signed)
Worse since husband died, broke hip and moved here and was in hospital, now SNF. I discussed with daughter about Ativan and ahe agrees it is good to have a t night for sleep and needed during day for agitation

## 2014-03-27 NOTE — Assessment & Plan Note (Signed)
CBC to be checked tomorrow.

## 2014-04-02 ENCOUNTER — Inpatient Hospital Stay (HOSPITAL_COMMUNITY)
Admission: EM | Admit: 2014-04-02 | Discharge: 2014-04-07 | DRG: 862 | Disposition: A | Discharge status: Skilled Nursing Facility | Payer: Medicare Other | Attending: Internal Medicine | Admitting: Internal Medicine

## 2014-04-02 ENCOUNTER — Encounter (HOSPITAL_COMMUNITY): Payer: Self-pay | Admitting: Emergency Medicine

## 2014-04-02 ENCOUNTER — Emergency Department (HOSPITAL_COMMUNITY): Payer: Medicare Other

## 2014-04-02 DIAGNOSIS — I10 Essential (primary) hypertension: Secondary | ICD-10-CM | POA: Diagnosis present

## 2014-04-02 DIAGNOSIS — M109 Gout, unspecified: Secondary | ICD-10-CM | POA: Diagnosis present

## 2014-04-02 DIAGNOSIS — F03918 Unspecified dementia, unspecified severity, with other behavioral disturbance: Secondary | ICD-10-CM | POA: Diagnosis present

## 2014-04-02 DIAGNOSIS — F0391 Unspecified dementia with behavioral disturbance: Secondary | ICD-10-CM | POA: Diagnosis present

## 2014-04-02 DIAGNOSIS — F05 Delirium due to known physiological condition: Secondary | ICD-10-CM

## 2014-04-02 DIAGNOSIS — Z87891 Personal history of nicotine dependence: Secondary | ICD-10-CM

## 2014-04-02 DIAGNOSIS — F411 Generalized anxiety disorder: Secondary | ICD-10-CM | POA: Diagnosis present

## 2014-04-02 DIAGNOSIS — A419 Sepsis, unspecified organism: Secondary | ICD-10-CM | POA: Diagnosis present

## 2014-04-02 DIAGNOSIS — I472 Ventricular tachycardia, unspecified: Secondary | ICD-10-CM | POA: Diagnosis present

## 2014-04-02 DIAGNOSIS — Y838 Other surgical procedures as the cause of abnormal reaction of the patient, or of later complication, without mention of misadventure at the time of the procedure: Secondary | ICD-10-CM | POA: Diagnosis present

## 2014-04-02 DIAGNOSIS — W050XXA Fall from non-moving wheelchair, initial encounter: Secondary | ICD-10-CM | POA: Diagnosis present

## 2014-04-02 DIAGNOSIS — S7292XA Unspecified fracture of left femur, initial encounter for closed fracture: Secondary | ICD-10-CM

## 2014-04-02 DIAGNOSIS — Z96649 Presence of unspecified artificial hip joint: Secondary | ICD-10-CM

## 2014-04-02 DIAGNOSIS — M1990 Unspecified osteoarthritis, unspecified site: Secondary | ICD-10-CM | POA: Diagnosis present

## 2014-04-02 DIAGNOSIS — I4729 Other ventricular tachycardia: Secondary | ICD-10-CM | POA: Diagnosis present

## 2014-04-02 DIAGNOSIS — R6510 Systemic inflammatory response syndrome (SIRS) of non-infectious origin without acute organ dysfunction: Secondary | ICD-10-CM

## 2014-04-02 DIAGNOSIS — IMO0002 Reserved for concepts with insufficient information to code with codable children: Secondary | ICD-10-CM

## 2014-04-02 DIAGNOSIS — Z7982 Long term (current) use of aspirin: Secondary | ICD-10-CM

## 2014-04-02 DIAGNOSIS — S72002A Fracture of unspecified part of neck of left femur, initial encounter for closed fracture: Secondary | ICD-10-CM

## 2014-04-02 DIAGNOSIS — Y92009 Unspecified place in unspecified non-institutional (private) residence as the place of occurrence of the external cause: Secondary | ICD-10-CM

## 2014-04-02 DIAGNOSIS — M069 Rheumatoid arthritis, unspecified: Secondary | ICD-10-CM

## 2014-04-02 DIAGNOSIS — F0390 Unspecified dementia without behavioral disturbance: Secondary | ICD-10-CM

## 2014-04-02 DIAGNOSIS — T8140XA Infection following a procedure, unspecified, initial encounter: Principal | ICD-10-CM | POA: Diagnosis present

## 2014-04-02 DIAGNOSIS — K219 Gastro-esophageal reflux disease without esophagitis: Secondary | ICD-10-CM | POA: Diagnosis present

## 2014-04-02 DIAGNOSIS — B965 Pseudomonas (aeruginosa) (mallei) (pseudomallei) as the cause of diseases classified elsewhere: Secondary | ICD-10-CM | POA: Diagnosis present

## 2014-04-02 DIAGNOSIS — E119 Type 2 diabetes mellitus without complications: Secondary | ICD-10-CM

## 2014-04-02 DIAGNOSIS — A4189 Other specified sepsis: Secondary | ICD-10-CM | POA: Diagnosis present

## 2014-04-02 NOTE — ED Notes (Signed)
Per ems, pt from [facility], according to staff "Pt seems to have fallen out of wheelchair". unwitnessed fall. Small dried blood noted to back of head. Pt had hip surgery on the 13th of march. Pt had bandage applied to left hip from surgery. Equal grip strength. Hx of late stage dementia. No shortening or rotation of either hip. Pt initially told ems she "hurts all over". Family at bedside. Pt in NAD. Pt is alert, answering questions, but not answering any quetsions correctly. This is pt baseline. Pt states shes 62.

## 2014-04-02 NOTE — ED Provider Notes (Signed)
CSN: [id number]     Arrival date & time 04/02/14  2129 History   First MD Initiated Contact with Patient 04/02/14 2158     Chief Complaint  Patient presents with  . Fall     (Consider location/radiation/quality/duration/timing/severity/associated sxs/prior Treatment) Patient is a 80 y.o. female presenting with altered mental status.  Altered Mental Status Presenting symptoms: confusion   Presenting symptoms comment:  Somnolence Severity:  Severe Most recent episode:  Today Episode history:  Single Duration:  1 day Timing:  Constant Progression:  Unchanged Chronicity:  New Context: dementia and nursing home resident   Context comment:  Recent left hip fracture and repair. Associated symptoms: decreased appetite and weakness   Associated symptoms: no difficulty breathing     Past Medical History  Diagnosis Date  . Arthritis   . Gout   . Osteoarthritis   . Diabetes mellitus without complication   . Hypertension   . Dementia   . Cancer   . Anemia   . GERD (gastroesophageal reflux disease)    Past Surgical History  Procedure Laterality Date  . Mastectomy    . Abdominal hysterectomy    . Hip arthroplasty Left 03/09/2014    Procedure: ARTHROPLASTY BIPOLAR HIP;  Surgeon: [name], MD;  Location: [facility];  Service: Orthopedics;  Laterality: Left;   No family history on file. History  Substance Use Topics  . Smoking status: Former Research scientist (life sciences)  . Smokeless tobacco: Not on file  . Alcohol Use: No   OB History   Grav Para Term Preterm Abortions TAB SAB Ect Mult Living                 Review of Systems  Unable to perform ROS: Dementia  Constitutional: Positive for decreased appetite.  Neurological: Positive for weakness.  Psychiatric/Behavioral: Positive for confusion.      Allergies  Review of patient's allergies indicates no known allergies.  Home Medications   Current Outpatient Rx  Name  Route  Sig  Dispense  Refill  . aspirin EC 325 MG tablet   Oral  Take 1 tablet (325 mg total) by mouth daily. For 1 month, then resume ASA 81mg  daily   30 tablet   0   . B Complex Vitamins (VITAMIN B COMPLEX PO)   Oral   Take 1 capsule by mouth daily.         . Cholecalciferol (VITAMIN D-3) 1000 UNITS CAPS   Oral   Take 1 capsule by mouth daily.         Marland Kitchen docusate sodium (COLACE) 100 MG capsule   Oral   Take 1 capsule (100 mg total) by mouth 2 (two) times daily.   10 capsule   0   . HYDROcodone-acetaminophen (NORCO) 5-325 MG per tablet      Take one tablet by mouth every 6 hours as needed for moderate pain   120 tablet   0   . lansoprazole (PREVACID) 30 MG capsule   Oral   Take 30 mg by mouth daily at 12 noon.         . leflunomide (ARAVA) 20 MG tablet   Oral   Take 20 mg by mouth daily.         Marland Kitchen LORazepam (ATIVAN) 0.5 MG tablet      Take one tablet by mouth every 12 hours as needed   60 tablet   5   . metoprolol tartrate (LOPRESSOR) 12.5 mg TABS tablet   Oral   Take  0.5 tablets (12.5 mg total) by mouth 2 (two) times daily.         Marland Kitchen OVER THE COUNTER MEDICATION   Oral   Take 1 tablet by mouth daily. Medication name: Therapy M9mg -491mcg tablet         . predniSONE (DELTASONE) 2.5 MG tablet   Oral   Take 2.5 mg by mouth 3 (three) times daily. Take 2 tablets in the morning and 1 tablet at bed time.         . simethicone (MI-ACID GAS RELIEF) 80 MG chewable tablet   Oral   Chew 80 mg by mouth 2 (two) times daily.         . sitaGLIPtin (JANUVIA) 50 MG tablet   Oral   Take 50 mg by mouth daily.         . traMADol (ULTRAM-ER) 100 MG 24 hr tablet      Take one tablet by mouth once daily for pains   30 tablet   0    BP 142/78  Pulse 124  Temp(Src) 98.6 F (37 C) (Oral)  Resp 16  SpO2 95% Physical Exam  Nursing note and vitals reviewed. Constitutional: She appears well-developed and well-nourished. No distress.  HENT:  Head: Normocephalic and atraumatic.  Mouth/Throat: Oropharynx is clear and  moist.  Eyes: Conjunctivae are normal. Pupils are equal, round, and reactive to light. No scleral icterus.  Neck: Neck supple.  Cardiovascular: Normal rate, regular rhythm, normal heart sounds and intact distal pulses.   No murmur heard. Pulmonary/Chest: Effort normal and breath sounds normal. No stridor. No respiratory distress. She has no rales.  Abdominal: Soft. Bowel sounds are normal. She exhibits no distension. There is no tenderness.  Musculoskeletal: Normal range of motion.  Left hip: Full range of motion, 2+ distal pulses, mild tenderness to palpation, incisional wound is clean, with mild separation at the superior aspect, no purulence, no erythema.  Neurological: She is disoriented. GCS eye subscore is 2. GCS verbal subscore is 4. GCS motor subscore is 6.  Somnolent but arousable Moves all extremities  Skin: Skin is warm and dry. No rash noted.  Psychiatric: She has a normal mood and affect. Her behavior is normal.    ED Course  Procedures (including critical care time) Labs Review Labs Reviewed - No data to display Imaging Review No results found.   EKG Interpretation None      MDM   Clinical Impression: Altered Mental Status  80 year old female with somnolence and altered metal status. Her daughter provides the history. States that the symptoms have been going on throughout the day today. She also reports that the nursing facility told her her mother was found on the ground next to her wheelchair. She presumably fell, but this was unwitnessed. From a fall standpoint, her head and neck CTs were negative. She had no signs of injuries. Plan for metabolic and infectious workup.  Care transferred to Dr. Marnette Burgess pending workup.    Houston Siren III, MD 04/04/14 [phone]

## 2014-04-02 NOTE — ED Notes (Signed)
Attempted to start IV, unsuccessful. IV team paged.

## 2014-04-02 NOTE — ED Notes (Signed)
IV team unable to get blood. Phlebotomy notified, and will attempt to draw blood.

## 2014-04-03 ENCOUNTER — Emergency Department (HOSPITAL_COMMUNITY): Payer: Medicare Other

## 2014-04-03 DIAGNOSIS — M79609 Pain in unspecified limb: Secondary | ICD-10-CM

## 2014-04-03 DIAGNOSIS — E119 Type 2 diabetes mellitus without complications: Secondary | ICD-10-CM

## 2014-04-03 DIAGNOSIS — F05 Delirium due to known physiological condition: Secondary | ICD-10-CM

## 2014-04-03 DIAGNOSIS — R6510 Systemic inflammatory response syndrome (SIRS) of non-infectious origin without acute organ dysfunction: Secondary | ICD-10-CM

## 2014-04-03 LAB — COMPREHENSIVE METABOLIC PANEL
ALBUMIN: 2 g/dL — AB (ref 3.5–5.2)
ALK PHOS: 407 U/L — AB (ref 39–117)
ALT: 39 U/L — AB (ref 0–35)
AST: 57 U/L — ABNORMAL HIGH (ref 0–37)
BUN: 18 mg/dL (ref 6–23)
CO2: 24 mEq/L (ref 19–32)
Calcium: 8.9 mg/dL (ref 8.4–10.5)
Chloride: 101 mEq/L (ref 96–112)
Creatinine, Ser: 0.87 mg/dL (ref 0.50–1.10)
GFR calc Af Amer: 67 mL/min — ABNORMAL LOW (ref >90)
GFR calc non Af Amer: 58 mL/min — ABNORMAL LOW (ref >90)
Glucose, Bld: 124 mg/dL — ABNORMAL HIGH (ref 70–99)
Potassium: 4 mEq/L (ref 3.7–5.3)
Sodium: 139 mEq/L (ref 137–147)
Total Bilirubin: 0.6 mg/dL (ref 0.3–1.2)
Total Protein: 6.3 g/dL (ref 6.0–8.3)

## 2014-04-03 LAB — URINALYSIS, ROUTINE W REFLEX MICROSCOPIC
GLUCOSE, UA: NEGATIVE mg/dL
Hgb urine dipstick: NEGATIVE
KETONES UR: 15 mg/dL — AB
Leukocytes, UA: NEGATIVE
Nitrite: NEGATIVE
Protein, ur: 30 mg/dL — AB
Specific Gravity, Urine: 1.027 (ref 1.005–1.030)
Urobilinogen, UA: 1 mg/dL (ref 0.0–1.0)
pH: 5 (ref 5.0–8.0)

## 2014-04-03 LAB — CBC WITH DIFFERENTIAL/PLATELET
Basophils Absolute: 0 10*3/uL (ref 0.0–0.1)
Basophils Relative: 0 % (ref 0–1)
Eosinophils Absolute: 0 10*3/uL (ref 0.0–0.7)
Eosinophils Relative: 0 % (ref 0–5)
HCT: 36.2 % (ref 36.0–46.0)
HEMOGLOBIN: 12.2 g/dL (ref 12.0–15.0)
LYMPHS ABS: 1.4 10*3/uL (ref 0.7–4.0)
LYMPHS PCT: 12 % (ref 12–46)
MCH: 30.1 pg (ref 26.0–34.0)
MCHC: 33.7 g/dL (ref 30.0–36.0)
MCV: 89.4 fL (ref 78.0–100.0)
MONOS PCT: 9 % (ref 3–12)
Monocytes Absolute: 1.1 10*3/uL — ABNORMAL HIGH (ref 0.1–1.0)
NEUTROS PCT: 79 % — AB (ref 43–77)
Neutro Abs: 9.8 10*3/uL — ABNORMAL HIGH (ref 1.7–7.7)
Platelets: 423 10*3/uL — ABNORMAL HIGH (ref 150–400)
RBC: 4.05 MIL/uL (ref 3.87–5.11)
RDW: 15.1 % (ref 11.5–15.5)
WBC: 12.3 10*3/uL — AB (ref 4.0–10.5)

## 2014-04-03 LAB — CBG MONITORING, ED: Glucose-Capillary: 87 mg/dL (ref 70–99)

## 2014-04-03 LAB — GLUCOSE, CAPILLARY
GLUCOSE-CAPILLARY: 111 mg/dL — AB (ref 70–99)
Glucose-Capillary: 80 mg/dL (ref 70–99)
Glucose-Capillary: 102 mg/dL — ABNORMAL HIGH (ref 70–99)

## 2014-04-03 LAB — LACTIC ACID, PLASMA: Lactic Acid, Venous: 1.5 mmol/L (ref 0.5–2.2)

## 2014-04-03 LAB — URINE MICROSCOPIC-ADD ON

## 2014-04-03 MED ORDER — HYDROCODONE-ACETAMINOPHEN 5-325 MG PO TABS
1.0000 | ORAL_TABLET | Freq: Four times a day (QID) | ORAL | Status: DC | PRN
  Administered 2014-04-04 – 2014-04-07 (×7): 1 via ORAL
  Filled 2014-04-03 (×7): qty 1

## 2014-04-03 MED ORDER — LORAZEPAM 2 MG/ML IJ SOLN
0.5000 mg | Freq: Once | INTRAMUSCULAR | Status: AC
  Administered 2014-04-03: 0.5 mg via INTRAVENOUS
  Filled 2014-04-03: qty 1

## 2014-04-03 MED ORDER — SODIUM CHLORIDE 0.9 % IJ SOLN
3.0000 mL | Freq: Two times a day (BID) | INTRAMUSCULAR | Status: DC
  Administered 2014-04-03 – 2014-04-06 (×6): 3 mL via INTRAVENOUS

## 2014-04-03 MED ORDER — PANTOPRAZOLE SODIUM 40 MG PO TBEC
40.0000 mg | DELAYED_RELEASE_TABLET | Freq: Every day | ORAL | Status: DC
  Administered 2014-04-03 – 2014-04-04 (×2): 40 mg via ORAL
  Filled 2014-04-03 (×2): qty 1

## 2014-04-03 MED ORDER — FENTANYL CITRATE 0.05 MG/ML IJ SOLN: 25.0000 ug | INTRAMUSCULAR | Status: DC | PRN

## 2014-04-03 MED ORDER — PIPERACILLIN-TAZOBACTAM 3.375 G IVPB
3.3750 g | Freq: Three times a day (TID) | INTRAVENOUS | Status: DC
  Administered 2014-04-03 – 2014-04-07 (×11): 3.375 g via INTRAVENOUS
  Filled 2014-04-03 (×15): qty 50

## 2014-04-03 MED ORDER — IOHEXOL 300 MG/ML  SOLN
100.0000 mL | Freq: Once | INTRAMUSCULAR | Status: AC | PRN
  Administered 2014-04-03: 100 mL via INTRAVENOUS

## 2014-04-03 MED ORDER — SODIUM CHLORIDE 0.9 % IV BOLUS (SEPSIS)
500.0000 mL | Freq: Once | INTRAVENOUS | Status: AC
  Administered 2014-04-03: 500 mL via INTRAVENOUS

## 2014-04-03 MED ORDER — VANCOMYCIN HCL IN DEXTROSE 1-5 GM/200ML-% IV SOLN: 1000.0000 mg | Freq: Once | INTRAVENOUS | Status: DC

## 2014-04-03 MED ORDER — METOPROLOL TARTRATE 12.5 MG HALF TABLET
12.5000 mg | ORAL_TABLET | Freq: Two times a day (BID) | ORAL | Status: DC
  Administered 2014-04-03 – 2014-04-07 (×8): 12.5 mg via ORAL
  Filled 2014-04-03 (×10): qty 1

## 2014-04-03 MED ORDER — IOHEXOL 300 MG/ML  SOLN
25.0000 mL | INTRAMUSCULAR | Status: AC
  Administered 2014-04-03: 25 mL via ORAL

## 2014-04-03 MED ORDER — INSULIN ASPART 100 UNIT/ML ~~LOC~~ SOLN
0.0000 [IU] | Freq: Three times a day (TID) | SUBCUTANEOUS | Status: DC
  Administered 2014-04-04 – 2014-04-05 (×2): 1 [IU] via SUBCUTANEOUS

## 2014-04-03 MED ORDER — HEPARIN SODIUM (PORCINE) 5000 UNIT/ML IJ SOLN
5000.0000 [IU] | Freq: Three times a day (TID) | INTRAMUSCULAR | Status: DC
  Administered 2014-04-03 – 2014-04-07 (×12): 5000 [IU] via SUBCUTANEOUS
  Filled 2014-04-03 (×12): qty 1

## 2014-04-03 MED ORDER — TRAMADOL HCL ER 100 MG PO TB24: 100.0000 mg | ORAL_TABLET | Freq: Every day | ORAL | Status: DC

## 2014-04-03 MED ORDER — ASPIRIN EC 325 MG PO TBEC
325.0000 mg | DELAYED_RELEASE_TABLET | Freq: Every day | ORAL | Status: DC
  Administered 2014-04-03 – 2014-04-05 (×3): 325 mg via ORAL
  Filled 2014-04-03 (×3): qty 1

## 2014-04-03 MED ORDER — ONDANSETRON HCL 4 MG/2ML IJ SOLN
4.0000 mg | Freq: Once | INTRAMUSCULAR | Status: DC
  Filled 2014-04-03: qty 2

## 2014-04-03 MED ORDER — LEFLUNOMIDE 20 MG PO TABS
20.0000 mg | ORAL_TABLET | Freq: Every day | ORAL | Status: DC
  Administered 2014-04-03 – 2014-04-07 (×4): 20 mg via ORAL
  Filled 2014-04-03 (×5): qty 1

## 2014-04-03 MED ORDER — SIMETHICONE 80 MG PO CHEW
80.0000 mg | CHEWABLE_TABLET | Freq: Two times a day (BID) | ORAL | Status: DC
  Administered 2014-04-03 – 2014-04-07 (×8): 80 mg via ORAL
  Filled 2014-04-03 (×10): qty 1

## 2014-04-03 MED ORDER — PREDNISONE 2.5 MG PO TABS
2.5000 mg | ORAL_TABLET | Freq: Three times a day (TID) | ORAL | Status: DC
  Administered 2014-04-03 – 2014-04-07 (×11): 2.5 mg via ORAL
  Filled 2014-04-03 (×15): qty 1

## 2014-04-03 MED ORDER — VANCOMYCIN HCL IN DEXTROSE 1-5 GM/200ML-% IV SOLN
1000.0000 mg | INTRAVENOUS | Status: DC
  Administered 2014-04-04 – 2014-04-06 (×3): 1000 mg via INTRAVENOUS
  Filled 2014-04-03 (×3): qty 200

## 2014-04-03 MED ORDER — SODIUM CHLORIDE 0.9 % IV SOLN
Freq: Once | INTRAVENOUS | Status: AC
  Administered 2014-04-03: 05:00:00 via INTRAVENOUS

## 2014-04-03 MED ORDER — PIPERACILLIN-TAZOBACTAM 3.375 G IVPB
3.3750 g | Freq: Once | INTRAVENOUS | Status: AC
  Administered 2014-04-03: 3.375 g via INTRAVENOUS
  Filled 2014-04-03: qty 50

## 2014-04-03 MED ORDER — TRAMADOL HCL 50 MG PO TABS
50.0000 mg | ORAL_TABLET | Freq: Two times a day (BID) | ORAL | Status: DC
  Administered 2014-04-03 – 2014-04-06 (×8): 50 mg via ORAL
  Filled 2014-04-03 (×8): qty 1

## 2014-04-03 MED ORDER — DOCUSATE SODIUM 100 MG PO CAPS
100.0000 mg | ORAL_CAPSULE | Freq: Two times a day (BID) | ORAL | Status: DC
  Administered 2014-04-03 – 2014-04-04 (×4): 100 mg via ORAL
  Filled 2014-04-03 (×6): qty 1

## 2014-04-03 NOTE — Progress Notes (Signed)
Patient seen and evaluated earlier this AM. Please refer to H and P for details regarding assessment and plan.  No source of infection identified.  Continue broad spectrum abx's.   Will reassess next AM  Abdulaziz Toman, [facility]  May require psychiatry consult for worsening delirium vs pseudodementia.

## 2014-04-03 NOTE — Progress Notes (Addendum)
Pt admitted to room 3e17, alert and oriented to self only.  VS wnl, placed on tele, incision on left hip oozing at the top.  Changed dressing and notified MD.  Daughter informed RN that [facility] operated on hip in March.

## 2014-04-03 NOTE — ED Notes (Signed)
Patient returned from CT

## 2014-04-03 NOTE — ED Notes (Signed)
Family at bedside. 

## 2014-04-03 NOTE — ED Notes (Signed)
Phlebotomy unable to obtain blood.

## 2014-04-03 NOTE — ED Notes (Signed)
MD notified unable to obtained blood cultures.  Antibiotics hung.

## 2014-04-03 NOTE — ED Notes (Signed)
Patient transported to CT 

## 2014-04-03 NOTE — Progress Notes (Signed)
VASCULAR LAB PRELIMINARY  PRELIMINARY  PRELIMINARY  PRELIMINARY  Bilateral lower extremity venous duplex  completed.    Preliminary report:  Bilateral:  No evidence of DVT, superficial thrombosis, or Baker's Cyst.    Aissatou Fronczak, RVT 04/03/2014, 3:42 PM

## 2014-04-03 NOTE — Progress Notes (Signed)
ANTIBIOTIC CONSULT NOTE - INITIAL  Pharmacy Consult for Vancomycin/Zosyn Indication: rule out sepsis  No Known Allergies  Patient Measurements: 60.5kg  Vital Signs: Temp: 101.3 F (38.5 C) (04/06 0209) Temp src: Rectal (04/06 0209) BP: 171/73 mmHg (04/06 0510) Pulse Rate: 111 (04/06 0510)  Labs:  Recent Labs  04/02/14 0030  WBC 12.3*  HGB 12.2  PLT 423*  CREATININE 0.87   Medical History: Past Medical History  Diagnosis Date  . Arthritis   . Gout   . Osteoarthritis   . Diabetes mellitus without complication   . Hypertension   . Dementia   . Cancer   . Anemia   . GERD (gastroesophageal reflux disease)    Assessment: 80 y/o F from [place] here with fall from wheelchair, found to have WBC 12.3, Scr 0.87, Tmax 101.3, other labs as above.   Goal of Therapy:  Vancomycin trough level 15-20 mcg/ml  Plan:  -Vancomycin 1000 mg IV q24h -Zosyn 3.375G IV q8h to be infused over 4 hours -Trend WBC, temp, renal function  -Drug levels as indicated  Narda Bonds 04/03/2014,5:37 AM

## 2014-04-03 NOTE — ED Notes (Signed)
POCT CBG resulted 87; Abigail Butts, RN notified

## 2014-04-03 NOTE — H&P (Signed)
[facility] History and Physical  Mauriana Fogleman [id number] DOB: 12/20/1923 DOA: 04/02/2014  Referring physician: EDP PCP: [name], MD   Chief Complaint: AMS   HPI: Michelle Yoder is a 80 y.o. female who presents to the ED with altered mental status.  Historian is daughter.  Patient comes in from [place] home after she "seems to have fallen out of her wheelchair".  She had a recent left hip fracture and repair and hasnt been doing very well since then and since the death of her husband recently per her daughter.  Yesterday at church however and today, she has had decreased PO intake, and been more lethargic.  She also has been irritable which she is not usually per daughter.  Work up in the ED was significant for fever of 101.3 and WBC of 12.3k.  Review of Systems: Difficult due to AMS. But patient denies: headache, neck pain or stiffness, abdominal pain, leg pain, pain anywhere else, shortness of breath, chest pain.  Past Medical History  Diagnosis Date  . Arthritis   . Gout   . Osteoarthritis   . Diabetes mellitus without complication   . Hypertension   . Dementia   . Cancer   . Anemia   . GERD (gastroesophageal reflux disease)    Past Surgical History  Procedure Laterality Date  . Mastectomy    . Abdominal hysterectomy    . Hip arthroplasty Left 03/09/2014    Procedure: ARTHROPLASTY BIPOLAR HIP;  Surgeon: [name], MD;  Location: [facility];  Service: Orthopedics;  Laterality: Left;   Social History:  reports that she has quit smoking. She does not have any smokeless tobacco history on file. She reports that she does not drink alcohol or use illicit drugs.  No Known Allergies  No family history on file.   Prior to Admission medications   Medication Sig Start Date End Date Taking? Authorizing Provider  aspirin EC 325 MG tablet Take 1 tablet (325 mg total) by mouth daily. For 1 month, then resume ASA 81mg  daily 03/13/14   Ripudeep Krystal Eaton, MD   B Complex Vitamins (VITAMIN B COMPLEX PO) Take 1 capsule by mouth daily.    Historical Provider, MD  Cholecalciferol (VITAMIN D-3) 1000 UNITS CAPS Take 1 capsule by mouth daily.    Historical Provider, MD  docusate sodium (COLACE) 100 MG capsule Take 1 capsule (100 mg total) by mouth 2 (two) times daily. 03/09/14   M. Doran Stabler, PA-C  HYDROcodone-acetaminophen [facility]) 5-325 MG per tablet Take one tablet by mouth every 6 hours as needed for moderate pain 03/14/14   Estill Dooms, MD  lansoprazole (PREVACID) 30 MG capsule Take 30 mg by mouth daily at 12 noon.    Historical Provider, MD  leflunomide (ARAVA) 20 MG tablet Take 20 mg by mouth daily.    Historical Provider, MD  LORazepam (ATIVAN) 0.5 MG tablet Take one tablet by mouth every 12 hours as needed 03/14/14   Estill Dooms, MD  metoprolol tartrate (LOPRESSOR) 12.5 mg TABS tablet Take 0.5 tablets (12.5 mg total) by mouth 2 (two) times daily. 03/13/14   Ripudeep Krystal Eaton, MD  OVER THE COUNTER MEDICATION Take 1 tablet by mouth daily. Medication name: Therapy M9mg -422mcg tablet    Historical Provider, MD  predniSONE (DELTASONE) 2.5 MG tablet Take 2.5 mg by mouth 3 (three) times daily. Take 2 tablets in the morning and 1 tablet at bed time.    Historical Provider, MD  simethicone (MI-ACID GAS RELIEF)  80 MG chewable tablet Chew 80 mg by mouth 2 (two) times daily.    Historical Provider, MD  sitaGLIPtin (JANUVIA) 50 MG tablet Take 50 mg by mouth daily.    Historical Provider, MD  traMADol (ULTRAM-ER) 100 MG 24 hr tablet Take one tablet by mouth once daily for pains 03/15/14   Ripudeep Krystal Eaton, MD   Physical Exam: Filed Vitals:   04/03/14 0510  BP: 171/73  Pulse: 111  Temp:   Resp: 16    BP 171/73  Pulse 111  Temp(Src) 101.3 F (38.5 C) (Rectal)  Resp 16  SpO2 94%  General Appearance:    Awake, not oriented, hard of hearing, no distress, appears stated age  Head:    Normocephalic, atraumatic  Eyes:    PERRL, EOMI, sclera non-icteric         Nose:   Nares without drainage or epistaxis. Mucosa, turbinates normal  Throat:   Moist mucous membranes. Oropharynx without erythema or exudate.  Neck:   Supple. No carotid bruits.  No thyromegaly.  No lymphadenopathy.   Back:     No CVA tenderness, no spinal tenderness  Lungs:     Clear to auscultation bilaterally, without wheezes, rhonchi or rales  Chest wall:    No tenderness to palpitation  Heart:    Regular rate and rhythm without murmurs, gallops, rubs  Abdomen:     Soft, non-tender, nondistended, normal bowel sounds, no organomegaly  Genitalia:    deferred  Rectal:    deferred  Extremities:   Hip incision C/D/I, mild seperation at superior aspect no purulence, no erythema.  Pulses:   2+ and symmetric all extremities  Skin:   Skin color, texture, turgor normal, no rashes or lesions  Lymph nodes:   Cervical, supraclavicular, and axillary nodes normal  Neurologic:   MAE, patient not really able to participate in neuro exam at this time.    Labs on Admission:  Basic Metabolic Panel:  Recent Labs Lab 04/02/14 0030  NA 139  K 4.0  CL 101  CO2 24  GLUCOSE 124*  BUN 18  CREATININE 0.87  CALCIUM 8.9   Liver Function Tests:  Recent Labs Lab 04/02/14 0030  AST 57*  ALT 39*  ALKPHOS 407*  BILITOT 0.6  PROT 6.3  ALBUMIN 2.0*   No results found for this basename: LIPASE, AMYLASE,  in the last 168 hours No results found for this basename: AMMONIA,  in the last 168 hours CBC:  Recent Labs Lab 04/02/14 0030  WBC 12.3*  NEUTROABS 9.8*  HGB 12.2  HCT 36.2  MCV 89.4  PLT 423*   Cardiac Enzymes: No results found for this basename: CKTOTAL, CKMB, CKMBINDEX, TROPONINI,  in the last 168 hours  BNP (last 3 results) No results found for this basename: PROBNP,  in the last 8760 hours CBG: No results found for this basename: GLUCAP,  in the last 168 hours  Radiological Exams on Admission: Ct Head Wo Contrast  04/02/2014   CLINICAL DATA:  Status post fall; concern for  head or cervical spine injury.  EXAM: CT HEAD WITHOUT CONTRAST  CT CERVICAL SPINE WITHOUT CONTRAST  TECHNIQUE: Multidetector CT imaging of the head and cervical spine was performed following the standard protocol without intravenous contrast. Multiplanar CT image reconstructions of the cervical spine were also generated.  COMPARISON:  None.  FINDINGS: CT HEAD FINDINGS  There is no evidence of acute infarction, mass lesion, or intra- or extra-axial hemorrhage on CT.  Prominence of the  ventricles and sulci reflects moderate cortical volume loss. Mild cerebellar atrophy is noted. Diffuse periventricular and subcortical white matter change likely reflects small vessel ischemic microangiopathy.  The brainstem and fourth ventricle are within normal limits. The basal ganglia are unremarkable in appearance. The cerebral hemispheres demonstrate grossly normal gray-white differentiation. No mass effect or midline shift is seen.  There is no evidence of fracture; visualized osseous structures are unremarkable in appearance. The visualized portions of the orbits are within normal limits. There is mild partial opacification of the mastoid air cells bilaterally. There is partial opacification of the left maxillary sinus. The remaining paranasal sinuses are well-aerated. Minimal soft tissue swelling is noted overlying the high left parietal calvarium.  CT CERVICAL SPINE FINDINGS  There is no evidence of fracture or subluxation. There is chronic osseous fusion at C4-C5 and at C6-C7, with associated anterior and posterior disc osteophyte complexes. Underlying mild facet disease is noted. There is associated slight chronic loss of height at T1. Prevertebral soft tissues are within normal limits.  The thyroid gland is unremarkable in appearance. The visualized lung apices are clear. Minimal calcification is seen at the carotid bifurcations bilaterally.  IMPRESSION: 1. No evidence of traumatic intracranial injury or fracture. 2. No  evidence of fracture or subluxation along the cervical spine. 3. Minimal soft tissue swelling overlying the high left parietal calvarium. 4. Moderate cortical volume loss and diffuse small vessel ischemic microangiopathy. 5. Chronic osseous fusion at C4-C5 and at C6-C7, with associated degenerative change. 6. Minimal calcification at the carotid bifurcations bilaterally.   Electronically Signed   By: Garald Balding M.D.   On: 04/02/2014 23:35   Ct Cervical Spine Wo Contrast  04/02/2014   CLINICAL DATA:  Status post fall; concern for head or cervical spine injury.  EXAM: CT HEAD WITHOUT CONTRAST  CT CERVICAL SPINE WITHOUT CONTRAST  TECHNIQUE: Multidetector CT imaging of the head and cervical spine was performed following the standard protocol without intravenous contrast. Multiplanar CT image reconstructions of the cervical spine were also generated.  COMPARISON:  None.  FINDINGS: CT HEAD FINDINGS  There is no evidence of acute infarction, mass lesion, or intra- or extra-axial hemorrhage on CT.  Prominence of the ventricles and sulci reflects moderate cortical volume loss. Mild cerebellar atrophy is noted. Diffuse periventricular and subcortical white matter change likely reflects small vessel ischemic microangiopathy.  The brainstem and fourth ventricle are within normal limits. The basal ganglia are unremarkable in appearance. The cerebral hemispheres demonstrate grossly normal gray-white differentiation. No mass effect or midline shift is seen.  There is no evidence of fracture; visualized osseous structures are unremarkable in appearance. The visualized portions of the orbits are within normal limits. There is mild partial opacification of the mastoid air cells bilaterally. There is partial opacification of the left maxillary sinus. The remaining paranasal sinuses are well-aerated. Minimal soft tissue swelling is noted overlying the high left parietal calvarium.  CT CERVICAL SPINE FINDINGS  There is no evidence  of fracture or subluxation. There is chronic osseous fusion at C4-C5 and at C6-C7, with associated anterior and posterior disc osteophyte complexes. Underlying mild facet disease is noted. There is associated slight chronic loss of height at T1. Prevertebral soft tissues are within normal limits.  The thyroid gland is unremarkable in appearance. The visualized lung apices are clear. Minimal calcification is seen at the carotid bifurcations bilaterally.  IMPRESSION: 1. No evidence of traumatic intracranial injury or fracture. 2. No evidence of fracture or subluxation along the cervical spine.  3. Minimal soft tissue swelling overlying the high left parietal calvarium. 4. Moderate cortical volume loss and diffuse small vessel ischemic microangiopathy. 5. Chronic osseous fusion at C4-C5 and at C6-C7, with associated degenerative change. 6. Minimal calcification at the carotid bifurcations bilaterally.   Electronically Signed   By: Roanna Raider M.D.   On: 04/02/2014 23:35   Ct Abdomen Pelvis W Contrast  04/03/2014   CLINICAL DATA:  Unwitnessed fall. Diffuse pain. Concern for abdominal injury.  EXAM: CT ABDOMEN AND PELVIS WITH CONTRAST  TECHNIQUE: Multidetector CT imaging of the abdomen and pelvis was performed using the standard protocol following bolus administration of intravenous contrast.  CONTRAST:  OMNIPAQUE IOHEXOL 300 MG/ML  SOLN  COMPARISON:  Pelvis radiograph performed 03/09/2014, and CT of the left hip performed 03/08/2014  FINDINGS: The visualized lung bases are clear.  No free air or free fluid is seen within the abdomen or pelvis. There is no evidence of solid or hollow organ injury.  The liver and spleen are unremarkable in appearance. Mildly increased attenuation layering dependently within the gallbladder likely reflects tiny stones. The gallbladder is otherwise unremarkable. The pancreas and adrenal glands are unremarkable.  Mild bilateral renal atrophy is noted. A few right renal cysts are  seen. There is no evidence of hydronephrosis. No renal or ureteral stones are seen. No perinephric stranding is appreciated.  The small bowel is unremarkable in appearance. The stomach is within normal limits. No acute vascular abnormalities are seen. Mild calcification is seen along the abdominal aorta and its branches.  The appendix is normal in caliber, without evidence for appendicitis. Scattered diverticulosis is noted along the sigmoid colon, without evidence of diverticulitis.  The bladder is mildly distended and grossly unremarkable. The patient is status post hysterectomy. No suspicious adnexal masses are seen. No inguinal lymphadenopathy is seen.  Focal fluid tracking within the soft tissues posterolateral to the left hip, measuring 3.6 cm, may reflect a small postoperative seroma.  No acute osseous abnormalities are identified. The left hip arthroplasty is incompletely imaged but appears grossly unremarkable. The lower thoracic spine is difficult to fully assess due to motion artifact.  IMPRESSION: 1. No evidence of traumatic injury to the abdomen or pelvis. 2. Likely small postoperative seroma in the soft tissues posterolateral to the left hip, measuring 3.6 cm. 3. Cholelithiasis noted; gallbladder otherwise unremarkable. 4. Mild bilateral renal atrophy noted; few right renal cysts seen. 5. Mild calcification along the abdominal aorta and its branches. 6. Scattered diverticulosis along the sigmoid colon, without evidence of diverticulitis.   Electronically Signed   By: Roanna Raider M.D.   On: 04/03/2014 04:17   Dg Chest Portable 1 View  04/03/2014   CLINICAL DATA:  Unwitnessed fall; concern for chest injury.  EXAM: PORTABLE CHEST - 1 VIEW  COMPARISON:  Chest radiograph performed 03/13/2014  FINDINGS: The lungs are well-aerated and clear. There is no evidence of focal opacification, pleural effusion or pneumothorax.  The cardiomediastinal silhouette is borderline normal in size. No acute osseous  abnormalities are seen.  IMPRESSION: No acute cardiopulmonary process seen. No displaced rib fractures identified.   Electronically Signed   By: Roanna Raider M.D.   On: 04/03/2014 02:34    EKG: Independently reviewed.  Assessment/Plan Principal Problem:   SIRS (systemic inflammatory response syndrome) Active Problems:   Diabetes mellitus without complication   Delirium due to medical condition with behavioral disturbance   1. SIRS - causing delirium, patient with fever of 101.3 and WBC 12.3.  So  far negative studies working up for the source include: CXR, UA, CT abd/pelvis.  No rash anywhere, has been having some hip and leg pain on that side, so will get venous duplex to rule out DVT given that she had hip fracture repair last month.  Incision does have some drainage at the very top of it but no obvious signs of infection.  Never the less if a source cannot be found this is one consideration as a possible site of infection.  Other possibilities causing her fever include gout which the patient does have a history of. 2. DM - low dose SSI AC/HS    Code Status: Full code  Family Communication: Daughter is at bedside Disposition Plan: Admit to inpatient   Time spent: 70 min  Sabina Beavers M. [facility] Pager [phone]  If 7AM-7PM, please contact the day team taking care of the patient [email or web address] Password [email or web address] 04/03/2014, 5:34 AM

## 2014-04-04 DIAGNOSIS — I4729 Other ventricular tachycardia: Secondary | ICD-10-CM

## 2014-04-04 DIAGNOSIS — I472 Ventricular tachycardia: Secondary | ICD-10-CM

## 2014-04-04 LAB — GLUCOSE, CAPILLARY
GLUCOSE-CAPILLARY: 128 mg/dL — AB (ref 70–99)
Glucose-Capillary: 84 mg/dL (ref 70–99)
Glucose-Capillary: 98 mg/dL (ref 70–99)

## 2014-04-04 MED ORDER — GLUCERNA SHAKE PO LIQD
237.0000 mL | Freq: Two times a day (BID) | ORAL | Status: DC
  Administered 2014-04-04 – 2014-04-07 (×4): 237 mL via ORAL

## 2014-04-04 MED ORDER — HALOPERIDOL LACTATE 5 MG/ML IJ SOLN
1.0000 mg | Freq: Once | INTRAMUSCULAR | Status: AC
  Administered 2014-04-04: 1 mg via INTRAVENOUS
  Filled 2014-04-04: qty 1

## 2014-04-04 MED ORDER — HALOPERIDOL LACTATE 5 MG/ML IJ SOLN
1.0000 mg | Freq: Three times a day (TID) | INTRAMUSCULAR | Status: DC | PRN
  Administered 2014-04-06: 1 mg via INTRAMUSCULAR
  Filled 2014-04-04 (×2): qty 1

## 2014-04-04 MED ORDER — HALOPERIDOL 1 MG PO TABS
1.0000 mg | ORAL_TABLET | Freq: Three times a day (TID) | ORAL | Status: DC | PRN
  Administered 2014-04-06: 1 mg via ORAL
  Filled 2014-04-04 (×2): qty 1

## 2014-04-04 NOTE — Progress Notes (Signed)
INITIAL NUTRITION ASSESSMENT  DOCUMENTATION CODES Per approved criteria  -Not Applicable   INTERVENTION: Provide Glucerna Shakes BID RD to continue to monitor  NUTRITION DIAGNOSIS: Predicted suboptimal energy intake related to AMS and dementia as evidenced by pt refusing meals.   Goal: Pt to meet >/= 90% of their estimated nutrition needs   Monitor:  PO intake, weight trend, labs, goals of care  Reason for Assessment: Malnutrition Screening Tool, score of 3 and Low Braden Score  80 y.o. female  Admitting Dx: SIRS (systemic inflammatory response syndrome)  ASSESSMENT: 80 y.o. female who presents to the ED with altered mental status. Historian is daughter. Patient comes in from [place] home after she "seems to have fallen out of her wheelchair". She had a recent left hip fracture and repair and hasn't been doing very well since then and since the death of her husband recently per her daughter. Yesterday at church however and today, she has had decreased PO intake, and been more lethargic. She also has been irritable which she is not usually per daughter.  Pt asleep at time of visit. No family present in pt room. Per [facility] report pt has had recent weight loss and poor po intake due to decreased appetite. No evidence of recent weight per weight history below. Per nursing notes pt refused breakfast today but ate 50-60% of 2 meals yesterday.   Height: Ht Readings from Last 1 Encounters:  04/03/14 5\' 4"  (1.626 m)    Weight: Wt Readings from Last 1 Encounters:  04/04/14 133 lb 6.4 oz (60.51 kg)    Ideal Body Weight: 120 lbs  % Ideal Body Weight: 111%  Wt Readings from Last 10 Encounters:  04/04/14 133 lb 6.4 oz (60.51 kg)  03/08/14 133 lb 6.1 oz (60.5 kg)  03/08/14 133 lb 6.1 oz (60.5 kg)    Usual Body Weight: unknown  % Usual Body Weight: NA  BMI:  Body mass index is 22.89 kg/(m^2).  Estimated Nutritional Needs: Kcal: 1450-1650 Protein: 70-80 grams Fluid:  1.4-1.6 L/day  Skin: oozing left hip incision  Diet Order: Carb Control  EDUCATION NEEDS: -No education needs identified at this time   Intake/Output Summary (Last 24 hours) at 04/04/14 1043 Last data filed at 04/04/14 0830  Gross per 24 hour  Intake    650 ml  Output      0 ml  Net    650 ml    Last BM: 4/6  Labs:   Recent Labs Lab 04/02/14 0030  NA 139  K 4.0  CL 101  CO2 24  BUN 18  CREATININE 0.87  CALCIUM 8.9  GLUCOSE 124*    CBG (last 3)   Recent Labs  04/03/14 1652 04/03/14 2057 04/04/14 0558  GLUCAP 102* 111* 84    Scheduled Meds: . aspirin EC  325 mg Oral Daily  . docusate sodium  100 mg Oral BID  . heparin  5,000 Units Subcutaneous 3 times per day  . insulin aspart  0-9 Units Subcutaneous TID WC  . leflunomide  20 mg Oral Daily  . metoprolol tartrate  12.5 mg Oral BID  . ondansetron  4 mg Intravenous Once  . pantoprazole  40 mg Oral Daily  . piperacillin-tazobactam (ZOSYN)  IV  3.375 g Intravenous 3 times per day  . predniSONE  2.5 mg Oral TID WC  . simethicone  80 mg Oral BID  . sodium chloride  3 mL Intravenous Q12H  . traMADol  50 mg Oral Q12H  .  vancomycin  1,000 mg Intravenous Once  . vancomycin  1,000 mg Intravenous Q24H    Continuous Infusions:   Past Medical History  Diagnosis Date  . Arthritis   . Gout   . Osteoarthritis   . Diabetes mellitus without complication   . Hypertension   . Dementia   . Cancer   . Anemia   . GERD (gastroesophageal reflux disease)     Past Surgical History  Procedure Laterality Date  . Mastectomy    . Abdominal hysterectomy    . Hip arthroplasty Left 03/09/2014    Procedure: ARTHROPLASTY BIPOLAR HIP;  Surgeon: [name], MD;  Location: [facility];  Service: Orthopedics;  Laterality: Left;    Pryor Ochoa RD, LDN Inpatient Clinical Dietitian Pager: [phone] After Hours Pager: [phone]

## 2014-04-04 NOTE — Progress Notes (Signed)
Pt still very combative and restless, MD notified order for Haldol 1 mg IV gotten, and given, pt had a run of 26 beats of VT while trying to get OOB.

## 2014-04-04 NOTE — Progress Notes (Signed)
[facility] PROGRESS NOTE  Michelle Yoder YNW:[id number] DOB: July 06, 1923 DOA: 04/02/2014 PCP: [name], MD Brief Narrative: Patient is an 80 year old Caucasian female who presented to the ED with complaints reported from family of altered mental status. Patient presented with leukocytosis and fever of 101.3. Fever of unknown origin and patient meeting Sirs criteria. Having drainage from the wound performed roughly a week ago for repair of left hip fracture. Orthopedic surgeon consulted.  Assessment/Plan:  Principal Problem:   SIRS (systemic inflammatory response syndrome) - Chest x-ray, UA, CT of abdomen and pelvis negative for source of infection - Consulted patient's orthopedic surgeon Edmonia Lynch for further evaluation as nursing is reported losing at left incision from left hip fracture repair.  - Obtain wound culture discussed with nursing - Patient was febrile yesterday. Place on broad spectrum IV antibiotics and currently is afebrile. We'll plan on continuing current antibiotic regimen.  Nonsustained V. tach overnight - Will obtain Echocardiogram to assess for LV function - Pt currently on B blocker - Patient has dementia and given current condition patient may not be candidate for aggressive therapy. - resolved, continue B blocker - could have been exacerbated due to fever and SIRs  Active Problems:   Diabetes mellitus without complication - diabetic diet - SSI - sensitive scale    Delirium due to medical condition with behavioral disturbance - Patient agitated overnight - Will add haldol prn agitation  Code Status: full Family Communication: none at bedside. Disposition Plan: Continue broad spectrum abx's, wound culture    Consultants:  Orthopaedic surgery: consulted 04/04/14  Procedures:  none  Antibiotics: Vancomycin and Zosyn since admission  HPI/Subjective: No new complaints currently.  Although last night patient reportedly was agitated and  was trying to get out of bed and had non sustained V tach. Currently in sinus rhythm. Nursing reports drainage at left hip incision site.  Objective: Filed Vitals:   04/04/14 0941  BP: 138/74  Pulse: 82  Temp:   Resp:     Intake/Output Summary (Last 24 hours) at 04/04/14 1005 Last data filed at 04/04/14 0830  Gross per 24 hour  Intake    650 ml  Output      0 ml  Net    650 ml   Filed Weights   04/03/14 0849 04/04/14 0531  Weight: 61.326 kg (135 lb 3.2 oz) 60.51 kg (133 lb 6.4 oz)    Exam:   General:  Pt in NAD, alert and awake  Cardiovascular: RRR, no MRG  Respiratory: CTA BL, no wheezes  Abdomen: soft, NT, ND  Musculoskeletal: left hip incision intact, with serosanguinous discharge at top of incision.   Data Reviewed: Basic Metabolic Panel:  Recent Labs Lab 04/02/14 0030  NA 139  K 4.0  CL 101  CO2 24  GLUCOSE 124*  BUN 18  CREATININE 0.87  CALCIUM 8.9   Liver Function Tests:  Recent Labs Lab 04/02/14 0030  AST 57*  ALT 39*  ALKPHOS 407*  BILITOT 0.6  PROT 6.3  ALBUMIN 2.0*   No results found for this basename: LIPASE, AMYLASE,  in the last 168 hours No results found for this basename: AMMONIA,  in the last 168 hours CBC:  Recent Labs Lab 04/02/14 0030  WBC 12.3*  NEUTROABS 9.8*  HGB 12.2  HCT 36.2  MCV 89.4  PLT 423*   Cardiac Enzymes: No results found for this basename: CKTOTAL, CKMB, CKMBINDEX, TROPONINI,  in the last 168 hours BNP (last 3 results) No results found for  this basename: PROBNP,  in the last 8760 hours CBG:  Recent Labs Lab 04/03/14 0824 04/03/14 1225 04/03/14 1652 04/03/14 2057 04/04/14 0558  GLUCAP 87 80 102* 111* 84    Recent Results (from the past 240 hour(s))  CULTURE, BLOOD (ROUTINE X 2)     Status: None   Collection Time    04/03/14  5:42 AM      Result Value Ref Range Status   Specimen Description BLOOD RIGHT UPPER ARM IV START   Final   Special Requests BOTTLES DRAWN AEROBIC AND ANAEROBIC  10CC EA   Final   Culture  Setup Time     Final   Value: 04/03/2014 08:40     Performed at [facility]   Culture     Final   Value:        BLOOD CULTURE RECEIVED NO GROWTH TO DATE CULTURE WILL BE HELD FOR 5 DAYS BEFORE ISSUING A FINAL NEGATIVE REPORT     Performed at [facility]   Report Status PENDING   Incomplete  CULTURE, BLOOD (ROUTINE X 2)     Status: None   Collection Time    04/03/14  8:10 AM      Result Value Ref Range Status   Specimen Description BLOOD RIGHT HAND   Final   Special Requests     Final   Value: BOTTLES DRAWN AEROBIC AND ANAEROBIC 10CC BLUE 5CC RED   Culture  Setup Time     Final   Value: 04/03/2014 12:26     Performed at [facility]   Culture     Final   Value:        BLOOD CULTURE RECEIVED NO GROWTH TO DATE CULTURE WILL BE HELD FOR 5 DAYS BEFORE ISSUING A FINAL NEGATIVE REPORT     Performed at [facility]   Report Status PENDING   Incomplete     Studies: Ct Head Wo Contrast  04/02/2014   CLINICAL DATA:  Status post fall; concern for head or cervical spine injury.  EXAM: CT HEAD WITHOUT CONTRAST  CT CERVICAL SPINE WITHOUT CONTRAST  TECHNIQUE: Multidetector CT imaging of the head and cervical spine was performed following the standard protocol without intravenous contrast. Multiplanar CT image reconstructions of the cervical spine were also generated.  COMPARISON:  None.  FINDINGS: CT HEAD FINDINGS  There is no evidence of acute infarction, mass lesion, or intra- or extra-axial hemorrhage on CT.  Prominence of the ventricles and sulci reflects moderate cortical volume loss. Mild cerebellar atrophy is noted. Diffuse periventricular and subcortical white matter change likely reflects small vessel ischemic microangiopathy.  The brainstem and fourth ventricle are within normal limits. The basal ganglia are unremarkable in appearance. The cerebral hemispheres demonstrate grossly normal gray-white differentiation. No mass effect or midline  shift is seen.  There is no evidence of fracture; visualized osseous structures are unremarkable in appearance. The visualized portions of the orbits are within normal limits. There is mild partial opacification of the mastoid air cells bilaterally. There is partial opacification of the left maxillary sinus. The remaining paranasal sinuses are well-aerated. Minimal soft tissue swelling is noted overlying the high left parietal calvarium.  CT CERVICAL SPINE FINDINGS  There is no evidence of fracture or subluxation. There is chronic osseous fusion at C4-C5 and at C6-C7, with associated anterior and posterior disc osteophyte complexes. Underlying mild facet disease is noted. There is associated slight chronic loss of height at T1. Prevertebral soft tissues are within  normal limits.  The thyroid gland is unremarkable in appearance. The visualized lung apices are clear. Minimal calcification is seen at the carotid bifurcations bilaterally.  IMPRESSION: 1. No evidence of traumatic intracranial injury or fracture. 2. No evidence of fracture or subluxation along the cervical spine. 3. Minimal soft tissue swelling overlying the high left parietal calvarium. 4. Moderate cortical volume loss and diffuse small vessel ischemic microangiopathy. 5. Chronic osseous fusion at C4-C5 and at C6-C7, with associated degenerative change. 6. Minimal calcification at the carotid bifurcations bilaterally.   Electronically Signed   By: Garald Balding M.D.   On: 04/02/2014 23:35   Ct Cervical Spine Wo Contrast  04/02/2014   CLINICAL DATA:  Status post fall; concern for head or cervical spine injury.  EXAM: CT HEAD WITHOUT CONTRAST  CT CERVICAL SPINE WITHOUT CONTRAST  TECHNIQUE: Multidetector CT imaging of the head and cervical spine was performed following the standard protocol without intravenous contrast. Multiplanar CT image reconstructions of the cervical spine were also generated.  COMPARISON:  None.  FINDINGS: CT HEAD FINDINGS  There  is no evidence of acute infarction, mass lesion, or intra- or extra-axial hemorrhage on CT.  Prominence of the ventricles and sulci reflects moderate cortical volume loss. Mild cerebellar atrophy is noted. Diffuse periventricular and subcortical white matter change likely reflects small vessel ischemic microangiopathy.  The brainstem and fourth ventricle are within normal limits. The basal ganglia are unremarkable in appearance. The cerebral hemispheres demonstrate grossly normal gray-white differentiation. No mass effect or midline shift is seen.  There is no evidence of fracture; visualized osseous structures are unremarkable in appearance. The visualized portions of the orbits are within normal limits. There is mild partial opacification of the mastoid air cells bilaterally. There is partial opacification of the left maxillary sinus. The remaining paranasal sinuses are well-aerated. Minimal soft tissue swelling is noted overlying the high left parietal calvarium.  CT CERVICAL SPINE FINDINGS  There is no evidence of fracture or subluxation. There is chronic osseous fusion at C4-C5 and at C6-C7, with associated anterior and posterior disc osteophyte complexes. Underlying mild facet disease is noted. There is associated slight chronic loss of height at T1. Prevertebral soft tissues are within normal limits.  The thyroid gland is unremarkable in appearance. The visualized lung apices are clear. Minimal calcification is seen at the carotid bifurcations bilaterally.  IMPRESSION: 1. No evidence of traumatic intracranial injury or fracture. 2. No evidence of fracture or subluxation along the cervical spine. 3. Minimal soft tissue swelling overlying the high left parietal calvarium. 4. Moderate cortical volume loss and diffuse small vessel ischemic microangiopathy. 5. Chronic osseous fusion at C4-C5 and at C6-C7, with associated degenerative change. 6. Minimal calcification at the carotid bifurcations bilaterally.    Electronically Signed   By: Garald Balding M.D.   On: 04/02/2014 23:35   Ct Abdomen Pelvis W Contrast  04/03/2014   CLINICAL DATA:  Unwitnessed fall. Diffuse pain. Concern for abdominal injury.  EXAM: CT ABDOMEN AND PELVIS WITH CONTRAST  TECHNIQUE: Multidetector CT imaging of the abdomen and pelvis was performed using the standard protocol following bolus administration of intravenous contrast.  CONTRAST:  163mL OMNIPAQUE IOHEXOL 300 MG/ML  SOLN  COMPARISON:  Pelvis radiograph performed 03/09/2014, and CT of the left hip performed 03/08/2014  FINDINGS: The visualized lung bases are clear.  No free air or free fluid is seen within the abdomen or pelvis. There is no evidence of solid or hollow organ injury.  The liver and spleen are  unremarkable in appearance. Mildly increased attenuation layering dependently within the gallbladder likely reflects tiny stones. The gallbladder is otherwise unremarkable. The pancreas and adrenal glands are unremarkable.  Mild bilateral renal atrophy is noted. A few right renal cysts are seen. There is no evidence of hydronephrosis. No renal or ureteral stones are seen. No perinephric stranding is appreciated.  The small bowel is unremarkable in appearance. The stomach is within normal limits. No acute vascular abnormalities are seen. Mild calcification is seen along the abdominal aorta and its branches.  The appendix is normal in caliber, without evidence for appendicitis. Scattered diverticulosis is noted along the sigmoid colon, without evidence of diverticulitis.  The bladder is mildly distended and grossly unremarkable. The patient is status post hysterectomy. No suspicious adnexal masses are seen. No inguinal lymphadenopathy is seen.  Focal fluid tracking within the soft tissues posterolateral to the left hip, measuring 3.6 cm, may reflect a small postoperative seroma.  No acute osseous abnormalities are identified. The left hip arthroplasty is incompletely imaged but appears  grossly unremarkable. The lower thoracic spine is difficult to fully assess due to motion artifact.  IMPRESSION: 1. No evidence of traumatic injury to the abdomen or pelvis. 2. Likely small postoperative seroma in the soft tissues posterolateral to the left hip, measuring 3.6 cm. 3. Cholelithiasis noted; gallbladder otherwise unremarkable. 4. Mild bilateral renal atrophy noted; few right renal cysts seen. 5. Mild calcification along the abdominal aorta and its branches. 6. Scattered diverticulosis along the sigmoid colon, without evidence of diverticulitis.   Electronically Signed   By: Garald Balding M.D.   On: 04/03/2014 04:17   Dg Chest Portable 1 View  04/03/2014   CLINICAL DATA:  Unwitnessed fall; concern for chest injury.  EXAM: PORTABLE CHEST - 1 VIEW  COMPARISON:  Chest radiograph performed 03/13/2014  FINDINGS: The lungs are well-aerated and clear. There is no evidence of focal opacification, pleural effusion or pneumothorax.  The cardiomediastinal silhouette is borderline normal in size. No acute osseous abnormalities are seen.  IMPRESSION: No acute cardiopulmonary process seen. No displaced rib fractures identified.   Electronically Signed   By: Garald Balding M.D.   On: 04/03/2014 02:34    Scheduled Meds: . aspirin EC  325 mg Oral Daily  . docusate sodium  100 mg Oral BID  . heparin  5,000 Units Subcutaneous 3 times per day  . insulin aspart  0-9 Units Subcutaneous TID WC  . leflunomide  20 mg Oral Daily  . metoprolol tartrate  12.5 mg Oral BID  . ondansetron  4 mg Intravenous Once  . pantoprazole  40 mg Oral Daily  . piperacillin-tazobactam (ZOSYN)  IV  3.375 g Intravenous 3 times per day  . predniSONE  2.5 mg Oral TID WC  . simethicone  80 mg Oral BID  . sodium chloride  3 mL Intravenous Q12H  . traMADol  50 mg Oral Q12H  . vancomycin  1,000 mg Intravenous Once  . vancomycin  1,000 mg Intravenous Q24H   Continuous Infusions:    Time spent: > 35 minutes    Velvet Bathe  [facility] Pager [phone]. If 7PM-7AM, please contact night-coverage at [email or web address], password [facility] 04/04/2014, 10:05 AM  LOS: 2 days

## 2014-04-04 NOTE — Care Management Note (Signed)
    Page 1 of 1   04/04/2014     11:23:09 AM   CARE MANAGEMENT NOTE 04/04/2014  Patient:  Michelle Yoder, Michelle Yoder   Account Number:  [id number]  Date Initiated:  04/04/2014  Documentation initiated by:  Fuller Mandril  Subjective/Objective Assessment:   80 year old Caucasian female who presented to the ED with complaints reported from family of altered mental status.//From [facility]     Action/Plan:   Broad spectrum IV antibiotics//Return to SNF   Anticipated DC Date:  04/06/2014   Anticipated DC Plan:  SKILLED NURSING FACILITY  In-house referral  Clinical Social Worker      DC Planning Services  CM consult      Choice offered to / List presented to:             Status of service:  In process, will continue to follow Medicare Important Message given?   (If response is "NO", the following Medicare IM given date fields will be blank) Date Medicare IM given:   Date Additional Medicare IM given:    Discharge Disposition:    Per UR Regulation:  Reviewed for med. necessity/level of care/duration of stay  If discussed at [place] of Stay Meetings, dates discussed:    Comments:  04/04/14 [place], RN, BSN, [place] [phone] Pt last admission 3/10-3/18 for hip fractrue.  D/C'd to [facility].

## 2014-04-04 NOTE — Progress Notes (Signed)
Pt still very combative even after medication for anxiety given, pt pull dressing on her Left arm and make a big skin tear on her arm, abrasion cleaned and mepelex dressing applied.

## 2014-04-04 NOTE — Progress Notes (Signed)
Pt very restless and confuse, Ativan 0.5 mg IV given, but pt still pulling IV and telemetry out. Pt on bed alarm for fall precaution. We'll continue with POC.

## 2014-04-04 NOTE — Progress Notes (Signed)
Pt's daughter wanted to talk with Dr. Wendee Beavers for update.  MD paged and spoke with daughter.  Daughter very thankful.  Will continue to monitor.  Karie Kirks, Therapist, sports.

## 2014-04-04 NOTE — Consult Note (Signed)
ORTHOPAEDIC CONSULTATION  REQUESTING PHYSICIAN: Velvet Bathe, MD  Chief Complaint: fever and AMS. S/p L hip hemiarthroplasty on 03/09/14  HPI: Michelle Yoder is a 80 y.o. female who complains of  Some pain at the left groin. She is mildly demented and does not report any new injuries  Past Medical History  Diagnosis Date  . Arthritis   . Gout   . Osteoarthritis   . Diabetes mellitus without complication   . Hypertension   . Dementia   . Cancer   . Anemia   . GERD (gastroesophageal reflux disease)    Past Surgical History  Procedure Laterality Date  . Mastectomy    . Abdominal hysterectomy    . Hip arthroplasty Left 03/09/2014    Procedure: ARTHROPLASTY BIPOLAR HIP;  Surgeon: [name], MD;  Location: [facility];  Service: Orthopedics;  Laterality: Left;   History   Social History  . Marital Status: Widowed    Spouse Name: N/A    Number of Children: N/A  . Years of Education: N/A   Social History Main Topics  . Smoking status: Former Research scientist (life sciences)  . Smokeless tobacco: None  . Alcohol Use: No  . Drug Use: No  . Sexual Activity: None   Other Topics Concern  . None   Social History Narrative  . None   No family history on file. No Known Allergies Prior to Admission medications   Medication Sig Start Date End Date Taking? Authorizing Provider  acetaminophen (TYLENOL) 325 MG tablet Take 650 mg by mouth 4 (four) times daily.   Yes Historical Provider, MD  aspirin EC 325 MG tablet Take 1 tablet (325 mg total) by mouth daily. For 1 month, then resume ASA 64m daily 03/13/14  Yes Ripudeep KKrystal Eaton MD  aspirin EC 81 MG tablet Take 81 mg by mouth daily.   Yes Historical Provider, MD  B Complex Vitamins (VITAMIN B COMPLEX PO) Take 1 capsule by mouth daily.   Yes Historical Provider, MD  Cholecalciferol (VITAMIN D-3) 1000 UNITS CAPS Take 1 capsule by mouth daily.   Yes Historical Provider, MD  docusate sodium (COLACE) 100 MG capsule Take 1 capsule (100 mg total) by mouth 2  (two) times daily. 03/09/14  Yes M. LDoran Stabler PA-C  lansoprazole (PREVACID) 30 MG capsule Take 30 mg by mouth daily at 12 noon.   Yes Historical Provider, MD  leflunomide (ARAVA) 20 MG tablet Take 20 mg by mouth daily.   Yes Historical Provider, MD  LORazepam (ATIVAN) 0.5 MG tablet Take one tablet by mouth every 12 hours as needed 03/14/14  Yes AEstill Dooms MD  metoprolol tartrate (LOPRESSOR) 12.5 mg TABS tablet Take 0.5 tablets (12.5 mg total) by mouth 2 (two) times daily. 03/13/14  Yes Ripudeep KKrystal Eaton MD  mirtazapine (REMERON) 15 MG tablet Take 7.5 mg by mouth at bedtime. Take 1/2 tablet (7.551m by mouth at bedtime for appetite stimulant   Yes Historical Provider, MD  Multiple Vitamins-Minerals (THERADEX M PO) Take 1 tablet by mouth daily.   Yes Historical Provider, MD  OVER THE COUNTER MEDICATION Take 1 tablet by mouth daily. Medication name: Therapy [id number] tablet   Yes Historical Provider, MD  predniSONE (DELTASONE) 2.5 MG tablet Take 2.5-5 mg by mouth 2 (two) times daily. Give 2 tablets (5 mg) in the morning and 1 tablet (2.5 mg) in the evening   Yes Historical Provider, MD  simethicone (MI-ACID GAS RELIEF) 80 MG chewable tablet Chew 80 mg by mouth  2 (two) times daily.   Yes Historical Provider, MD  sitaGLIPtin (JANUVIA) 50 MG tablet Take 50 mg by mouth daily.   Yes Historical Provider, MD  traMADol (ULTRAM) 50 MG tablet Take 50 mg by mouth every 6 (six) hours as needed for moderate pain.   Yes Historical Provider, MD  HYDROcodone-acetaminophen (NORCO) 5-325 MG per tablet Take one tablet by mouth every 6 hours as needed for moderate pain 03/14/14   Estill Dooms, MD   Ct Head Wo Contrast  04/02/2014   CLINICAL DATA:  Status post fall; concern for head or cervical spine injury.  EXAM: CT HEAD WITHOUT CONTRAST  CT CERVICAL SPINE WITHOUT CONTRAST  TECHNIQUE: Multidetector CT imaging of the head and cervical spine was performed following the standard protocol without intravenous contrast.  Multiplanar CT image reconstructions of the cervical spine were also generated.  COMPARISON:  None.  FINDINGS: CT HEAD FINDINGS  There is no evidence of acute infarction, mass lesion, or intra- or extra-axial hemorrhage on CT.  Prominence of the ventricles and sulci reflects moderate cortical volume loss. Mild cerebellar atrophy is noted. Diffuse periventricular and subcortical white matter change likely reflects small vessel ischemic microangiopathy.  The brainstem and fourth ventricle are within normal limits. The basal ganglia are unremarkable in appearance. The cerebral hemispheres demonstrate grossly normal gray-white differentiation. No mass effect or midline shift is seen.  There is no evidence of fracture; visualized osseous structures are unremarkable in appearance. The visualized portions of the orbits are within normal limits. There is mild partial opacification of the mastoid air cells bilaterally. There is partial opacification of the left maxillary sinus. The remaining paranasal sinuses are well-aerated. Minimal soft tissue swelling is noted overlying the high left parietal calvarium.  CT CERVICAL SPINE FINDINGS  There is no evidence of fracture or subluxation. There is chronic osseous fusion at C4-C5 and at C6-C7, with associated anterior and posterior disc osteophyte complexes. Underlying mild facet disease is noted. There is associated slight chronic loss of height at T1. Prevertebral soft tissues are within normal limits.  The thyroid gland is unremarkable in appearance. The visualized lung apices are clear. Minimal calcification is seen at the carotid bifurcations bilaterally.  IMPRESSION: 1. No evidence of traumatic intracranial injury or fracture. 2. No evidence of fracture or subluxation along the cervical spine. 3. Minimal soft tissue swelling overlying the high left parietal calvarium. 4. Moderate cortical volume loss and diffuse small vessel ischemic microangiopathy. 5. Chronic osseous  fusion at C4-C5 and at C6-C7, with associated degenerative change. 6. Minimal calcification at the carotid bifurcations bilaterally.   Electronically Signed   By: Garald Balding M.D.   On: 04/02/2014 23:35   Ct Cervical Spine Wo Contrast  04/02/2014   CLINICAL DATA:  Status post fall; concern for head or cervical spine injury.  EXAM: CT HEAD WITHOUT CONTRAST  CT CERVICAL SPINE WITHOUT CONTRAST  TECHNIQUE: Multidetector CT imaging of the head and cervical spine was performed following the standard protocol without intravenous contrast. Multiplanar CT image reconstructions of the cervical spine were also generated.  COMPARISON:  None.  FINDINGS: CT HEAD FINDINGS  There is no evidence of acute infarction, mass lesion, or intra- or extra-axial hemorrhage on CT.  Prominence of the ventricles and sulci reflects moderate cortical volume loss. Mild cerebellar atrophy is noted. Diffuse periventricular and subcortical white matter change likely reflects small vessel ischemic microangiopathy.  The brainstem and fourth ventricle are within normal limits. The basal ganglia are unremarkable in appearance. The  cerebral hemispheres demonstrate grossly normal gray-white differentiation. No mass effect or midline shift is seen.  There is no evidence of fracture; visualized osseous structures are unremarkable in appearance. The visualized portions of the orbits are within normal limits. There is mild partial opacification of the mastoid air cells bilaterally. There is partial opacification of the left maxillary sinus. The remaining paranasal sinuses are well-aerated. Minimal soft tissue swelling is noted overlying the high left parietal calvarium.  CT CERVICAL SPINE FINDINGS  There is no evidence of fracture or subluxation. There is chronic osseous fusion at C4-C5 and at C6-C7, with associated anterior and posterior disc osteophyte complexes. Underlying mild facet disease is noted. There is associated slight chronic loss of height  at T1. Prevertebral soft tissues are within normal limits.  The thyroid gland is unremarkable in appearance. The visualized lung apices are clear. Minimal calcification is seen at the carotid bifurcations bilaterally.  IMPRESSION: 1. No evidence of traumatic intracranial injury or fracture. 2. No evidence of fracture or subluxation along the cervical spine. 3. Minimal soft tissue swelling overlying the high left parietal calvarium. 4. Moderate cortical volume loss and diffuse small vessel ischemic microangiopathy. 5. Chronic osseous fusion at C4-C5 and at C6-C7, with associated degenerative change. 6. Minimal calcification at the carotid bifurcations bilaterally.   Electronically Signed   By: Garald Balding M.D.   On: 04/02/2014 23:35   Ct Abdomen Pelvis W Contrast  04/03/2014   CLINICAL DATA:  Unwitnessed fall. Diffuse pain. Concern for abdominal injury.  EXAM: CT ABDOMEN AND PELVIS WITH CONTRAST  TECHNIQUE: Multidetector CT imaging of the abdomen and pelvis was performed using the standard protocol following bolus administration of intravenous contrast.  CONTRAST:  135m OMNIPAQUE IOHEXOL 300 MG/ML  SOLN  COMPARISON:  Pelvis radiograph performed 03/09/2014, and CT of the left hip performed 03/08/2014  FINDINGS: The visualized lung bases are clear.  No free air or free fluid is seen within the abdomen or pelvis. There is no evidence of solid or hollow organ injury.  The liver and spleen are unremarkable in appearance. Mildly increased attenuation layering dependently within the gallbladder likely reflects tiny stones. The gallbladder is otherwise unremarkable. The pancreas and adrenal glands are unremarkable.  Mild bilateral renal atrophy is noted. A few right renal cysts are seen. There is no evidence of hydronephrosis. No renal or ureteral stones are seen. No perinephric stranding is appreciated.  The small bowel is unremarkable in appearance. The stomach is within normal limits. No acute vascular abnormalities  are seen. Mild calcification is seen along the abdominal aorta and its branches.  The appendix is normal in caliber, without evidence for appendicitis. Scattered diverticulosis is noted along the sigmoid colon, without evidence of diverticulitis.  The bladder is mildly distended and grossly unremarkable. The patient is status post hysterectomy. No suspicious adnexal masses are seen. No inguinal lymphadenopathy is seen.  Focal fluid tracking within the soft tissues posterolateral to the left hip, measuring 3.6 cm, may reflect a small postoperative seroma.  No acute osseous abnormalities are identified. The left hip arthroplasty is incompletely imaged but appears grossly unremarkable. The lower thoracic spine is difficult to fully assess due to motion artifact.  IMPRESSION: 1. No evidence of traumatic injury to the abdomen or pelvis. 2. Likely small postoperative seroma in the soft tissues posterolateral to the left hip, measuring 3.6 cm. 3. Cholelithiasis noted; gallbladder otherwise unremarkable. 4. Mild bilateral renal atrophy noted; few right renal cysts seen. 5. Mild calcification along the abdominal aorta and  its branches. 6. Scattered diverticulosis along the sigmoid colon, without evidence of diverticulitis.   Electronically Signed   By: Garald Balding M.D.   On: 04/03/2014 04:17   Dg Chest Portable 1 View  04/03/2014   CLINICAL DATA:  Unwitnessed fall; concern for chest injury.  EXAM: PORTABLE CHEST - 1 VIEW  COMPARISON:  Chest radiograph performed 03/13/2014  FINDINGS: The lungs are well-aerated and clear. There is no evidence of focal opacification, pleural effusion or pneumothorax.  The cardiomediastinal silhouette is borderline normal in size. No acute osseous abnormalities are seen.  IMPRESSION: No acute cardiopulmonary process seen. No displaced rib fractures identified.   Electronically Signed   By: Garald Balding M.D.   On: 04/03/2014 02:34    Positive ROS: All other systems have been reviewed  and were otherwise negative with the exception of those mentioned in the HPI and as above.  Labs cbc  Recent Labs  04/02/14 0030  WBC 12.3*  HGB 12.2  HCT 36.2  PLT 423*    Labs inflam No results found for this basename: ESR, CRP,  in the last 72 hours  Labs coag No results found for this basename: INR, PT, PTT,  in the last 72 hours   Recent Labs  04/02/14 0030  NA 139  K 4.0  CL 101  CO2 24  GLUCOSE 124*  BUN 18  CREATININE 0.87  CALCIUM 8.9    Physical Exam: Filed Vitals:   04/04/14 1330  BP: 121/53  Pulse: 74  Temp: 98.6 F (37 C)  Resp: 18   General: Alert, no acute distress Cardiovascular: No pedal edema Respiratory: No cyanosis, no use of accessory musculature GI: No organomegaly, abdomen is soft and non-tender Skin: No lesions in the area of chief complaint Neurologic: Sensation intact distally Psychiatric: Patient is competent for consent with normal mood and affect Lymphatic: No axillary or cervical lymphadenopathy  MUSCULOSKELETAL:  LLE: At the proximal incision there is a 12m portion that is draining serous fluid, no surrounding erythem and no TTP. She has no pain with log roll of the leg. Distally she is able to wiggle her toes and has a good pulse at her DP  She is not oriented to place or time.   Other extremities are atraumatic with painless ROM and NVI.  Assessment: S/p L hip hemi with some drainage at her proximal incision  Plan: The drainage is worrisome but the incision does not appear infected at this time. I will continue to follow her clinical exam and wound cultures at this time, should it not improve or worsen then it may require operative I&D but will hold off on this to allow it to declare itself at this time.  Weight Bearing Status: WBAT LLE Agree with IV abx but may consider broader coverage at this time.  PT VTE px: SCD's and chemical px per the primary team.   MRenette Butters MD Cell ([phone]  04/04/2014 4:31 PM

## 2014-04-05 DIAGNOSIS — S72009A Fracture of unspecified part of neck of unspecified femur, initial encounter for closed fracture: Secondary | ICD-10-CM

## 2014-04-05 DIAGNOSIS — I10 Essential (primary) hypertension: Secondary | ICD-10-CM

## 2014-04-05 LAB — GLUCOSE, CAPILLARY
GLUCOSE-CAPILLARY: 93 mg/dL (ref 70–99)
GLUCOSE-CAPILLARY: 112 mg/dL — AB (ref 70–99)
GLUCOSE-CAPILLARY: 132 mg/dL — AB (ref 70–99)
Glucose-Capillary: 105 mg/dL — ABNORMAL HIGH (ref 70–99)
Glucose-Capillary: 140 mg/dL — ABNORMAL HIGH (ref 70–99)

## 2014-04-05 LAB — BASIC METABOLIC PANEL
BUN: 16 mg/dL (ref 6–23)
CHLORIDE: 107 meq/L (ref 96–112)
CO2: 23 meq/L (ref 19–32)
CREATININE: 0.92 mg/dL (ref 0.50–1.10)
Calcium: 8.5 mg/dL (ref 8.4–10.5)
GFR calc non Af Amer: 54 mL/min — ABNORMAL LOW (ref >90)
GFR, EST AFRICAN AMERICAN: 63 mL/min — AB (ref >90)
Glucose, Bld: 110 mg/dL — ABNORMAL HIGH (ref 70–99)
Potassium: 4.5 mEq/L (ref 3.7–5.3)
Sodium: 141 mEq/L (ref 137–147)

## 2014-04-05 LAB — CBC
HCT: 27.6 % — ABNORMAL LOW (ref 36.0–46.0)
Hemoglobin: 9 g/dL — ABNORMAL LOW (ref 12.0–15.0)
MCH: 29.5 pg (ref 26.0–34.0)
MCHC: 32.6 g/dL (ref 30.0–36.0)
MCV: 90.5 fL (ref 78.0–100.0)
Platelets: 375 10*3/uL (ref 150–400)
RBC: 3.05 MIL/uL — AB (ref 3.87–5.11)
RDW: 15.4 % (ref 11.5–15.5)
WBC: 9.3 10*3/uL (ref 4.0–10.5)

## 2014-04-05 MED ORDER — ASPIRIN 325 MG PO TABS
325.0000 mg | ORAL_TABLET | Freq: Every day | ORAL | Status: DC
  Administered 2014-04-07: 325 mg via ORAL
  Filled 2014-04-05 (×2): qty 1

## 2014-04-05 MED ORDER — PANTOPRAZOLE SODIUM 40 MG PO PACK
40.0000 mg | PACK | Freq: Every day | ORAL | Status: DC
  Administered 2014-04-05 – 2014-04-07 (×2): 40 mg via ORAL
  Filled 2014-04-05 (×3): qty 20

## 2014-04-05 MED ORDER — DOCUSATE SODIUM 50 MG/5ML PO LIQD
100.0000 mg | Freq: Two times a day (BID) | ORAL | Status: DC
  Administered 2014-04-05 – 2014-04-07 (×4): 100 mg via ORAL
  Filled 2014-04-05 (×6): qty 10

## 2014-04-05 NOTE — Progress Notes (Signed)
[facility] PROGRESS NOTE  Michelle Yoder DGU:[id number] DOB: Jul 02, 1923 DOA: 04/02/2014 PCP: [name], MD Brief Narrative: Patient is an 80 year old Caucasian female who presented to the ED with complaints reported from family of altered mental status. Patient presented with leukocytosis and fever of 101.3. Fever of unknown origin and patient meeting Sirs criteria. Having drainage from the wound performed roughly a week ago for repair of left hip fracture. Orthopedic surgeon consulted.  Assessment/Plan:   SIRS (systemic inflammatory response syndrome) - Chest x-ray, UA, CT of abdomen and pelvis negative for source of infection -continue broad spectrum antibiotics and follow wound culture -will follow ortho recommendations -currently no further episodes of fever and WBC's now WNL    Nonsustained V. tach overnight - no further episodes appreciated; episode happened while patient was agitated; ?? artifacts - Pt currently on B blocker - Patient has dementia and given current condition patient may not be candidate for aggressive therapy or anticoagulation. - resolved, continue B blocker - could have been exacerbated due to fever and SIRs    Diabetes mellitus without complication - diabetic diet - continue SSI - sensitive scale    Delirium due to medical condition with behavioral disturbance - continue PRN haldol   Physical deconditioning -PT ordered -plan is for SNF at discharge  ZDG:LOVFIEP  Code Status: full Family Communication: none at bedside. Disposition Plan: Continue broad spectrum abx's, follow wound culture; SNF at discharge   Consultants:  Orthopaedic surgery: consulted 04/04/14  Procedures:  none  Antibiotics: Vancomycin and Zosyn since admission  HPI/Subjective: No further episodes of agitation; patient complaining of left hip pain. Still with serosanguineous drainage out of top incision.  Objective: Filed Vitals:   04/05/14 1422  BP: 121/67   Pulse: 68  Temp: 98.5 F (36.9 C)  Resp: 20    Intake/Output Summary (Last 24 hours) at 04/05/14 1749 Last data filed at 04/05/14 1422  Gross per 24 hour  Intake    778 ml  Output      0 ml  Net    778 ml   Filed Weights   04/03/14 0849 04/04/14 0531 04/05/14 0542  Weight: 61.326 kg (135 lb 3.2 oz) 60.51 kg (133 lb 6.4 oz) 60.1 kg (132 lb 7.9 oz)    Exam:   General:  Pt in NAD, alert and awake. Oriented X 1; complaining of left hip pain  Cardiovascular: RRR, no MRG  Respiratory: CTA BL, no wheezes  Abdomen: soft, NT, ND  Musculoskeletal: left hip incision intact, with serosanguinous discharge at top of incision; per records and nursing documentation is clearer and less abundant.   Data Reviewed: Basic Metabolic Panel:  Recent Labs Lab 04/02/14 0030 04/05/14 0509  NA 139 141  K 4.0 4.5  CL 101 107  CO2 24 23  GLUCOSE 124* 110*  BUN 18 16  CREATININE 0.87 0.92  CALCIUM 8.9 8.5   Liver Function Tests:  Recent Labs Lab 04/02/14 0030  AST 57*  ALT 39*  ALKPHOS 407*  BILITOT 0.6  PROT 6.3  ALBUMIN 2.0*   CBC:  Recent Labs Lab 04/02/14 0030 04/05/14 0509  WBC 12.3* 9.3  NEUTROABS 9.8*  --   HGB 12.2 9.0*  HCT 36.2 27.6*  MCV 89.4 90.5  PLT 423* 375   CBG:  Recent Labs Lab 04/04/14 1629 04/05/14 0537 04/05/14 0557 04/05/14 1102 04/05/14 1608  GLUCAP 128* 93 105* 112* 132*    Recent Results (from the past 240 hour(s))  CULTURE, BLOOD (ROUTINE X 2)  Status: None   Collection Time    04/03/14  5:42 AM      Result Value Ref Range Status   Specimen Description BLOOD RIGHT UPPER ARM IV START   Final   Special Requests BOTTLES DRAWN AEROBIC AND ANAEROBIC 10CC EA   Final   Culture  Setup Time     Final   Value: 04/03/2014 08:40     Performed at [facility]   Culture     Final   Value:        BLOOD CULTURE RECEIVED NO GROWTH TO DATE CULTURE WILL BE HELD FOR 5 DAYS BEFORE ISSUING A FINAL NEGATIVE REPORT     Performed at  [facility]   Report Status PENDING   Incomplete  CULTURE, BLOOD (ROUTINE X 2)     Status: None   Collection Time    04/03/14  8:10 AM      Result Value Ref Range Status   Specimen Description BLOOD RIGHT HAND   Final   Special Requests     Final   Value: BOTTLES DRAWN AEROBIC AND ANAEROBIC 10CC BLUE 5CC RED   Culture  Setup Time     Final   Value: 04/03/2014 12:26     Performed at [facility]   Culture     Final   Value:        BLOOD CULTURE RECEIVED NO GROWTH TO DATE CULTURE WILL BE HELD FOR 5 DAYS BEFORE ISSUING A FINAL NEGATIVE REPORT     Performed at [facility]   Report Status PENDING   Incomplete  WOUND CULTURE     Status: None   Collection Time    04/04/14 12:26 PM      Result Value Ref Range Status   Specimen Description WOUND HIP LEFT   Final   Special Requests Normal   Final   Gram Stain     Final   Value: MODERATE WBC PRESENT, PREDOMINANTLY PMN     RARE SQUAMOUS EPITHELIAL CELLS PRESENT     NO ORGANISMS SEEN     Performed at [facility]   Culture     Final   Value: Culture reincubated for better growth     Performed at [facility]   Report Status PENDING   Incomplete     Studies: No results found.  Scheduled Meds: . [START ON 04/06/2014] aspirin  325 mg Oral Daily  . docusate  100 mg Oral BID  . feeding supplement (GLUCERNA SHAKE)  237 mL Oral BID BM  . heparin  5,000 Units Subcutaneous 3 times per day  . insulin aspart  0-9 Units Subcutaneous TID WC  . leflunomide  20 mg Oral Daily  . metoprolol tartrate  12.5 mg Oral BID  . ondansetron  4 mg Intravenous Once  . pantoprazole sodium  40 mg Oral Daily  . piperacillin-tazobactam (ZOSYN)  IV  3.375 g Intravenous 3 times per day  . predniSONE  2.5 mg Oral TID WC  . simethicone  80 mg Oral BID  . sodium chloride  3 mL Intravenous Q12H  . traMADol  50 mg Oral Q12H  . vancomycin  1,000 mg Intravenous Once  . vancomycin  1,000 mg Intravenous Q24H   Continuous  Infusions:    Time spent: > 30 minutes    Barton Dubois  [facility] Pager [phone]. If 7PM-7AM, please contact night-coverage at [email or web address], password [facility] 04/05/2014, 5:49 PM  LOS: 3 days

## 2014-04-06 DIAGNOSIS — F0390 Unspecified dementia without behavioral disturbance: Secondary | ICD-10-CM

## 2014-04-06 DIAGNOSIS — M069 Rheumatoid arthritis, unspecified: Secondary | ICD-10-CM

## 2014-04-06 DIAGNOSIS — IMO0002 Reserved for concepts with insufficient information to code with codable children: Secondary | ICD-10-CM

## 2014-04-06 LAB — GLUCOSE, CAPILLARY
GLUCOSE-CAPILLARY: 68 mg/dL — AB (ref 70–99)
Glucose-Capillary: 101 mg/dL — ABNORMAL HIGH (ref 70–99)
Glucose-Capillary: 84 mg/dL (ref 70–99)
Glucose-Capillary: 94 mg/dL (ref 70–99)

## 2014-04-06 LAB — VANCOMYCIN, TROUGH: Vancomycin Tr: 10.8 ug/mL (ref 10.0–20.0)

## 2014-04-06 MED ORDER — CIPROFLOXACIN HCL 500 MG PO TABS: 500.0000 mg | ORAL_TABLET | Freq: Two times a day (BID) | ORAL | Status: AC

## 2014-04-06 MED ORDER — GLUCERNA SHAKE PO LIQD: 237.0000 mL | Freq: Two times a day (BID) | ORAL | Status: AC

## 2014-04-06 MED ORDER — ASPIRIN EC 325 MG PO TBEC: 325.0000 mg | DELAYED_RELEASE_TABLET | Freq: Every day | ORAL | Status: AC

## 2014-04-06 MED ORDER — SITAGLIPTIN PHOSPHATE 25 MG PO TABS: 25.0000 mg | ORAL_TABLET | Freq: Every day | ORAL | Status: DC

## 2014-04-06 MED ORDER — MAGNESIUM OXIDE 400 (241.3 MG) MG PO TABS: 400.0000 mg | ORAL_TABLET | Freq: Every day | ORAL | Status: AC

## 2014-04-06 MED ORDER — LORAZEPAM 0.5 MG PO TABS: ORAL_TABLET | ORAL | Status: DC

## 2014-04-06 MED ORDER — ASPIRIN EC 81 MG PO TBEC: 81.0000 mg | DELAYED_RELEASE_TABLET | Freq: Every day | ORAL | Status: DC

## 2014-04-06 MED ORDER — HALOPERIDOL 1 MG PO TABS: 1.0000 mg | ORAL_TABLET | Freq: Three times a day (TID) | ORAL | Status: DC | PRN

## 2014-04-06 MED ORDER — RISPERIDONE 0.25 MG PO TABS: 0.2500 mg | ORAL_TABLET | Freq: Every day | ORAL | Status: DC

## 2014-04-06 MED ORDER — HYDROCODONE-ACETAMINOPHEN 5-325 MG PO TABS: 1.0000 | ORAL_TABLET | Freq: Four times a day (QID) | ORAL | Status: DC | PRN

## 2014-04-06 NOTE — Progress Notes (Signed)
I participated in the care of this patient and agree with the above history, physical and evaluation. I performed a review of the history and a physical exam as detailed   Jenniah Bhavsar Daniel Amado Andal MD  

## 2014-04-06 NOTE — Progress Notes (Signed)
Patient ID: Michelle Yoder, female   DOB: May 25, 1923, 80 y.o.   MRN: [id number]     Subjective:  Patient sleeping and is very hard to arouse does wake and follow some commands.  Patient is unaware of time and place  Objective:   VITALS:   Filed Vitals:   04/04/14 2106 04/05/14 0542 04/05/14 1422 04/05/14 2049  BP: 126/56 118/67 121/67 139/67  Pulse: 86 84 68 93  Temp: 98 F (36.7 C) 98.2 F (36.8 C) 98.5 F (36.9 C) 97.5 F (36.4 C)  TempSrc: Oral Oral Oral Oral  Resp: 19 20 20 20   Height:      Weight:  60.1 kg (132 lb 7.9 oz)    SpO2: 96% 95% 94% 95%    ABD soft Sensation intact distally Dorsiflexion/Plantar flexion intact Incision: dressing C/D/I and scant drainage No sign of infection.   Lab Results  Component Value Date   WBC 9.3 04/05/2014   HGB 9.0* 04/05/2014   HCT 27.6* 04/05/2014   MCV 90.5 04/05/2014   PLT 375 04/05/2014     Assessment/Plan:     Principal Problem:   SIRS (systemic inflammatory response syndrome) Active Problems:   Diabetes mellitus without complication   Delirium due to medical condition with behavioral disturbance   Non-sustained ventricular tachycardia   Advance diet Up with therapy Continue plan per medicine WBAT Dry dressing PRN   Joya Gaskins 04/06/2014, 7:07 AM   Edmonia Lynch MD [phone]

## 2014-04-06 NOTE — Evaluation (Signed)
Physical Therapy Evaluation Patient Details Name: Michelle Yoder MRN: [id number] DOB: 03/04/1923 Today's Date: 04/06/2014   History of Present Illness   Michelle Yoder is a 80 y.o. female who presents to the ED with altered mental status. Pt suffered fall at [facility] from wheelchair; s/p Lt THA due to femoral neck fx.   Clinical Impression  Pt is s/p Lt THA resulting in the deficits listed below (see PT Problem List). Pt will benefit from skilled PT to increase their independence and safety with mobility to allow discharge to the venue listed below. Pt limited in evaluation due to decreased cognition and lethargy. No family present during session. Pt will require total (A) upon return to [facility].      Follow Up Recommendations SNF    Equipment Recommendations  None recommended by PT    Recommendations for Other Services OT consult     Precautions / Restrictions Precautions Precautions: Fall Restrictions Weight Bearing Restrictions: Yes LLE Weight Bearing: Weight bearing as tolerated      Mobility  Bed Mobility Overal bed mobility: Needs Assistance;+2 for physical assistance Bed Mobility: Supine to Sit;Sit to Supine     Supine to sit: +2 for physical assistance;Total assist Sit to supine: +2 for physical assistance;Total assist   General bed mobility comments: use of helicopter technique and draw pad to facilitate pt sitting EOB; c/o pain with movement of Lt LE and Lt UE; 2 person (A) required; unable to tolerate 5 min of sitting EOB due to fatigue   Transfers                 General transfer comment: unable to assess today due to pain and lethargy   Ambulation/Gait                Stairs            Wheelchair Mobility    Modified Rankin (Stroke Patients Only)       Balance Overall balance assessment: Needs assistance;History of Falls Sitting-balance support: Feet supported;Single extremity supported Sitting balance-Leahy Scale:  Poor Sitting balance - Comments: required min (A) to maintain upright posture sitting EOB; unable to tolerated 5 min due to fatigue and pain Postural control: Left lateral lean;Posterior lean                                   Pertinent Vitals/Pain C/o pain with movement of Lt UE and LE; unable to rate pain.     Home Living Family/patient expects to be discharged to:: Skilled nursing facility                 Additional Comments: Pt from [facility]    Prior Function Level of Independence: Needs assistance   Gait / Transfers Assistance Needed: no family present     Comments: no family present; at this time assume total care; pt reports she was ambulating as tolerated.      Hand Dominance        Extremity/Trunk Assessment   Upper Extremity Assessment: Defer to OT evaluation (c/o Lt UE pain with light touch and PROM )           Lower Extremity Assessment: Generalized weakness;LLE deficits/detail;Difficult to assess due to impaired cognition   LLE Deficits / Details: unable to fully assess Lt LE secondary to pain and cognition   Cervical / Trunk Assessment: Kyphotic  Communication   Communication: No difficulties  Cognition Arousal/Alertness:  Lethargic;Suspect due to medications Behavior During Therapy: Restless;Agitated Overall Cognitive Status: No family/caregiver present to determine baseline cognitive functioning       Memory: Decreased short-term memory              General Comments General comments (skin integrity, edema, etc.): decreased ability to follow commands during session;  pt lethargic and confused throughout session; no family present    Exercises        Assessment/Plan    PT Assessment Patient needs continued PT services  PT Diagnosis Difficulty walking;Generalized weakness;Acute pain;Altered mental status   PT Problem List Decreased strength;Decreased activity tolerance;Decreased balance;Decreased  mobility;Decreased coordination;Decreased cognition;Decreased knowledge of use of DME;Decreased safety awareness;Pain;Decreased range of motion;Decreased knowledge of precautions  PT Treatment Interventions DME instruction;Gait training;Functional mobility training;Therapeutic activities;Therapeutic exercise;Balance training;Patient/family education;Cognitive remediation;Neuromuscular re-education   PT Goals (Current goals can be found in the Care Plan section) Acute Rehab PT Goals Patient Stated Goal: None stated PT Goal Formulation: Patient unable to participate in goal setting Time For Goal Achievement: 04/20/14 Potential to Achieve Goals: Fair    Frequency Min 3X/week   Barriers to discharge        Co-evaluation               End of Session Equipment Utilized During Treatment: Other (comment) (UE restraints ) Activity Tolerance: Patient limited by pain;Patient limited by lethargy (decreased cognition ) Patient left: in bed;with call bell/phone within reach;with bed alarm set;with restraints reapplied Nurse Communication: Mobility status         Time: 1000-1016 PT Time Calculation (min): 16 min   Charges:   PT Evaluation $Initial PT Evaluation Tier I: 1 Procedure PT Treatments $Therapeutic Activity: 8-22 mins   PT G CodesKennis Carina [place], [place] [phone] 04/06/2014, 10:43 AM

## 2014-04-06 NOTE — Progress Notes (Signed)
Per Dr. Dyann Kief- patient is medically stable for d/c.  CSW has been trying to reach patient's daughter [name] throughout the day and was finally able to reach her this afternoon. She has been touring facilities today and has chosen International aid/development worker in [place]. CSW spoke with Dorene Sorrow- Admissions Director at [facility]- she will be able to admit patient tomorrow.  Patient's daughter became very distraught when approached about trying to d/c today- stating that she would be unable to adequately prepare for d/c and had an appointment this afternoon.  She is agreeable to d/c in the morning. Dr. Dyann Kief and patient's nurse notified of above.  Lorie Phenix. [place], [place]

## 2014-04-06 NOTE — Discharge Summary (Addendum)
Physician Discharge Summary  Michelle Yoder [id number] DOB: 1923/06/14 DOA: 04/02/2014  PCP: Michelle Grant, MD  Admit date: 04/02/2014 Discharge date: 04/07/2014  Time spent: >30 minutes  Recommendations for Outpatient Follow-up:  1. BMET to follow electrolytes and renal function 2. Reassess CBG's and decide if patient will benefit of further hypoglycemic regimen; current A1C 5.4   Discharge Diagnoses:  Principal Problem:   SIRS (systemic inflammatory response syndrome) Active Problems:   Diabetes mellitus without complication   Delirium due to medical condition with behavioral disturbance   Non-sustained ventricular tachycardia Dementia with behavioral disturbances   Discharge Condition: stable and improved. Will discharge to SNF for further care, treatment and physical rehabilitation.  Diet recommendation: low sodium and low carb diet  Filed Weights   04/03/14 0849 04/04/14 0531 04/05/14 0542  Weight: 61.326 kg (135 lb 3.2 oz) 60.51 kg (133 lb 6.4 oz) 60.1 kg (132 lb 7.9 oz)    History of present illness:  80 y.o. female who presents to the ED with altered mental status. Historian is daughter. Patient comes in from [facility] home after she "seems to have fallen out of her wheelchair". She had a recent left hip fracture and repair and hasnt been doing very well since then and since the death of her husband recently per her daughter. Yesterday at church however and today, she has had decreased PO intake, and been more lethargic. She also has been irritable which she is not usually per daughter.  Work up in the ED was significant for fever of 101.3 and WBC of 12.3k.   Hospital Course:  SIRS (systemic inflammatory response syndrome)  - Chest x-ray, UA, CT of abdomen and pelvis negative for source of infection. Left hip wound with positive pseudomonas aeruginosa. -patient received 4 days of IV broad spectrum antibiotics, with resolution of SIRs process. -will discharge  now on cipro 500mg  BID and will complete 10 more days. -at discharge no further episodes of fever and WBC's now WNL  -follow up with orthopedic service as an outpatient for further evaluation and recommendations.  Nonsustained V. tach overnight  - no further episodes appreciated; episode happened while patient was experiencing agitation; ?? artifacts  - Pt currently on B blocker  - resolved, continue B blocker  - could have been exacerbated due to fever and SIRs as well.  Diabetes mellitus without complication  - continue diabetic diet  - continue low dose januvia -last A1C 5.4 (might not need any hypoglycemic regimen); PCP to reassess in outpatient setting for final decision.  Ddementia with acute delirium due to medical condition -continue PRN haldol -continue risperdal -minimize narcotics   Physical deconditioning  -PT ordered  -plan is for SNF at discharge  Recent left hip fracture (s/p surgical repair); now with pseudomonas wound infection -follow ASA as indicated by orthopedic service for DVT prophylaxis -dry dressing changes PRN for wound -weight bearing as tolerated -follow up in 2 weeks -will be on cipro for wound infection. -no erythema or abscess appreciated.  Anxiety: continue PRN ativan.  *Rest of medical problems remains stable and the plan is to continue current medication regimen.  Procedures:  See below for x-ray reports   Consultations:  Orthopedic service   Discharge Exam: Filed Vitals:   04/06/14 1032  BP: 138/60  Pulse: 97  Temp: 98.1 F (36.7 C)  Resp: 18   General: Pt in NAD, alert and awake. Oriented X 1; complaining of left hip pain, able to follow commands Cardiovascular: RRR, no MRG  Respiratory: CTA BL, no wheezes  Abdomen: soft, NT, ND  Musculoskeletal: left hip incision intact, with serosanguinous discharge at top of incision; per records and nursing documentation is clearer and less abundant than on admission.   Discharge  Instructions You were cared for by a hospitalist during your hospital stay. If you have any questions about your discharge medications or the care you received while you were in the hospital after you are discharged, you can call the unit and asked to speak with the hospitalist on call if the hospitalist that took care of you is not available. Once you are discharged, your primary care physician will handle any further medical issues. Please note that NO REFILLS for any discharge medications will be authorized once you are discharged, as it is imperative that you return to your primary care physician (or establish a relationship with a primary care physician if you do not have one) for your aftercare needs so that they can reassess your need for medications and monitor your lab values.  Discharge Orders   Future Appointments Provider Department Dept Phone   07/10/2014 9:30 AM Rowe Clack, MD [place] [phone]   Future Orders Complete By Expires   Diet - low sodium heart healthy  As directed    Discharge instructions  As directed        Medication List    STOP taking these medications       OVER THE COUNTER MEDICATION     traMADol 50 MG tablet  Commonly known as:  ULTRAM      TAKE these medications       acetaminophen 325 MG tablet  Commonly known as:  TYLENOL  Take 650 mg by mouth 4 (four) times daily.     aspirin EC 325 MG tablet  Take 1 tablet (325 mg total) by mouth daily. For 1 month, then resume ASA 81mg  daily     aspirin EC 81 MG tablet  Take 1 tablet (81 mg total) by mouth daily.  Start taking on:  04/14/2014     ciprofloxacin 500 MG tablet  Commonly known as:  CIPRO  Take 1 tablet (500 mg total) by mouth 2 (two) times daily. Treatment for 10 days.     docusate sodium 100 MG capsule  Commonly known as:  COLACE  Take 1 capsule (100 mg total) by mouth 2 (two) times daily.     feeding supplement (GLUCERNA SHAKE) Liqd  Take 237 mLs  by mouth 2 (two) times daily between meals.     haloperidol 1 MG tablet  Commonly known as:  HALDOL  Take 1 tablet (1 mg total) by mouth every 8 (eight) hours as needed for agitation.     HYDROcodone-acetaminophen 5-325 MG per tablet  Commonly known as:  NORCO  Take 1 tablet by mouth every 6 (six) hours as needed for moderate pain.     lansoprazole 30 MG capsule  Commonly known as:  PREVACID  Take 30 mg by mouth daily at 12 noon.     leflunomide 20 MG tablet  Commonly known as:  ARAVA  Take 20 mg by mouth daily.     LORazepam 0.5 MG tablet  Commonly known as:  ATIVAN  Take one tablet by mouth every 12 hours as needed     magnesium oxide 400 (241.3 MG) MG tablet  Commonly known as:  MAGOX 400  Take 1 tablet (400 mg total) by mouth daily.     metoprolol tartrate  12.5 mg Tabs tablet  Commonly known as:  LOPRESSOR  Take 0.5 tablets (12.5 mg total) by mouth 2 (two) times daily.     MI-ACID GAS RELIEF 80 MG chewable tablet  Generic drug:  simethicone  Chew 80 mg by mouth 2 (two) times daily.     mirtazapine 15 MG tablet  Commonly known as:  REMERON  Take 7.5 mg by mouth at bedtime. Take 1/2 tablet (7.5mg ) by mouth at bedtime for appetite stimulant     predniSONE 2.5 MG tablet  Commonly known as:  DELTASONE  Take 2.5-5 mg by mouth 2 (two) times daily. Give 2 tablets (5 mg) in the morning and 1 tablet (2.5 mg) in the evening     risperiDONE 0.25 MG tablet  Commonly known as:  RISPERDAL  Take 1 tablet (0.25 mg total) by mouth at bedtime.     sitaGLIPtin 25 MG tablet  Commonly known as:  JANUVIA  Take 1 tablet (25 mg total) by mouth daily.     THERADEX M PO  Take 1 tablet by mouth daily.     VITAMIN B COMPLEX PO  Take 1 capsule by mouth daily.     Vitamin D-3 1000 UNITS Caps  Take 1 capsule by mouth daily.       No Known Allergies     Follow-up Information   Follow up with MURPHY, TIMOTHY, D, MD In 2 weeks.   Specialty:  Orthopedic Surgery   Contact  information:   [address] [place] [address] [phone]       Follow up with Michelle Grant, MD. Schedule an appointment as soon as possible for a visit in 10 days.   Specialty:  Internal Medicine   Contact information:   [address] [place] [address] [phone]       The results of significant diagnostics from this hospitalization (including imaging, microbiology, ancillary and laboratory) are listed below for reference.    Significant Diagnostic Studies: Dg Lumbar Spine Complete  03/08/2014   CLINICAL DATA Low back pain, no known injury.  EXAM LUMBAR SPINE - COMPLETE 4+ VIEW  COMPARISON None.  FINDINGS Multilevel degenerative changes with osteophyte formation and disc height loss. Diffuse osteopenia. There is mild superior endplate height loss at L5. Maintained vertebral body alignment. Atherosclerotic vascular calcifications. Lower lumbar/lumbosacral facet arthropathy.  IMPRESSION Multilevel degenerative changes. Mild superior endplate height loss at L5 is age indeterminate. Correlate for point tenderness.  SIGNATURE  Electronically Signed   By:  Levering M.D.   On: 03/08/2014 01:34   Dg Hip Complete Left  03/08/2014   CLINICAL DATA Left hip pain.  EXAM LEFT HIP - COMPLETE 2+ VIEW  COMPARISON None.  FINDINGS Severe deformity of left femoral head is noted consistent with avascular necrosis. Subsequent degenerative joint disease of the left hip is noted. There is noted some lucency in the region of the left femoral neck which may represent fracture.  IMPRESSION Severe deformity of left femoral head is noted consistent with avascular necrosis and subsequent degenerative joint disease of the left hip. Curvilinear lucency is seen below the left femoral head which may suggest fracture ; CT scan of the hip is recommended for further evaluation.  SIGNATURE  Electronically Signed   By: Sabino Dick M.D.   On: 03/08/2014 01:30    Ct Head Wo Contrast  04/02/2014   CLINICAL DATA:  Status post fall; concern for head or cervical spine injury.  EXAM: CT HEAD WITHOUT CONTRAST  CT CERVICAL SPINE WITHOUT CONTRAST  TECHNIQUE: Multidetector CT imaging of the head and cervical spine was performed following the standard protocol without intravenous contrast. Multiplanar CT image reconstructions of the cervical spine were also generated.  COMPARISON:  None.  FINDINGS: CT HEAD FINDINGS  There is no evidence of acute infarction, mass lesion, or intra- or extra-axial hemorrhage on CT.  Prominence of the ventricles and sulci reflects moderate cortical volume loss. Mild cerebellar atrophy is noted. Diffuse periventricular and subcortical white matter change likely reflects small vessel ischemic microangiopathy.  The brainstem and fourth ventricle are within normal limits. The basal ganglia are unremarkable in appearance. The cerebral hemispheres demonstrate grossly normal gray-white differentiation. No mass effect or midline shift is seen.  There is no evidence of fracture; visualized osseous structures are unremarkable in appearance. The visualized portions of the orbits are within normal limits. There is mild partial opacification of the mastoid air cells bilaterally. There is partial opacification of the left maxillary sinus. The remaining paranasal sinuses are well-aerated. Minimal soft tissue swelling is noted overlying the high left parietal calvarium.  CT CERVICAL SPINE FINDINGS  There is no evidence of fracture or subluxation. There is chronic osseous fusion at C4-C5 and at C6-C7, with associated anterior and posterior disc osteophyte complexes. Underlying mild facet disease is noted. There is associated slight chronic loss of height at T1. Prevertebral soft tissues are within normal limits.  The thyroid gland is unremarkable in appearance. The visualized lung apices are clear. Minimal calcification is seen at the carotid bifurcations bilaterally.   IMPRESSION: 1. No evidence of traumatic intracranial injury or fracture. 2. No evidence of fracture or subluxation along the cervical spine. 3. Minimal soft tissue swelling overlying the high left parietal calvarium. 4. Moderate cortical volume loss and diffuse small vessel ischemic microangiopathy. 5. Chronic osseous fusion at C4-C5 and at C6-C7, with associated degenerative change. 6. Minimal calcification at the carotid bifurcations bilaterally.   Electronically Signed   By: Garald Balding M.D.   On: 04/02/2014 23:35   Ct Cervical Spine Wo Contrast  04/02/2014   CLINICAL DATA:  Status post fall; concern for head or cervical spine injury.  EXAM: CT HEAD WITHOUT CONTRAST  CT CERVICAL SPINE WITHOUT CONTRAST  TECHNIQUE: Multidetector CT imaging of the head and cervical spine was performed following the standard protocol without intravenous contrast. Multiplanar CT image reconstructions of the cervical spine were also generated.  COMPARISON:  None.  FINDINGS: CT HEAD FINDINGS  There is no evidence of acute infarction, mass lesion, or intra- or extra-axial hemorrhage on CT.  Prominence of the ventricles and sulci reflects moderate cortical volume loss. Mild cerebellar atrophy is noted. Diffuse periventricular and subcortical white matter change likely reflects small vessel ischemic microangiopathy.  The brainstem and fourth ventricle are within normal limits. The basal ganglia are unremarkable in appearance. The cerebral hemispheres demonstrate grossly normal gray-white differentiation. No mass effect or midline shift is seen.  There is no evidence of fracture; visualized osseous structures are unremarkable in appearance. The visualized portions of the orbits are within normal limits. There is mild partial opacification of the mastoid air cells bilaterally. There is partial opacification of the left maxillary sinus. The remaining paranasal sinuses are well-aerated. Minimal soft tissue swelling is noted overlying the  high left parietal calvarium.  CT CERVICAL SPINE FINDINGS  There is no evidence of fracture or subluxation. There is chronic osseous fusion at C4-C5 and at C6-C7, with associated anterior and  posterior disc osteophyte complexes. Underlying mild facet disease is noted. There is associated slight chronic loss of height at T1. Prevertebral soft tissues are within normal limits.  The thyroid gland is unremarkable in appearance. The visualized lung apices are clear. Minimal calcification is seen at the carotid bifurcations bilaterally.  IMPRESSION: 1. No evidence of traumatic intracranial injury or fracture. 2. No evidence of fracture or subluxation along the cervical spine. 3. Minimal soft tissue swelling overlying the high left parietal calvarium. 4. Moderate cortical volume loss and diffuse small vessel ischemic microangiopathy. 5. Chronic osseous fusion at C4-C5 and at C6-C7, with associated degenerative change. 6. Minimal calcification at the carotid bifurcations bilaterally.   Electronically Signed   By: Garald Balding M.D.   On: 04/02/2014 23:35   Ct Abdomen Pelvis W Contrast  04/03/2014   CLINICAL DATA:  Unwitnessed fall. Diffuse pain. Concern for abdominal injury.  EXAM: CT ABDOMEN AND PELVIS WITH CONTRAST  TECHNIQUE: Multidetector CT imaging of the abdomen and pelvis was performed using the standard protocol following bolus administration of intravenous contrast.  CONTRAST:  19mL OMNIPAQUE IOHEXOL 300 MG/ML  SOLN  COMPARISON:  Pelvis radiograph performed 03/09/2014, and CT of the left hip performed 03/08/2014  FINDINGS: The visualized lung bases are clear.  No free air or free fluid is seen within the abdomen or pelvis. There is no evidence of solid or hollow organ injury.  The liver and spleen are unremarkable in appearance. Mildly increased attenuation layering dependently within the gallbladder likely reflects tiny stones. The gallbladder is otherwise unremarkable. The pancreas and adrenal glands are  unremarkable.  Mild bilateral renal atrophy is noted. A few right renal cysts are seen. There is no evidence of hydronephrosis. No renal or ureteral stones are seen. No perinephric stranding is appreciated.  The small bowel is unremarkable in appearance. The stomach is within normal limits. No acute vascular abnormalities are seen. Mild calcification is seen along the abdominal aorta and its branches.  The appendix is normal in caliber, without evidence for appendicitis. Scattered diverticulosis is noted along the sigmoid colon, without evidence of diverticulitis.  The bladder is mildly distended and grossly unremarkable. The patient is status post hysterectomy. No suspicious adnexal masses are seen. No inguinal lymphadenopathy is seen.  Focal fluid tracking within the soft tissues posterolateral to the left hip, measuring 3.6 cm, may reflect a small postoperative seroma.  No acute osseous abnormalities are identified. The left hip arthroplasty is incompletely imaged but appears grossly unremarkable. The lower thoracic spine is difficult to fully assess due to motion artifact.  IMPRESSION: 1. No evidence of traumatic injury to the abdomen or pelvis. 2. Likely small postoperative seroma in the soft tissues posterolateral to the left hip, measuring 3.6 cm. 3. Cholelithiasis noted; gallbladder otherwise unremarkable. 4. Mild bilateral renal atrophy noted; few right renal cysts seen. 5. Mild calcification along the abdominal aorta and its branches. 6. Scattered diverticulosis along the sigmoid colon, without evidence of diverticulitis.   Electronically Signed   By: Garald Balding M.D.   On: 04/03/2014 04:17   Dg Pelvis Portable  03/09/2014   CLINICAL DATA:  Postoperative left total hip joint replacement  EXAM: PORTABLE PELVIS 1-2 VIEWS  COMPARISON:  None.  FINDINGS: The patient has undergone left total hip joint prosthesis placement. Radiographic positioning of the prosthetic components is good. The native bone  exhibits no acute abnormality. There is soft tissue gas in the left hip surgical site.  IMPRESSION: There is no evidence of an immediate postprocedure  complication following left total hip joint prosthesis placement.   Electronically Signed   By: David  Martinique   On: 03/09/2014 16:15   Ct Hip Left Wo Contrast  03/08/2014   CLINICAL DATA Left hip pain and left upper leg pain for 2 weeks.  EXAM CT OF THE LEFT HIP WITHOUT CONTRAST  TECHNIQUE Multidetector CT imaging was performed according to the standard protocol. Multiplanar CT image reconstructions were also generated.  COMPARISON DG HIP COMPLETE*L* dated 03/08/2014  FINDINGS Comminuted acute to subacute appearing fracture of the femoral head with impaction and sclerotic femoral head fragments. The fracture extends from the articular surface to the femoral head neck junction. No dislocation.  Subcentimeter apparent loose bodies extending into the iliopsoas bursa.  Bone mineral density is decreased with enthesopathy at greater trochanter. No destructive bony lesions. Limited view of the pelvis demonstrates sigmoid diverticulosis and phleboliths.  IMPRESSION Comminuted femoral subcapital femur fracture extending through the head to the neck junction with impaction. Findings may reflect subacute insufficiency fracture. No dislocation.  Please note, mildly sclerotic fracture fragments may reflect impaction, though are nonspecific. This is highly unlikely to reflect pathologic fracture.  SIGNATURE  Electronically Signed   By: Elon Alas   On: 03/08/2014 02:50   Dg Chest Portable 1 View  04/03/2014   CLINICAL DATA:  Unwitnessed fall; concern for chest injury.  EXAM: PORTABLE CHEST - 1 VIEW  COMPARISON:  Chest radiograph performed 03/13/2014  FINDINGS: The lungs are well-aerated and clear. There is no evidence of focal opacification, pleural effusion or pneumothorax.  The cardiomediastinal silhouette is borderline normal in size. No acute osseous abnormalities  are seen.  IMPRESSION: No acute cardiopulmonary process seen. No displaced rib fractures identified.   Electronically Signed   By: Garald Balding M.D.   On: 04/03/2014 02:34   Dg Chest Port 1 View  03/13/2014   CLINICAL DATA:  Shortness of breath and fever.  EXAM: PORTABLE CHEST - 1 VIEW  COMPARISON:  Single view of the chest 03/09/2014.  FINDINGS: There is some coarsening of the pulmonary interstitium. No consolidative process, pneumothorax or effusion is identified. Heart size is upper normal.  IMPRESSION: No acute disease.   Electronically Signed   By: Inge Rise M.D.   On: 03/13/2014 21:51   Dg Chest Port 1 View  03/09/2014   CLINICAL DATA Preop hip fracture  EXAM PORTABLE CHEST - 1 VIEW  COMPARISON None.  FINDINGS Heart size is normal. Vascularity normal. Mild left lower lobe atelectasis. Negative for pneumonia or effusion. Apical pleural scarring bilaterally.  IMPRESSION Mild left lower lobe atelectasis.  SIGNATURE  Electronically Signed   By: Franchot Gallo M.D.   On: 03/09/2014 07:42   Dg Hip Portable 1 View Left  03/09/2014   CLINICAL DATA:  Status post left hip joint replacement.  EXAM: PORTABLE LEFT HIP - 1 VIEW  COMPARISON:  AP view of today's date  FINDINGS: The patient has undergone a left total hip joint prosthesis placement. Radiographic positioning of the prosthetic components is good. No fracture of the proximal femoral shaft is demonstrated.  IMPRESSION: There is no evidence of an immediate postprocedure complication following left total hip joint prosthesis placement.   Electronically Signed   By: David  Martinique   On: 03/09/2014 16:16    Microbiology: Recent Results (from the past 240 hour(s))  CULTURE, BLOOD (ROUTINE X 2)     Status: None   Collection Time    04/03/14  5:42 AM  Result Value Ref Range Status   Specimen Description BLOOD RIGHT UPPER ARM IV START   Final   Special Requests BOTTLES DRAWN AEROBIC AND ANAEROBIC 10CC EA   Final   Culture  Setup Time      Final   Value: 04/03/2014 08:40     Performed at [facility]   Culture     Final   Value:        BLOOD CULTURE RECEIVED NO GROWTH TO DATE CULTURE WILL BE HELD FOR 5 DAYS BEFORE ISSUING A FINAL NEGATIVE REPORT     Performed at [facility]   Report Status PENDING   Incomplete  CULTURE, BLOOD (ROUTINE X 2)     Status: None   Collection Time    04/03/14  8:10 AM      Result Value Ref Range Status   Specimen Description BLOOD RIGHT HAND   Final   Special Requests     Final   Value: BOTTLES DRAWN AEROBIC AND ANAEROBIC 10CC BLUE 5CC RED   Culture  Setup Time     Final   Value: 04/03/2014 12:26     Performed at [facility]   Culture     Final   Value:        BLOOD CULTURE RECEIVED NO GROWTH TO DATE CULTURE WILL BE HELD FOR 5 DAYS BEFORE ISSUING A FINAL NEGATIVE REPORT     Performed at [facility]   Report Status PENDING   Incomplete  WOUND CULTURE     Status: None   Collection Time    04/04/14 12:26 PM      Result Value Ref Range Status   Specimen Description WOUND HIP LEFT   Final   Special Requests Normal   Final   Gram Stain     Final   Value: MODERATE WBC PRESENT, PREDOMINANTLY PMN     RARE SQUAMOUS EPITHELIAL CELLS PRESENT     NO ORGANISMS SEEN     Performed at [facility]   Culture     Final   Value: FEW PSEUDOMONAS AERUGINOSA     Performed at [facility]   Report Status PENDING   Incomplete     Labs: Basic Metabolic Panel:  Recent Labs Lab 04/02/14 0030 04/05/14 0509  NA 139 141  K 4.0 4.5  CL 101 107  CO2 24 23  GLUCOSE 124* 110*  BUN 18 16  CREATININE 0.87 0.92  CALCIUM 8.9 8.5   Liver Function Tests:  Recent Labs Lab 04/02/14 0030  AST 57*  ALT 39*  ALKPHOS 407*  BILITOT 0.6  PROT 6.3  ALBUMIN 2.0*   CBC:  Recent Labs Lab 04/02/14 0030 04/05/14 0509  WBC 12.3* 9.3  NEUTROABS 9.8*  --   HGB 12.2 9.0*  HCT 36.2 27.6*  MCV 89.4 90.5  PLT 423* 375   CBG:  Recent Labs Lab  04/05/14 1102 04/05/14 1608 04/05/14 2135 04/06/14 0638 04/06/14 1115  GLUCAP 112* 132* 140* 68* 101*    Signed:  Barton Dubois  [facility] 04/06/2014, 11:50 AM

## 2014-04-07 DIAGNOSIS — I4729 Other ventricular tachycardia: Secondary | ICD-10-CM

## 2014-04-07 DIAGNOSIS — I472 Ventricular tachycardia: Secondary | ICD-10-CM

## 2014-04-07 LAB — WOUND CULTURE: Special Requests: NORMAL

## 2014-04-07 LAB — GLUCOSE, CAPILLARY
GLUCOSE-CAPILLARY: 79 mg/dL (ref 70–99)
Glucose-Capillary: 88 mg/dL (ref 70–99)

## 2014-04-07 NOTE — Progress Notes (Signed)
1320 report given to Ms tanya ,nurse from [facility]

## 2014-04-07 NOTE — Progress Notes (Signed)
Patient seena dn examined. No acute distress or major events overnight. Will continue with plan of discharge this morning. No changes to discharge summary or medication regimen required.  Barton Dubois [phone]

## 2014-04-09 LAB — CULTURE, BLOOD (ROUTINE X 2)
Culture: NO GROWTH
Culture: NO GROWTH

## 2014-04-09 NOTE — Clinical Social Work Psychosocial (Addendum)
    Clinical Social Work Department BRIEF PSYCHOSOCIAL ASSESSMENT 04/05/2014  Patient:  Michelle Yoder, Michelle Yoder     Account Number:  [id number]     Admit date:  04/02/2014  Clinical Social Worker:  Iona Coach  Date/Time:  04/05/2014 12:32 PM  Referred by:  CSW  Date Referred:  04/04/2014 Referred for  Other - See comment   Other Referral:   Return to SNF   Interview type:  Other - See comment Other interview type:   Daughter- Michelle Yoder    PSYCHOSOCIAL DATA Living Status:  FACILITY Admitted from facility:  [facility] Level of care:  [place] Primary support name:  Michelle Yoder  (c) [phone] Primary support relationship to patient:  CHILD, ADULT Degree of support available:   Strong support    CURRENT CONCERNS Current Concerns  Post-Acute Placement   Other Concerns:   Daughter is unhappy with care at [facility].  ? change facilities    SOCIAL WORK ASSESSMENT / PLAN 80 year old female, resident of [facility]. Patient is alert but quite confused;  she was awake during today's visit but did not interact with either her daughter or this CSW. Nurisng reports that patient frequently screams out and can be agitated with staff and daughter. CSW's daughter was in room and was feeding her mother at the time of the interview.  Daughter spoke in great detail about how unhappy she has been with her mother's care at her current facility and does not beleive that she wants her mother to return there.  She was even more concerned that her mother's Medicare days have been utilized at facility with very little progress shown.  CSW offered support and discussed other options of care including home with home health and private duty care as well as another SNF bed search. Daughter stated that she would consider all options.  CSW spoke to Lewis at [facility]. She is aware of daughter's unhappiness with the care provided but stated that they would accept  patient back should daughter wish to return there.  Fl2 placed on chart for MD's signature.   Assessment/plan status:  Psychosocial Support/Ongoing Assessment of Needs Other assessment/ plan:   Information/referral to community resources:   SNF list provided  Discussed private duty care and Home health services    PATIENT'S/FAMILY'S RESPONSE TO PLAN OF CARE: Patient is alert but very confused. She was unable to respond to the plan of care. Her daughter was noted to be very invovled and asked many questions about insurance and her mother's ongoing care.  Daughter verbalized feelings of frustration about her mother's poor progress at the SNF as well as the limitations of her insurance coverage.  CSW provided support and answered questions.  CSW will assist with d/c per daughters' wishes.

## 2014-04-09 NOTE — Clinical Social Work Placement (Addendum)
    Clinical Social Work Department CLINICAL SOCIAL WORK PLACEMENT NOTE 04/07/2014  Patient:  Michelle Yoder, Michelle Yoder  Account Number:  [id number] Admit date:  04/02/2014  Clinical Social Worker:  Butch Penny Aryona Sill, LCSWA  Date/time:  04/05/2014 01:00 PM  Clinical Social Work is seeking post-discharge placement for this patient at the following level of care:   SKILLED NURSING   (*CSW will update this form in Epic as items are completed)   04/05/2014  Patient/family provided with [facility] Department of Clinical Social Work's list of facilities offering this level of care within the geographic area requested by the patient (or if unable, by the patient's family).  04/05/2014  Patient/family informed of their freedom to choose among providers that offer the needed level of care, that participate in Medicare, Medicaid or managed care program needed by the patient, have an available bed and are willing to accept the patient.  04/05/2014  Patient/family informed of [facility]' ownership interest in [facility], as well as of the fact that they are under no obligation to receive care at this facility.  PASARR submitted to EDS on  PASARR number received from [facility] on   FL2 transmitted to all facilities in geographic area requested by pt/family on  04/06/2014 FL2 transmitted to all facilities within larger geographic area on   Patient informed that his/her managed care company has contracts with or will negotiate with  certain facilities, including the following:   Has existing PASARR  Sent to [facility] 04/06/14 per request of daughter     Patient/family informed of bed offers received:  04/06/2014 Patient chooses bed at [facility] Physician recommends and patient chooses bed at    Patient to be transferred to [facility] on  04/07/2014 Patient to be transferred to facility by Ambulance  Corey Harold)  The following physician request were entered in  Epic:   Additional Comments: 04/06/14  Bed offer recieved and accepted by pateint's daughter at [facility]. The SNF can accept patient tomorrow. per MD- patient is medically stable for d/c.  Daughter is agreeable to dc tomorrow.  04/07/14 OK per MD for d/c today to SNF at [facility] as daughter did not wish return to [facility]. Patient remains very confused and unable to state her wishes.  Nursing notified to call report.  Patient's daughter verbalized relief and to being extremely happy that her mother will be at [facility] as she feels she will receive very good care at the SNF  No further CSW needs identified. CSW signing off. Lorie Phenix. [place], [place]

## 2014-04-25 ENCOUNTER — Emergency Department (HOSPITAL_COMMUNITY): Payer: Medicare Other

## 2014-04-25 ENCOUNTER — Encounter (HOSPITAL_COMMUNITY): Payer: Self-pay | Admitting: Emergency Medicine

## 2014-04-25 ENCOUNTER — Inpatient Hospital Stay (HOSPITAL_COMMUNITY)
Admission: EM | Admit: 2014-04-25 | Discharge: 2014-05-02 | DRG: 498 | Disposition: A | Discharge status: Other Acute Inpatient Hospital | Payer: Medicare Other | Attending: Internal Medicine | Admitting: Internal Medicine

## 2014-04-25 DIAGNOSIS — T8149XA Infection following a procedure, other surgical site, initial encounter: Secondary | ICD-10-CM

## 2014-04-25 DIAGNOSIS — T8140XA Infection following a procedure, unspecified, initial encounter: Secondary | ICD-10-CM

## 2014-04-25 DIAGNOSIS — D62 Acute posthemorrhagic anemia: Secondary | ICD-10-CM

## 2014-04-25 DIAGNOSIS — F0391 Unspecified dementia with behavioral disturbance: Secondary | ICD-10-CM | POA: Diagnosis present

## 2014-04-25 DIAGNOSIS — Z87891 Personal history of nicotine dependence: Secondary | ICD-10-CM

## 2014-04-25 DIAGNOSIS — F05 Delirium due to known physiological condition: Secondary | ICD-10-CM

## 2014-04-25 DIAGNOSIS — I10 Essential (primary) hypertension: Secondary | ICD-10-CM

## 2014-04-25 DIAGNOSIS — T8450XA Infection and inflammatory reaction due to unspecified internal joint prosthesis, initial encounter: Principal | ICD-10-CM | POA: Diagnosis present

## 2014-04-25 DIAGNOSIS — Z96649 Presence of unspecified artificial hip joint: Secondary | ICD-10-CM

## 2014-04-25 DIAGNOSIS — G9341 Metabolic encephalopathy: Secondary | ICD-10-CM | POA: Diagnosis present

## 2014-04-25 DIAGNOSIS — Z79899 Other long term (current) drug therapy: Secondary | ICD-10-CM

## 2014-04-25 DIAGNOSIS — A498 Other bacterial infections of unspecified site: Secondary | ICD-10-CM

## 2014-04-25 DIAGNOSIS — R6521 Severe sepsis with septic shock: Secondary | ICD-10-CM

## 2014-04-25 DIAGNOSIS — I4729 Other ventricular tachycardia: Secondary | ICD-10-CM

## 2014-04-25 DIAGNOSIS — E119 Type 2 diabetes mellitus without complications: Secondary | ICD-10-CM

## 2014-04-25 DIAGNOSIS — L03119 Cellulitis of unspecified part of limb: Secondary | ICD-10-CM

## 2014-04-25 DIAGNOSIS — E1169 Type 2 diabetes mellitus with other specified complication: Secondary | ICD-10-CM | POA: Diagnosis present

## 2014-04-25 DIAGNOSIS — F03918 Unspecified dementia, unspecified severity, with other behavioral disturbance: Secondary | ICD-10-CM

## 2014-04-25 DIAGNOSIS — M069 Rheumatoid arthritis, unspecified: Secondary | ICD-10-CM

## 2014-04-25 DIAGNOSIS — F919 Conduct disorder, unspecified: Secondary | ICD-10-CM | POA: Diagnosis present

## 2014-04-25 DIAGNOSIS — Z8614 Personal history of Methicillin resistant Staphylococcus aureus infection: Secondary | ICD-10-CM

## 2014-04-25 DIAGNOSIS — M109 Gout, unspecified: Secondary | ICD-10-CM | POA: Diagnosis present

## 2014-04-25 DIAGNOSIS — I129 Hypertensive chronic kidney disease with stage 1 through stage 4 chronic kidney disease, or unspecified chronic kidney disease: Secondary | ICD-10-CM | POA: Diagnosis present

## 2014-04-25 DIAGNOSIS — I472 Ventricular tachycardia: Secondary | ICD-10-CM

## 2014-04-25 DIAGNOSIS — IMO0002 Reserved for concepts with insufficient information to code with codable children: Secondary | ICD-10-CM

## 2014-04-25 DIAGNOSIS — Z7982 Long term (current) use of aspirin: Secondary | ICD-10-CM

## 2014-04-25 DIAGNOSIS — D649 Anemia, unspecified: Secondary | ICD-10-CM | POA: Diagnosis present

## 2014-04-25 DIAGNOSIS — F0390 Unspecified dementia without behavioral disturbance: Secondary | ICD-10-CM

## 2014-04-25 DIAGNOSIS — A4101 Sepsis due to Methicillin susceptible Staphylococcus aureus: Secondary | ICD-10-CM

## 2014-04-25 DIAGNOSIS — I498 Other specified cardiac arrhythmias: Secondary | ICD-10-CM | POA: Diagnosis present

## 2014-04-25 DIAGNOSIS — R7881 Bacteremia: Secondary | ICD-10-CM | POA: Diagnosis present

## 2014-04-25 DIAGNOSIS — B9561 Methicillin susceptible Staphylococcus aureus infection as the cause of diseases classified elsewhere: Secondary | ICD-10-CM

## 2014-04-25 DIAGNOSIS — A4189 Other specified sepsis: Secondary | ICD-10-CM

## 2014-04-25 DIAGNOSIS — I509 Heart failure, unspecified: Secondary | ICD-10-CM | POA: Diagnosis present

## 2014-04-25 DIAGNOSIS — S72002A Fracture of unspecified part of neck of left femur, initial encounter for closed fracture: Secondary | ICD-10-CM

## 2014-04-25 DIAGNOSIS — S7292XA Unspecified fracture of left femur, initial encounter for closed fracture: Secondary | ICD-10-CM

## 2014-04-25 DIAGNOSIS — H547 Unspecified visual loss: Secondary | ICD-10-CM

## 2014-04-25 DIAGNOSIS — Y831 Surgical operation with implant of artificial internal device as the cause of abnormal reaction of the patient, or of later complication, without mention of misadventure at the time of the procedure: Secondary | ICD-10-CM | POA: Diagnosis present

## 2014-04-25 DIAGNOSIS — R000 Tachycardia, unspecified: Secondary | ICD-10-CM

## 2014-04-25 DIAGNOSIS — E46 Unspecified protein-calorie malnutrition: Secondary | ICD-10-CM | POA: Diagnosis present

## 2014-04-25 DIAGNOSIS — K219 Gastro-esophageal reflux disease without esophagitis: Secondary | ICD-10-CM | POA: Diagnosis present

## 2014-04-25 DIAGNOSIS — A419 Sepsis, unspecified organism: Secondary | ICD-10-CM

## 2014-04-25 DIAGNOSIS — L02419 Cutaneous abscess of limb, unspecified: Secondary | ICD-10-CM | POA: Diagnosis present

## 2014-04-25 DIAGNOSIS — T8459XA Infection and inflammatory reaction due to other internal joint prosthesis, initial encounter: Secondary | ICD-10-CM

## 2014-04-25 DIAGNOSIS — N182 Chronic kidney disease, stage 2 (mild): Secondary | ICD-10-CM

## 2014-04-25 DIAGNOSIS — M869 Osteomyelitis, unspecified: Secondary | ICD-10-CM | POA: Diagnosis present

## 2014-04-25 DIAGNOSIS — M9080 Osteopathy in diseases classified elsewhere, unspecified site: Secondary | ICD-10-CM | POA: Diagnosis present

## 2014-04-25 DIAGNOSIS — R6510 Systemic inflammatory response syndrome (SIRS) of non-infectious origin without acute organ dysfunction: Secondary | ICD-10-CM

## 2014-04-25 DIAGNOSIS — R6520 Severe sepsis without septic shock: Secondary | ICD-10-CM

## 2014-04-25 LAB — CBC WITH DIFFERENTIAL/PLATELET
Basophils Absolute: 0 10*3/uL (ref 0.0–0.1)
Basophils Relative: 0 % (ref 0–1)
EOS ABS: 0 10*3/uL (ref 0.0–0.7)
Eosinophils Relative: 0 % (ref 0–5)
HCT: 20.1 % — ABNORMAL LOW (ref 36.0–46.0)
HEMOGLOBIN: 6.5 g/dL — AB (ref 12.0–15.0)
LYMPHS ABS: 1 10*3/uL (ref 0.7–4.0)
LYMPHS PCT: 6 % — AB (ref 12–46)
MCH: 30.4 pg (ref 26.0–34.0)
MCHC: 32.3 g/dL (ref 30.0–36.0)
MCV: 93.9 fL (ref 78.0–100.0)
MONOS PCT: 7 % (ref 3–12)
Monocytes Absolute: 1.2 10*3/uL — ABNORMAL HIGH (ref 0.1–1.0)
NEUTROS PCT: 86 % — AB (ref 43–77)
Neutro Abs: 13.4 10*3/uL — ABNORMAL HIGH (ref 1.7–7.7)
Platelets: 402 10*3/uL — ABNORMAL HIGH (ref 150–400)
RBC: 2.14 MIL/uL — AB (ref 3.87–5.11)
RDW: 19.3 % — ABNORMAL HIGH (ref 11.5–15.5)
WBC: 15.5 10*3/uL — AB (ref 4.0–10.5)

## 2014-04-25 LAB — URINALYSIS, ROUTINE W REFLEX MICROSCOPIC
BILIRUBIN URINE: NEGATIVE
Glucose, UA: NEGATIVE mg/dL
Hgb urine dipstick: NEGATIVE
Ketones, ur: NEGATIVE mg/dL
Leukocytes, UA: NEGATIVE
Nitrite: NEGATIVE
Protein, ur: 30 mg/dL — AB
SPECIFIC GRAVITY, URINE: 1.026 (ref 1.005–1.030)
UROBILINOGEN UA: 0.2 mg/dL (ref 0.0–1.0)
pH: 5 (ref 5.0–8.0)

## 2014-04-25 LAB — URINE MICROSCOPIC-ADD ON

## 2014-04-25 LAB — COMPREHENSIVE METABOLIC PANEL
ALK PHOS: 250 U/L — AB (ref 39–117)
ALT: 28 U/L (ref 0–35)
AST: 43 U/L — ABNORMAL HIGH (ref 0–37)
Albumin: 1.7 g/dL — ABNORMAL LOW (ref 3.5–5.2)
BUN: 23 mg/dL (ref 6–23)
CO2: 21 meq/L (ref 19–32)
Calcium: 9.4 mg/dL (ref 8.4–10.5)
Chloride: 105 mEq/L (ref 96–112)
Creatinine, Ser: 0.76 mg/dL (ref 0.50–1.10)
GFR, EST AFRICAN AMERICAN: 85 mL/min — AB (ref >90)
GFR, EST NON AFRICAN AMERICAN: 73 mL/min — AB (ref >90)
GLUCOSE: 163 mg/dL — AB (ref 70–99)
POTASSIUM: 4.6 meq/L (ref 3.7–5.3)
SODIUM: 140 meq/L (ref 137–147)
TOTAL PROTEIN: 5.7 g/dL — AB (ref 6.0–8.3)
Total Bilirubin: 0.7 mg/dL (ref 0.3–1.2)

## 2014-04-25 LAB — PREPARE RBC (CROSSMATCH)

## 2014-04-25 LAB — CBG MONITORING, ED: GLUCOSE-CAPILLARY: 119 mg/dL — AB (ref 70–99)

## 2014-04-25 LAB — PROCALCITONIN: Procalcitonin: 1.35 ng/mL

## 2014-04-25 LAB — APTT: APTT: 29 s (ref 24–37)

## 2014-04-25 LAB — LACTIC ACID, PLASMA: LACTIC ACID, VENOUS: 1.7 mmol/L (ref 0.5–2.2)

## 2014-04-25 LAB — PRO B NATRIURETIC PEPTIDE: Pro B Natriuretic peptide (BNP): 4705 pg/mL — ABNORMAL HIGH (ref 0–450)

## 2014-04-25 LAB — PROTIME-INR
INR: 1.07 (ref 0.00–1.49)
Prothrombin Time: 13.7 seconds (ref 11.6–15.2)

## 2014-04-25 LAB — I-STAT CG4 LACTIC ACID, ED: LACTIC ACID, VENOUS: 1.86 mmol/L (ref 0.5–2.2)

## 2014-04-25 LAB — TROPONIN I

## 2014-04-25 LAB — POC OCCULT BLOOD, ED: FECAL OCCULT BLD: NEGATIVE

## 2014-04-25 MED ORDER — ACETAMINOPHEN 650 MG RE SUPP
650.0000 mg | Freq: Once | RECTAL | Status: AC
  Administered 2014-04-25: 650 mg via RECTAL
  Filled 2014-04-25: qty 1

## 2014-04-25 MED ORDER — SODIUM CHLORIDE 0.9 % IV SOLN
INTRAVENOUS | Status: DC
Start: 2014-04-25 — End: 2014-05-02
  Administered 2014-04-26 – 2014-04-27 (×3): via INTRAVENOUS
  Administered 2014-04-28: 75 mL/h via INTRAVENOUS
  Administered 2014-04-29 – 2014-04-30 (×2): 1000 mL via INTRAVENOUS
  Administered 2014-05-01: 01:00:00 via INTRAVENOUS

## 2014-04-25 MED ORDER — ACETAMINOPHEN 325 MG RE SUPP
325.0000 mg | Freq: Once | RECTAL | Status: AC
  Administered 2014-04-25: 325 mg via RECTAL
  Filled 2014-04-25: qty 1

## 2014-04-25 MED ORDER — SODIUM CHLORIDE 0.9 % IV SOLN
1000.0000 mL | Freq: Once | INTRAVENOUS | Status: AC
  Administered 2014-04-25: 1000 mL via INTRAVENOUS

## 2014-04-25 MED ORDER — DEXTROSE 5 % IV SOLN
1.0000 g | INTRAVENOUS | Status: DC
  Administered 2014-04-25: 1 g via INTRAVENOUS
  Filled 2014-04-25 (×2): qty 1

## 2014-04-25 MED ORDER — ACETAMINOPHEN 325 MG PO TABS: 650.0000 mg | ORAL_TABLET | Freq: Once | ORAL | Status: DC

## 2014-04-25 MED ORDER — INSULIN ASPART 100 UNIT/ML ~~LOC~~ SOLN: 0.0000 [IU] | SUBCUTANEOUS | Status: DC

## 2014-04-25 MED ORDER — VANCOMYCIN HCL IN DEXTROSE 1-5 GM/200ML-% IV SOLN: 1000.0000 mg | Freq: Once | INTRAVENOUS | Status: DC

## 2014-04-25 MED ORDER — SODIUM CHLORIDE 0.9 % IV SOLN
1000.0000 mL | INTRAVENOUS | Status: DC
  Administered 2014-04-25: 1000 mL via INTRAVENOUS

## 2014-04-25 MED ORDER — MORPHINE SULFATE 2 MG/ML IJ SOLN
2.0000 mg | Freq: Once | INTRAMUSCULAR | Status: AC
  Administered 2014-04-26: 2 mg via INTRAVENOUS
  Filled 2014-04-25: qty 1

## 2014-04-25 MED ORDER — HALOPERIDOL LACTATE 5 MG/ML IJ SOLN
0.2500 mg | Freq: Two times a day (BID) | INTRAMUSCULAR | Status: DC | PRN
  Administered 2014-04-26 – 2014-04-27 (×2): 0.25 mg via INTRAMUSCULAR
  Filled 2014-04-25 (×2): qty 1

## 2014-04-25 MED ORDER — DEXTROSE 5 % IV SOLN
1.0000 g | Freq: Once | INTRAVENOUS | Status: DC
  Filled 2014-04-25: qty 1

## 2014-04-25 MED ORDER — VANCOMYCIN HCL 500 MG IV SOLR
500.0000 mg | Freq: Two times a day (BID) | INTRAVENOUS | Status: DC
  Administered 2014-04-25 – 2014-04-27 (×4): 500 mg via INTRAVENOUS
  Filled 2014-04-25 (×7): qty 500

## 2014-04-25 NOTE — ED Notes (Signed)
IV team paged.  

## 2014-04-25 NOTE — ED Notes (Signed)
IV attempted x's 1 without success.  IV team called per pt's family request.

## 2014-04-25 NOTE — ED Notes (Signed)
Pt remains in x-ray at this time.  Received call from Dr. Percell Miller who wishes to talk to family.  Family not in pt's room at this time.  Will return his call when family returns.

## 2014-04-25 NOTE — H&P (Addendum)
Hospitalist Admission History and Physical  Patient name: Michelle Yoder Medical record number: [id number] Date of birth: 05/20/1923 Age: 80 y.o. Gender: female  Primary Care Provider: [name], MD  Chief Complaint: sepsis, L hip abscess, anemia, encephalopathy   History of Present Illness:This is a 80 y.o. year old female with prior left hip fracture status post repair March 2015, dementia, hypertension presented with sepsis, left hip abscess, anemia. Patient was noted to have fallen and broken her left hip about 6 weeks ago. Per the daughter, this was initially listed the original nursing home. Once imaging was obtained, patient was directed to [facility] for further evaluation. Patient has surgical repair of left hip on March 12. Per the daughter, since that time, patient has had recurrent episodes of fever and confusion. Patient was last admitted March 5 to the 10th for SIRS and delirium. Had a left hip wound culture that grew out pseudomonas. Pansensitive culture. Was discharged on 10 days of cipro to SNF. Completed course. Per the daughter, pt had fever within last 24 hours. Frank pus was noted to be draining from hip wound site. [facility] was contacted. Recommendation was for pt to go to ER for further evaluation. Daughter states that pt has had persistent confusion and decreased appetite since hip fracture. 1 episoded of emesis. Thought to be NBNB. Daughter also reports some ? l sided visual loss since L hip fracture. Denies any overt hemiparesis.  In ER, WBC 15.5, Hgb 6.5. Hemoccult negative, Tmax 100.2. Hemodynamically stable. Lactate 1.9. L hip and CXR WNL. Per report, ER PA discussed case with Dr. Percell Miller this am. Initial plan was to hold abx and plan for operative I and D in am. Vanc and cefepime started as clinical picture seemed to mildly worsen.   Patient Active Problem List   Diagnosis Date Noted  . Sepsis 04/25/2014  . Non-sustained ventricular tachycardia 04/04/2014  .  SIRS (systemic inflammatory response syndrome) 04/03/2014  . Delirium due to medical condition with behavioral disturbance 04/03/2014  . Vision problem 03/27/2014  . Postoperative anemia due to acute blood loss 03/15/2014  . Hip fracture, left 03/08/2014  . Femur fracture, left 03/08/2014  . RA (rheumatoid arthritis) 03/08/2014  . Chronic use of steroids 03/08/2014  . Diabetes mellitus without complication   . Dementia   . Hypertension    Past Medical History: Past Medical History  Diagnosis Date  . Arthritis   . Gout   . Osteoarthritis   . Diabetes mellitus without complication   . Hypertension   . Dementia   . Cancer   . Anemia   . GERD (gastroesophageal reflux disease)     Past Surgical History: Past Surgical History  Procedure Laterality Date  . Mastectomy    . Abdominal hysterectomy    . Hip arthroplasty Left 03/09/2014    Procedure: ARTHROPLASTY BIPOLAR HIP;  Surgeon: [name], MD;  Location: [facility];  Service: Orthopedics;  Laterality: Left;    Social History: History   Social History  . Marital Status: Widowed    Spouse Name: N/A    Number of Children: N/A  . Years of Education: N/A   Social History Main Topics  . Smoking status: Former Research scientist (life sciences)  . Smokeless tobacco: None  . Alcohol Use: No  . Drug Use: No  . Sexual Activity: None   Other Topics Concern  . None   Social History Narrative  . None    Family History: No family history on file.  Allergies: No  Known Allergies  Current Facility-Administered Medications  Medication Dose Route Frequency Provider Last Rate Last Dose  . 0.9 %  sodium chloride infusion  1,000 mL Intravenous Continuous Nicole Pisciotta, PA-C      . 0.9 %  sodium chloride infusion   Intravenous Continuous Shanda Howells, MD      . morphine 2 MG/ML injection 2 mg  2 mg Intravenous Once Monico Blitz, PA-C       Current Outpatient Prescriptions  Medication Sig Dispense Refill  . aspirin 325 MG tablet Take 325 mg  by mouth 2 (two) times daily.      Marland Kitchen aspirin EC 81 MG tablet Take 1 tablet (81 mg total) by mouth daily.      . B Complex Vitamins (VITAMIN B COMPLEX PO) Take 1 capsule by mouth daily.      . cephALEXin (KEFLEX) 500 MG capsule Take 500 mg by mouth once.      . Cholecalciferol (VITAMIN D-3) 1000 UNITS CAPS Take 1 capsule by mouth daily.      . citalopram (CELEXA) 10 MG tablet Take 10 mg by mouth daily.      Marland Kitchen docusate sodium (COLACE) 100 MG capsule Take 1 capsule (100 mg total) by mouth 2 (two) times daily.  10 capsule  0  . feeding supplement, GLUCERNA SHAKE, (GLUCERNA SHAKE) LIQD Take 237 mLs by mouth 2 (two) times daily between meals.    0  . ferrous sulfate 325 (65 FE) MG tablet Take 325 mg by mouth daily with breakfast.      . HYDROcodone-acetaminophen (NORCO) 5-325 MG per tablet Take 1 tablet by mouth every 6 (six) hours as needed for moderate pain.  30 tablet  0  . leflunomide (ARAVA) 20 MG tablet Take 20 mg by mouth daily.      Marland Kitchen lidocaine (LIDODERM) 5 % Place 1 patch onto the skin daily. Remove & Discard patch within 12 hours or as directed by MD      . LORazepam (ATIVAN) 0.5 MG tablet Take 0.5 mg by mouth every 12 (twelve) hours as needed for anxiety.      . Memantine HCl ER 7 MG CP24 Take 1 capsule by mouth daily with breakfast.      . metoprolol tartrate (LOPRESSOR) 25 MG tablet Take 12.5 mg by mouth 2 (two) times daily.      . Multiple Vitamins-Minerals (THERADEX M PO) Take 1 tablet by mouth daily.      Marland Kitchen omeprazole (PRILOSEC) 20 MG capsule Take 20 mg by mouth daily.      . predniSONE (DELTASONE) 2.5 MG tablet Take 2.5-5 mg by mouth 2 (two) times daily. Give 2 tablets (5 mg) in the morning and 1 tablet (2.5 mg) in the evening      . risperiDONE (RISPERDAL) 0.25 MG tablet Take 1 tablet (0.25 mg total) by mouth at bedtime.      . simethicone (MI-ACID GAS RELIEF) 80 MG chewable tablet Chew 80 mg by mouth 2 (two) times daily.      . sitaGLIPtin (JANUVIA) 25 MG tablet Take 1 tablet (25 mg  total) by mouth daily.      . vitamin C (ASCORBIC ACID) 500 MG tablet Take 500 mg by mouth daily.      . magnesium oxide (MAGOX 400) 400 (241.3 MG) MG tablet Take 1 tablet (400 mg total) by mouth daily.       Review Of Systems: 12 point ROS negative except as noted above in HPI.  Physical Exam:  Filed Vitals:   04/25/14 1510  BP:   Pulse:   Temp: 100.2 F (37.9 C)  Resp:     General: confused, mildly lethargic, repsonsive to sternal rubbing HEENT: PERRLA and extra ocular movement intact Heart: S1, S2 normal, no murmur, rub or gallop, regular rate and rhythm Lungs: clear to auscultation, no wheezes or rales and unlabored breathing Abdomen: abdomen is soft without significant tenderness, masses, organomegaly or guarding Extremities: Frank pus draining from L hip incision site, minimal peripheral erythema  Skin:as above  Neurology: minimall cooperative to exam, no focal deficits noted.   Labs and Imaging: Lab Results  Component Value Date/Time   NA 140 04/25/2014  3:43 PM   K 4.6 04/25/2014  3:43 PM   CL 105 04/25/2014  3:43 PM   CO2 21 04/25/2014  3:43 PM   BUN 23 04/25/2014  3:43 PM   CREATININE 0.76 04/25/2014  3:43 PM   GLUCOSE 163* 04/25/2014  3:43 PM   Lab Results  Component Value Date   WBC 15.5* 04/25/2014   HGB 6.5* 04/25/2014   HCT 20.1* 04/25/2014   MCV 93.9 04/25/2014   PLT 402* 04/25/2014   Urinalysis    Component Value Date/Time   COLORURINE YELLOW 04/25/2014 1926   APPEARANCEUR CLOUDY* 04/25/2014 1926   LABSPEC 1.026 04/25/2014 1926   PHURINE 5.0 04/25/2014 1926   GLUCOSEU NEGATIVE 04/25/2014 1926   HGBUR NEGATIVE 04/25/2014 1926   BILIRUBINUR NEGATIVE 04/25/2014 1926   KETONESUR NEGATIVE 04/25/2014 1926   PROTEINUR 30* 04/25/2014 1926   UROBILINOGEN 0.2 04/25/2014 1926   NITRITE NEGATIVE 04/25/2014 1926   LEUKOCYTESUR NEGATIVE 04/25/2014 1926       Dg Chest 1 View  04/25/2014   CLINICAL DATA:  infection  EXAM: CHEST - 1 VIEW  COMPARISON:  DG CHEST 1V PORT dated  04/03/2014  FINDINGS: The heart size and mediastinal contours are within normal limits. Both lungs are clear. Degenerative changes are appreciated within the left shoulder.  IMPRESSION: No active disease.   Electronically Signed   By: Margaree Mackintosh M.D.   On: 04/25/2014 17:34   Dg Hip Complete Left  04/25/2014   CLINICAL DATA:  infection, pain  EXAM: LEFT HIP - COMPLETE 2+ VIEW  COMPARISON:  DG HIP PORTABLE 1 VIEW*L* dated 03/09/2014; DG HIP COMPLETE*L* dated 03/08/2014  FINDINGS: There is no evidence of hip fracture or dislocation. There is no evidence of arthropathy or other focal bone abnormality. The patient's total left hip arthroplasty is intact without evidence of loosening or failure. There is no evidence of subcutaneous emphysema nor evidence of cortical irregularity.  IMPRESSION: Negative.   Electronically Signed   By: Margaree Mackintosh M.D.   On: 04/25/2014 17:33     Assessment and Plan: Zilda Bomberger is a 80 y.o. year old female presenting with sepsis, l hip abscess, anemia, encephalopathy   Sepsis: Likely secondary to active left hip abscess. Formal orthopedic consult pending. Wound culture. Continue VAC and cefepime. This was discussed with orthopedics. Hemodynamically stable. Lactate within normal limits. Does seem to be clinically dry on exam with overall poor vascular access. Plan for critical care to place central line to help with blood draws and IV fluids. Also curbsided about the case. Panculture. ProCalcitonin.  Encephalopathy: Likely secondary to above contribution dehydration and mild delirium. Hydrate patient. IV antibiotics. Continue to follow. Consider IM Haldol when necessary for agitation.   Anemia: Of unclear etiology. Hemoccult preliminarily negative. Follow up pending final result. ? Intra-articular bleeding from left hip.  No frank blood from surgical site. Transfuse 2 units of packed red cells and reassess. Hydrate patient.  Diabetes: Sliding scale insulin.  HTN:  hemodynamically stable. Hold oral meds as to avoid hypotension in the interim. Continue to follow.   FEN/GI: N.p.o. Noted elevated ALP. Likely secondary to L hip pathology. Continue to follow.   Prophylaxis: SCDs Disposition: Pending further evaluation. Had a fairly lengthy discussion with patient's daughter at the bedside. Primary issue is stabilizing  patient from a medical standpoint with pending surgical followup. Daughter expressed understanding. Code Status: Full code       Shanda Howells MD  Pager: [phone]

## 2014-04-25 NOTE — ED Provider Notes (Signed)
CSN: [id number]     Arrival date & time 04/25/14  1413 History   First MD Initiated Contact with Patient 04/25/14 1530     Chief Complaint  Patient presents with  . Wound Infection     (Consider location/radiation/quality/duration/timing/severity/associated sxs/prior Treatment) HPI  Michelle Yoder is a 80 y.o. female accompanied by her daughter who supplies the history. Patient had left hip replacement by [facility] approximately 3 weeks ago, had postop wound infection with surgeon was readmitted to the hospital, she was discharged 2 weeks ago and has since had increasing purulent drainage from the surgical site, low-grade temperature and increasingly combative behavior. Level V caveat secondary to dementia.  Past Medical History  Diagnosis Date  . Arthritis   . Gout   . Osteoarthritis   . Diabetes mellitus without complication   . Hypertension   . Dementia   . Cancer   . Anemia   . GERD (gastroesophageal reflux disease)    Past Surgical History  Procedure Laterality Date  . Mastectomy    . Abdominal hysterectomy    . Hip arthroplasty Left 03/09/2014    Procedure: ARTHROPLASTY BIPOLAR HIP;  Surgeon: [name], MD;  Location: [place];  Service: Orthopedics;  Laterality: Left;   No family history on file. History  Substance Use Topics  . Smoking status: Former Research scientist (life sciences)  . Smokeless tobacco: Not on file  . Alcohol Use: No   OB History   Grav Para Term Preterm Abortions TAB SAB Ect Mult Living                 Review of Systems  Unable to perform ROS: Dementia      Allergies  Review of patient's allergies indicates no known allergies.  Home Medications   Prior to Admission medications   Medication Sig Start Date End Date Taking? Authorizing Provider  aspirin 325 MG tablet Take 325 mg by mouth 2 (two) times daily.   Yes Historical Provider, MD  aspirin EC 81 MG tablet Take 1 tablet (81 mg total) by mouth daily. 04/14/14  Yes Barton Dubois, MD  B Complex  Vitamins (VITAMIN B COMPLEX PO) Take 1 capsule by mouth daily.   Yes Historical Provider, MD  cephALEXin (KEFLEX) 500 MG capsule Take 500 mg by mouth once.   Yes Historical Provider, MD  Cholecalciferol (VITAMIN D-3) 1000 UNITS CAPS Take 1 capsule by mouth daily.   Yes Historical Provider, MD  citalopram (CELEXA) 10 MG tablet Take 10 mg by mouth daily.   Yes Historical Provider, MD  docusate sodium (COLACE) 100 MG capsule Take 1 capsule (100 mg total) by mouth 2 (two) times daily. 03/09/14  Yes M. Doran Stabler, PA-C  feeding supplement, GLUCERNA SHAKE, (GLUCERNA SHAKE) LIQD Take 237 mLs by mouth 2 (two) times daily between meals. 04/06/14  Yes Barton Dubois, MD  ferrous sulfate 325 (65 FE) MG tablet Take 325 mg by mouth daily with breakfast.   Yes Historical Provider, MD  HYDROcodone-acetaminophen (NORCO) 5-325 MG per tablet Take 1 tablet by mouth every 6 (six) hours as needed for moderate pain. 04/06/14  Yes Barton Dubois, MD  leflunomide (ARAVA) 20 MG tablet Take 20 mg by mouth daily.   Yes Historical Provider, MD  lidocaine (LIDODERM) 5 % Place 1 patch onto the skin daily. Remove & Discard patch within 12 hours or as directed by MD   Yes Historical Provider, MD  LORazepam (ATIVAN) 0.5 MG tablet Take 0.5 mg by mouth every 12 (twelve) hours as  needed for anxiety.   Yes Historical Provider, MD  Memantine HCl ER 7 MG CP24 Take 1 capsule by mouth daily with breakfast.   Yes Historical Provider, MD  metoprolol tartrate (LOPRESSOR) 25 MG tablet Take 12.5 mg by mouth 2 (two) times daily.   Yes Historical Provider, MD  Multiple Vitamins-Minerals (THERADEX M PO) Take 1 tablet by mouth daily.   Yes Historical Provider, MD  omeprazole (PRILOSEC) 20 MG capsule Take 20 mg by mouth daily.   Yes Historical Provider, MD  predniSONE (DELTASONE) 2.5 MG tablet Take 2.5-5 mg by mouth 2 (two) times daily. Give 2 tablets (5 mg) in the morning and 1 tablet (2.5 mg) in the evening   Yes Historical Provider, MD  risperiDONE  (RISPERDAL) 0.25 MG tablet Take 1 tablet (0.25 mg total) by mouth at bedtime. 04/06/14  Yes Barton Dubois, MD  simethicone (MI-ACID GAS RELIEF) 80 MG chewable tablet Chew 80 mg by mouth 2 (two) times daily.   Yes Historical Provider, MD  sitaGLIPtin (JANUVIA) 25 MG tablet Take 1 tablet (25 mg total) by mouth daily. 04/06/14  Yes Barton Dubois, MD  vitamin C (ASCORBIC ACID) 500 MG tablet Take 500 mg by mouth daily.   Yes Historical Provider, MD  magnesium oxide (MAGOX 400) 400 (241.3 MG) MG tablet Take 1 tablet (400 mg total) by mouth daily. 04/06/14   Barton Dubois, MD   BP 142/75  Pulse 96  Temp(Src) 100.2 F (37.9 C) (Rectal)  Resp 18  SpO2 100%  Physical Exam  Nursing note and vitals reviewed. Constitutional: She appears well-developed and well-nourished. No distress.  HENT:  Head: Normocephalic.  Dry MM  Eyes: Conjunctivae and EOM are normal. Pupils are equal, round, and reactive to light.  Neck: Normal range of motion.  Cardiovascular: Normal rate, regular rhythm and intact distal pulses.   Pulmonary/Chest: Effort normal and breath sounds normal. No stridor.  Abdominal: Bowel sounds are normal. She exhibits no distension. There is no tenderness. There is no rebound and no guarding.  Musculoskeletal: Normal range of motion.  Neurological: She is alert.  Combative, demented  Skin:  Left hip incision with purulent drainage no surrounding cellultitis  Psychiatric: She has a normal mood and affect.    ED Course  Procedures (including critical care time) Labs Review Labs Reviewed  CBC WITH DIFFERENTIAL - Abnormal; Notable for the following:    WBC 15.5 (*)    RBC 2.14 (*)    Hemoglobin 6.5 (*)    HCT 20.1 (*)    RDW 19.3 (*)    Platelets 402 (*)    Neutrophils Relative % 86 (*)    Neutro Abs 13.4 (*)    Lymphocytes Relative 6 (*)    Monocytes Absolute 1.2 (*)    All other components within normal limits  COMPREHENSIVE METABOLIC PANEL - Abnormal; Notable for the following:     Glucose, Bld 163 (*)    Total Protein 5.7 (*)    Albumin 1.7 (*)    AST 43 (*)    Alkaline Phosphatase 250 (*)    GFR calc non Af Amer 73 (*)    GFR calc Af Amer 85 (*)    All other components within normal limits  URINALYSIS, ROUTINE W REFLEX MICROSCOPIC - Abnormal; Notable for the following:    APPearance CLOUDY (*)    Protein, ur 30 (*)    All other components within normal limits  PRO B NATRIURETIC PEPTIDE - Abnormal; Notable for the following:  Pro B Natriuretic peptide (BNP) 4705.0 (*)    All other components within normal limits  CBG MONITORING, ED - Abnormal; Notable for the following:    Glucose-Capillary 119 (*)    All other components within normal limits  CULTURE, BLOOD (ROUTINE X 2)  CULTURE, BLOOD (ROUTINE X 2)  URINE CULTURE  WOUND CULTURE  APTT  TROPONIN I  LACTIC ACID, PLASMA  PROTIME-INR  URINE MICROSCOPIC-ADD ON  PROCALCITONIN  COMPREHENSIVE METABOLIC PANEL  CBC WITH DIFFERENTIAL  I-STAT CG4 LACTIC ACID, ED  POC OCCULT BLOOD, ED  TYPE AND SCREEN  PREPARE RBC (CROSSMATCH)  PREPARE RBC (CROSSMATCH)    Imaging Review Dg Chest 1 View  04/25/2014   CLINICAL DATA:  infection  EXAM: CHEST - 1 VIEW  COMPARISON:  DG CHEST 1V PORT dated 04/03/2014  FINDINGS: The heart size and mediastinal contours are within normal limits. Both lungs are clear. Degenerative changes are appreciated within the left shoulder.  IMPRESSION: No active disease.   Electronically Signed   By: Margaree Mackintosh M.D.   On: 04/25/2014 17:34   Dg Hip Complete Left  04/25/2014   CLINICAL DATA:  infection, pain  EXAM: LEFT HIP - COMPLETE 2+ VIEW  COMPARISON:  DG HIP PORTABLE 1 VIEW*L* dated 03/09/2014; DG HIP COMPLETE*L* dated 03/08/2014  FINDINGS: There is no evidence of hip fracture or dislocation. There is no evidence of arthropathy or other focal bone abnormality. The patient's total left hip arthroplasty is intact without evidence of loosening or failure. There is no evidence of subcutaneous  emphysema nor evidence of cortical irregularity.  IMPRESSION: Negative.   Electronically Signed   By: Margaree Mackintosh M.D.   On: 04/25/2014 17:33     EKG Interpretation   Date/Time:  Tuesday April 25 2014 18:32:10 EDT Ventricular Rate:  115 PR Interval:  135 QRS Duration: 123 QT Interval:  338 QTC Calculation: 467 R Axis:   80 Text Interpretation:  Sinus tachycardia Right bundle branch block ST depr,  consider ischemia, inferior leads No significant change since last tracing  Confirmed by Zenia Resides  MD, ANTHONY ([id number]) on 04/25/2014 9:36:22 PM      MDM   Final diagnoses:  Post-operative wound abscess  Delirium due to medical condition with behavioral disturbance   Patient with increasing combative behavior, low-grade fever and purulent drainage from left-sided hip replacement worsening over the course of the last 4 weeks. Ortho consult from Dr. Percell Miller appreciated will washout in AM.   05:14 PM Informed by attending physician Dr. Zenia Resides he has spoken with orthopedic surgeon Dr. Percell Miller who requests that antibiotics not be initiated today. Would like to take her to the operating room for washout in the morning and will initiate antibiotics at that time. I have discussed this with attending physician Dr. Zenia Resides expressed my concerns about the laying antibiotics. We have decided to respect the specialist wishes. Patient's with stable vital signs, technically not septic at this time.   Monico Blitz, PA-C 04/25/14 1715  Patient found to be significantly anemic with an H&H of 6.50/20.1. Patient has dropped to 2.5 points in the last 20 days. Type and screen and one PRBC unit ordered. M.D. Aware.    07:51 PM discussed EKG with attending.   Monico Blitz, PA-C 04/25/14 1952  08:03 PM  FOBT obtained  Normal stool color, Asked attending to evaluate EKG for 2nd time. Page out to Dr. Percell Miller to update on anemia and discuss Abx.   Monico Blitz, PA-C 04/25/14 2006  08:08 Discussed case  with Dr. Percell Miller. Apprised of the anemia. Recommends transfusing 2 units, will still take to OR in the a.m. Also recommends proceeding forward with no antibiotics until patient taken to the ED. M.D. aware.  Monico Blitz, PA-C 04/25/14 2009  Considering anemia, leukocytosis, altered mental status I think it is reasonable to initiate antibiotics at this time. I have discussed this with admitting physician Dr. Ernestina Patches who agrees with care plan. Orders to consult from Dr. Mardelle Matte appreciated. Apprised of  treatment today. He has seen and evaluated the patient and will consult.  Monico Blitz, PA-C 04/25/14 2302

## 2014-04-25 NOTE — ED Notes (Signed)
Spoke with Dr. Mardelle Matte ref. IV blood transfusion and IV antibiotics and pt has one IV access.  Dr. Mardelle Matte advises to give IV antibiotics then blood.

## 2014-04-25 NOTE — ED Notes (Signed)
EMS - Concern for infection in the left hip.  Pt left hip has reddness, warm and possible drainage around dressing.  Pt is lethargic per the families normal state.

## 2014-04-25 NOTE — Progress Notes (Signed)
Unit CM UR Completed by [facility] ED CM  W. Craig Ionescu RN  

## 2014-04-25 NOTE — Progress Notes (Signed)
ANTIBIOTIC CONSULT NOTE - INITIAL  Pharmacy Consult for Vancomycin, Cefepime  Indication: Sepsis   No Known Allergies  Patient Measurements: Height: 5' 4.17" (163 cm) Weight: 130 lb 15.3 oz (59.4 kg) IBW/kg (Calculated) : 55.1 Adjusted Body Weight: n/a   Vital Signs: Temp: 100.2 F (37.9 C) (04/28 1510) Temp src: Rectal (04/28 1510) BP: 142/75 mmHg (04/28 1413) Pulse Rate: 96 (04/28 1413) Intake/Output from previous day:   Intake/Output from this shift:    Labs:  Recent Labs  04/25/14 1543  WBC 15.5*  HGB 6.5*  PLT 402*  CREATININE 0.76   Estimated Creatinine Clearance: 42.3 ml/min (by C-G formula based on Cr of 0.76). No results found for this basename: VANCOTROUGH, VANCOPEAK, VANCORANDOM, GENTTROUGH, GENTPEAK, GENTRANDOM, TOBRATROUGH, TOBRAPEAK, TOBRARND, AMIKACINPEAK, AMIKACINTROU, AMIKACIN,  in the last 72 hours   Microbiology: Recent Results (from the past 720 hour(s))  CULTURE, BLOOD (ROUTINE X 2)     Status: None   Collection Time    04/03/14  5:42 AM      Result Value Ref Range Status   Specimen Description BLOOD RIGHT UPPER ARM IV START   Final   Special Requests BOTTLES DRAWN AEROBIC AND ANAEROBIC 10CC EA   Final   Culture  Setup Time     Final   Value: 04/03/2014 08:40     Performed at [facility]   Culture     Final   Value: NO GROWTH 5 DAYS     Performed at [facility]   Report Status 04/09/2014 FINAL   Final  CULTURE, BLOOD (ROUTINE X 2)     Status: None   Collection Time    04/03/14  8:10 AM      Result Value Ref Range Status   Specimen Description BLOOD RIGHT HAND   Final   Special Requests     Final   Value: BOTTLES DRAWN AEROBIC AND ANAEROBIC 10CC BLUE 5CC RED   Culture  Setup Time     Final   Value: 04/03/2014 12:26     Performed at [facility]   Culture     Final   Value: NO GROWTH 5 DAYS     Performed at [facility]   Report Status 04/09/2014 FINAL   Final  WOUND CULTURE     Status: None   Collection Time    04/04/14 12:26 PM      Result Value Ref Range Status   Specimen Description WOUND HIP LEFT   Final   Special Requests Normal   Final   Gram Stain     Final   Value: MODERATE WBC PRESENT, PREDOMINANTLY PMN     RARE SQUAMOUS EPITHELIAL CELLS PRESENT     NO ORGANISMS SEEN     Performed at [facility]   Culture     Final   Value: FEW PSEUDOMONAS AERUGINOSA     Performed at [facility]   Report Status 04/07/2014 FINAL   Final   Organism ID, Bacteria PSEUDOMONAS AERUGINOSA   Final    Medical History: Past Medical History  Diagnosis Date  . Arthritis   . Gout   . Osteoarthritis   . Diabetes mellitus without complication   . Hypertension   . Dementia   . Cancer   . Anemia   . GERD (gastroesophageal reflux disease)     Medications:   (Not in a hospital admission) Assessment: 31 YOF s/p left hip replacement 3 weeks ago was re-admitted with postop wound infection  and discharged 2 weeks ago. She now returns to the hospital with purulent drainage from the surgical site. Pharmacy to start empiric antibiotic therapy with Vancomycin and Cefepime. Plan for OR in the AM.  Cultures: 4/28: Urine Cx 4/28:  Blood Cx x2>>  4/28 Wound Cx >>   Goal of Therapy:  Vancomycin trough level 15-20 mcg/ml  Plan:  1) Vancomycin 500 mg IV Q 12 hours  2) Cefepime 1 gm IV Q 24 hours  3) Monitor CBC, renal fx, cultures and patient's clinical progress 4) Collect Vancomycin trough as indicated    Albertina Parr, PharmD.  Clinical Pharmacist Pager [phone]

## 2014-04-25 NOTE — ED Notes (Signed)
Phlebotomy unable to get labs at this time.

## 2014-04-25 NOTE — ED Notes (Signed)
Received call from IV team st's they will attempt IV access.

## 2014-04-25 NOTE — Consult Note (Signed)
ORTHOPAEDIC CONSULTATION  REQUESTING PHYSICIAN: Leota Jacobsen, MD  Chief Complaint: Left hip infection  HPI: Michelle Yoder is a 80 y.o. female who was brought in from the nursing home today with severe drainage of the left hip, altered mental status, progressive deterioration in function. She broke her hip 6 weeks ago, and had a hemiarthroplasty performed. She apparently has had a draining fluctuant wound for the past 4 weeks, and was readmitted recently for dehydration, and stayed in the hospital for about a week, and then was discharged to the nursing home. Orthopedic consultation was requested du to her severe sepsis, and question about management of her hip. She is not mentally capable of complaining of pain, and apparently she has been more combative since the surgery on her hip. Before she broke her hip she was able to stand and take a couple of steps, but she preferred using a wheelchair, and was a minimal ambulator.   She has a past history of rheumatoid arthritis, on methotrexate, until she developed liver dysfunction, and the methotrexate discontinued.   Past Medical History  Diagnosis Date  . Arthritis   . Gout   . Osteoarthritis   . Diabetes mellitus without complication   . Hypertension   . Dementia   . Cancer   . Anemia   . GERD (gastroesophageal reflux disease)    Past Surgical History  Procedure Laterality Date  . Mastectomy    . Abdominal hysterectomy    . Hip arthroplasty Left 03/09/2014    Procedure: ARTHROPLASTY BIPOLAR HIP;  Surgeon: [name], MD;  Location: [place];  Service: Orthopedics;  Laterality: Left;   History   Social History  . Marital Status: Widowed    Spouse Name: N/A    Number of Children: N/A  . Years of Education: N/A   Social History Main Topics  . Smoking status: Former Research scientist (life sciences)  . Smokeless tobacco: None  . Alcohol Use: No  . Drug Use: No  . Sexual Activity: None   Other Topics Concern  . None   Social History Narrative   . None   No family history on file. her daughter denies rheumatoid arthritis, and her husband died in 02/18/23.  No Known Allergies Prior to Admission medications   Medication Sig Start Date End Date Taking? Authorizing Provider  aspirin 325 MG tablet Take 325 mg by mouth 2 (two) times daily.   Yes Historical Provider, MD  aspirin EC 81 MG tablet Take 1 tablet (81 mg total) by mouth daily. 04/14/14  Yes Barton Dubois, MD  B Complex Vitamins (VITAMIN B COMPLEX PO) Take 1 capsule by mouth daily.   Yes Historical Provider, MD  cephALEXin (KEFLEX) 500 MG capsule Take 500 mg by mouth once.   Yes Historical Provider, MD  Cholecalciferol (VITAMIN D-3) 1000 UNITS CAPS Take 1 capsule by mouth daily.   Yes Historical Provider, MD  citalopram (CELEXA) 10 MG tablet Take 10 mg by mouth daily.   Yes Historical Provider, MD  docusate sodium (COLACE) 100 MG capsule Take 1 capsule (100 mg total) by mouth 2 (two) times daily. 03/09/14  Yes M. Doran Stabler, PA-C  feeding supplement, GLUCERNA SHAKE, (GLUCERNA SHAKE) LIQD Take 237 mLs by mouth 2 (two) times daily between meals. 04/06/14  Yes Barton Dubois, MD  ferrous sulfate 325 (65 FE) MG tablet Take 325 mg by mouth daily with breakfast.   Yes Historical Provider, MD  HYDROcodone-acetaminophen (NORCO) 5-325 MG per tablet Take 1 tablet by mouth every 6 (  six) hours as needed for moderate pain. 04/06/14  Yes Carlos Madera, MD  leflunomide (ARAVA) 20 MG tablet Take 20 mg by mouth daily.   Yes Historical Provider, MD  lidocaine (LIDODERM) 5 % Place 1 patch onto the skin daily. Remove & Discard patch within 12 hours or as directed by MD   Yes Historical Provider, MD  LORazepam (ATIVAN) 0.5 MG tablet Take 0.5 mg by mouth every 12 (twelve) hours as needed for anxiety.   Yes Historical Provider, MD  Memantine HCl ER 7 MG CP24 Take 1 capsule by mouth daily with breakfast.   Yes Historical Provider, MD  metoprolol tartrate (LOPRESSOR) 25 MG tablet Take 12.5 mg by mouth 2 (two)  times daily.   Yes Historical Provider, MD  Multiple Vitamins-Minerals (THERADEX M PO) Take 1 tablet by mouth daily.   Yes Historical Provider, MD  omeprazole (PRILOSEC) 20 MG capsule Take 20 mg by mouth daily.   Yes Historical Provider, MD  predniSONE (DELTASONE) 2.5 MG tablet Take 2.5-5 mg by mouth 2 (two) times daily. Give 2 tablets (5 mg) in the morning and 1 tablet (2.5 mg) in the evening   Yes Historical Provider, MD  risperiDONE (RISPERDAL) 0.25 MG tablet Take 1 tablet (0.25 mg total) by mouth at bedtime. 04/06/14  Yes Carlos Madera, MD  simethicone (MI-ACID GAS RELIEF) 80 MG chewable tablet Chew 80 mg by mouth 2 (two) times daily.   Yes Historical Provider, MD  sitaGLIPtin (JANUVIA) 25 MG tablet Take 1 tablet (25 mg total) by mouth daily. 04/06/14  Yes Carlos Madera, MD  vitamin C (ASCORBIC ACID) 500 MG tablet Take 500 mg by mouth daily.   Yes Historical Provider, MD  magnesium oxide (MAGOX 400) 400 (241.3 MG) MG tablet Take 1 tablet (400 mg total) by mouth daily. 04/06/14   Carlos Madera, MD   Dg Chest 1 View  04/25/2014   CLINICAL DATA:  infection  EXAM: CHEST - 1 VIEW  COMPARISON:  DG CHEST 1V PORT dated 04/03/2014  FINDINGS: The heart size and mediastinal contours are within normal limits. Both lungs are clear. Degenerative changes are appreciated within the left shoulder.  IMPRESSION: No active disease.   Electronically Signed   By: Hector  Cooper M.D.   On: 04/25/2014 17:34   Dg Hip Complete Left  04/25/2014   CLINICAL DATA:  infection, pain  EXAM: LEFT HIP - COMPLETE 2+ VIEW  COMPARISON:  DG HIP PORTABLE 1 VIEW*L* dated 03/09/2014; DG HIP COMPLETE*L* dated 03/08/2014  FINDINGS: There is no evidence of hip fracture or dislocation. There is no evidence of arthropathy or other focal bone abnormality. The patient's total left hip arthroplasty is intact without evidence of loosening or failure. There is no evidence of subcutaneous emphysema nor evidence of cortical irregularity.  IMPRESSION: Negative.    Electronically Signed   By: Hector  Cooper M.D.   On: 04/25/2014 17:33    Positive ROS: All other systems have been reviewed and were otherwise negative with the exception of those mentioned in the HPI and as above.  Physical Exam: General:  she is lying on a gurney, in mild distress, although she appears comfortable, but is moderately combative at times. She does appear fairly pale.  Cardiovascular:  she has mild to moderate bilateral pedal edema  Respiratory: No cyanosis, no use of accessory musculature GI: No organomegaly, abdomen is soft and non-tender Skin: she has a surgical wound over the left hip that has serosanguineous drainage that is somewhat purulent.  Neurologic: Sensation   intact distally Psychiatric: Patient is  not competent for consent, but her daughter is at the bedside.  Lymphatic: No axillary or cervical lymphadenopathy  MUSCULOSKELETAL: She will move her toes with a flicker of EHL and FHL, she will follow commands with difficulty and reluctance.   Assessment: Infected left hip hemiarthroplasty, with some systemic signs of sepsis, with a temperature of 101.2 by rectal testing, and she is currently tachycardic at 126, but has maintained her blood pressure in the 140s.   she also has severe anemia, unclear etiology, but likely from the postsurgical ongoing drainage for the past month.   Plan: She is being admitted to the medical service, and given IV antibiotics due to her impending sepsis, she's been received blood, which will have to be optimized and monitored due to the fact that she may have some element of heart failure as well. I had a long discussion with the daughter regarding the risks benefits and alternatives to surgical intervention, and she has elected for her mother to undergo surgical irrigation and debridement, with probable resection hip arthroplasty/[place], with removal of the prosthesis. Given the fact that her infection has been present for at least  the past month, I do not think that retention of the prosthesis is possible given her coexisting risk factors, and I doubt that she will be able to survive multiple operations, which I think would be reamed needed in order to reimplant a prosthesis. Additionally, she was not much of an ambulator to begin with, and so I think that surgical resection would be the safest the most appropriate course of action.  Having said that she will need to be optimized prior to proceeding, and I'm not sure whether or not she can get enough blood transfusion to have her optimized, in the setting of her congestive heart failure, prior to tomorrow. Having said that, we will reserve her room for surgical intervention tomorrow if she is optimized, and Dr. Timothy Murphy will be checking on her tomorrow morning to make final decisions and to coordinate with the internal medicine team.  This is a severe life-threatening condition, and she certainly may not survive this complication. I've discussed this in detail with the daughter.   Michelle Mirkin P Sherley Leser, MD Cell [phone]   04/25/2014 9:46 PM    

## 2014-04-25 NOTE — ED Provider Notes (Addendum)
Medical screening examination/treatment/procedure(s) were conducted as a shared visit with non-physician practitioner(s) and myself.  I personally evaluated the patient during the encounter.   EKG Interpretation None     Patient here with purulent drainage from her prior left hip surgery.. She is fertile here. Likely with post operative infection. Patient be started on antibiotics and admitted to medicine.  Michelle Jacobsen, MD 04/25/14 1657  Michelle Jacobsen, MD 04/27/14 [phone]

## 2014-04-26 ENCOUNTER — Inpatient Hospital Stay (HOSPITAL_COMMUNITY): Payer: Medicare Other | Admitting: Certified Registered"

## 2014-04-26 ENCOUNTER — Inpatient Hospital Stay (HOSPITAL_COMMUNITY): Payer: Medicare Other

## 2014-04-26 ENCOUNTER — Encounter (HOSPITAL_COMMUNITY)
Admission: EM | Disposition: A | Discharge status: Nursing Home (Includes ICF) | Payer: Self-pay | Source: Home / Self Care | Attending: Internal Medicine

## 2014-04-26 ENCOUNTER — Encounter (HOSPITAL_COMMUNITY): Payer: Medicare Other | Admitting: Certified Registered"

## 2014-04-26 ENCOUNTER — Encounter (HOSPITAL_COMMUNITY): Payer: Self-pay | Admitting: Certified Registered Nurse Anesthetist

## 2014-04-26 DIAGNOSIS — T8140XA Infection following a procedure, unspecified, initial encounter: Secondary | ICD-10-CM | POA: Diagnosis present

## 2014-04-26 DIAGNOSIS — A419 Sepsis, unspecified organism: Secondary | ICD-10-CM

## 2014-04-26 DIAGNOSIS — Y834 Other reconstructive surgery as the cause of abnormal reaction of the patient, or of later complication, without mention of misadventure at the time of the procedure: Secondary | ICD-10-CM

## 2014-04-26 DIAGNOSIS — F0390 Unspecified dementia without behavioral disturbance: Secondary | ICD-10-CM

## 2014-04-26 DIAGNOSIS — T8450XA Infection and inflammatory reaction due to unspecified internal joint prosthesis, initial encounter: Principal | ICD-10-CM

## 2014-04-26 DIAGNOSIS — Z96649 Presence of unspecified artificial hip joint: Secondary | ICD-10-CM

## 2014-04-26 HISTORY — PX: HARDWARE REMOVAL: SHX979

## 2014-04-26 HISTORY — PX: INCISION AND DRAINAGE HIP: SHX1801

## 2014-04-26 LAB — GLUCOSE, CAPILLARY
GLUCOSE-CAPILLARY: 112 mg/dL — AB (ref 70–99)
GLUCOSE-CAPILLARY: 90 mg/dL (ref 70–99)
GLUCOSE-CAPILLARY: 98 mg/dL (ref 70–99)
GLUCOSE-CAPILLARY: 100 mg/dL — AB (ref 70–99)
Glucose-Capillary: 111 mg/dL — ABNORMAL HIGH (ref 70–99)
Glucose-Capillary: 107 mg/dL — ABNORMAL HIGH (ref 70–99)
Glucose-Capillary: 72 mg/dL (ref 70–99)

## 2014-04-26 LAB — COMPREHENSIVE METABOLIC PANEL
ALBUMIN: 1.4 g/dL — AB (ref 3.5–5.2)
ALK PHOS: 173 U/L — AB (ref 39–117)
ALT: 21 U/L (ref 0–35)
AST: 26 U/L (ref 0–37)
BILIRUBIN TOTAL: 0.6 mg/dL (ref 0.3–1.2)
BUN: 23 mg/dL (ref 6–23)
CO2: 22 mEq/L (ref 19–32)
Calcium: 8.5 mg/dL (ref 8.4–10.5)
Chloride: 111 mEq/L (ref 96–112)
Creatinine, Ser: 0.81 mg/dL (ref 0.50–1.10)
GFR calc Af Amer: 73 mL/min — ABNORMAL LOW (ref >90)
GFR calc non Af Amer: 63 mL/min — ABNORMAL LOW (ref >90)
Glucose, Bld: 114 mg/dL — ABNORMAL HIGH (ref 70–99)
POTASSIUM: 3.6 meq/L — AB (ref 3.7–5.3)
SODIUM: 142 meq/L (ref 137–147)
Total Protein: 4.5 g/dL — ABNORMAL LOW (ref 6.0–8.3)

## 2014-04-26 LAB — CBC
HEMATOCRIT: 24.1 % — AB (ref 36.0–46.0)
HEMOGLOBIN: 7.8 g/dL — AB (ref 12.0–15.0)
MCH: 30.4 pg (ref 26.0–34.0)
MCHC: 32.4 g/dL (ref 30.0–36.0)
MCV: 93.8 fL (ref 78.0–100.0)
Platelets: 244 10*3/uL (ref 150–400)
RBC: 2.57 MIL/uL — AB (ref 3.87–5.11)
RDW: 19 % — ABNORMAL HIGH (ref 11.5–15.5)
WBC: 9.9 10*3/uL (ref 4.0–10.5)

## 2014-04-26 LAB — URINE CULTURE
COLONY COUNT: NO GROWTH
Culture: NO GROWTH
SPECIAL REQUESTS: NORMAL

## 2014-04-26 LAB — HEMOGLOBIN AND HEMATOCRIT, BLOOD
HCT: 44.2 % (ref 36.0–46.0)
HEMOGLOBIN: 15 g/dL (ref 12.0–15.0)

## 2014-04-26 LAB — MRSA PCR SCREENING: MRSA BY PCR: NEGATIVE

## 2014-04-26 LAB — BASIC METABOLIC PANEL
BUN: 23 mg/dL (ref 6–23)
CHLORIDE: 113 meq/L — AB (ref 96–112)
CO2: 20 meq/L (ref 19–32)
Calcium: 8.7 mg/dL (ref 8.4–10.5)
Creatinine, Ser: 0.73 mg/dL (ref 0.50–1.10)
GFR calc Af Amer: 86 mL/min — ABNORMAL LOW (ref >90)
GFR calc non Af Amer: 74 mL/min — ABNORMAL LOW (ref >90)
Glucose, Bld: 86 mg/dL (ref 70–99)
POTASSIUM: 4 meq/L (ref 3.7–5.3)
SODIUM: 143 meq/L (ref 137–147)

## 2014-04-26 SURGERY — REMOVAL, HARDWARE
Anesthesia: General | Site: Hip | Laterality: Left

## 2014-04-26 MED ORDER — NEOSTIGMINE METHYLSULFATE 1 MG/ML IJ SOLN
INTRAMUSCULAR | Status: DC | PRN
  Administered 2014-04-26: 3 mg via INTRAVENOUS

## 2014-04-26 MED ORDER — VANCOMYCIN HCL 500 MG IV SOLR
INTRAVENOUS | Status: DC | PRN
  Administered 2014-04-26: 500 mg

## 2014-04-26 MED ORDER — ARTIFICIAL TEARS OP OINT
TOPICAL_OINTMENT | OPHTHALMIC | Status: DC | PRN
  Administered 2014-04-26: 1 via OPHTHALMIC

## 2014-04-26 MED ORDER — IBUPROFEN 800 MG/8ML IV SOLN
800.0000 mg | Freq: Once | INTRAVENOUS | Status: AC
  Administered 2014-04-26: 800 mg via INTRAVENOUS
  Filled 2014-04-26: qty 8

## 2014-04-26 MED ORDER — PROPOFOL 10 MG/ML IV BOLUS
INTRAVENOUS | Status: DC | PRN
  Administered 2014-04-26: 30 mg via INTRAVENOUS

## 2014-04-26 MED ORDER — GLYCOPYRROLATE 0.2 MG/ML IJ SOLN
INTRAMUSCULAR | Status: DC | PRN
  Administered 2014-04-26: 0.4 mg via INTRAVENOUS

## 2014-04-26 MED ORDER — LACTATED RINGERS IV SOLN
INTRAVENOUS | Status: DC | PRN
  Administered 2014-04-26: 08:00:00 via INTRAVENOUS

## 2014-04-26 MED ORDER — LIDOCAINE HCL (CARDIAC) 20 MG/ML IV SOLN
INTRAVENOUS | Status: DC | PRN
  Administered 2014-04-26: 40 mg via INTRAVENOUS
  Administered 2014-04-26: 20 mg via INTRAVENOUS

## 2014-04-26 MED ORDER — ACETAMINOPHEN 650 MG RE SUPP
650.0000 mg | RECTAL | Status: DC | PRN
  Administered 2014-04-27: 650 mg via RECTAL
  Filled 2014-04-26: qty 1

## 2014-04-26 MED ORDER — ROCURONIUM BROMIDE 100 MG/10ML IV SOLN
INTRAVENOUS | Status: DC | PRN
  Administered 2014-04-26: 30 mg via INTRAVENOUS

## 2014-04-26 MED ORDER — HYDROCODONE-ACETAMINOPHEN 5-325 MG PO TABS: 1.0000 | ORAL_TABLET | Freq: Four times a day (QID) | ORAL | Status: DC | PRN

## 2014-04-26 MED ORDER — MENTHOL 3 MG MT LOZG
1.0000 | LOZENGE | OROMUCOSAL | Status: DC | PRN
  Filled 2014-04-26: qty 9

## 2014-04-26 MED ORDER — SODIUM CHLORIDE 0.9 % IR SOLN
Status: DC | PRN
  Administered 2014-04-26 (×2): 3000 mL

## 2014-04-26 MED ORDER — CEFAZOLIN SODIUM-DEXTROSE 2-3 GM-% IV SOLR: 2.0000 g | Freq: Four times a day (QID) | INTRAVENOUS | Status: DC

## 2014-04-26 MED ORDER — GENTAMICIN SULFATE 40 MG/ML IJ SOLN
INTRAMUSCULAR | Status: DC | PRN
  Administered 2014-04-26: 80 mg

## 2014-04-26 MED ORDER — FENTANYL CITRATE 0.05 MG/ML IJ SOLN
INTRAMUSCULAR | Status: DC | PRN
  Administered 2014-04-26: 75 ug via INTRAVENOUS
  Administered 2014-04-26: 25 ug via INTRAVENOUS

## 2014-04-26 MED ORDER — ARTIFICIAL TEARS OP OINT
TOPICAL_OINTMENT | OPHTHALMIC | Status: AC
  Filled 2014-04-26: qty 3.5

## 2014-04-26 MED ORDER — PHENOL 1.4 % MT LIQD: 1.0000 | OROMUCOSAL | Status: DC | PRN

## 2014-04-26 MED ORDER — VANCOMYCIN HCL 500 MG IV SOLR
INTRAVENOUS | Status: AC
  Filled 2014-04-26: qty 500

## 2014-04-26 MED ORDER — DEXTROSE 5 % IV SOLN
1.0000 g | Freq: Two times a day (BID) | INTRAVENOUS | Status: DC
  Administered 2014-04-26 – 2014-04-27 (×3): 1 g via INTRAVENOUS
  Filled 2014-04-26 (×4): qty 1

## 2014-04-26 MED ORDER — PROPOFOL 10 MG/ML IV BOLUS
INTRAVENOUS | Status: AC
  Filled 2014-04-26: qty 20

## 2014-04-26 MED ORDER — GENTAMICIN SULFATE 40 MG/ML IJ SOLN
INTRAMUSCULAR | Status: AC
  Filled 2014-04-26: qty 2

## 2014-04-26 MED ORDER — ROCURONIUM BROMIDE 50 MG/5ML IV SOLN
INTRAVENOUS | Status: AC
  Filled 2014-04-26: qty 1

## 2014-04-26 MED ORDER — GLYCOPYRROLATE 0.2 MG/ML IJ SOLN
INTRAMUSCULAR | Status: AC
  Filled 2014-04-26: qty 2

## 2014-04-26 MED ORDER — SODIUM CHLORIDE 0.9 % IV SOLN
INTRAVENOUS | Status: DC | PRN
  Administered 2014-04-26: 09:00:00 via INTRAVENOUS

## 2014-04-26 MED ORDER — ONDANSETRON HCL 4 MG/2ML IJ SOLN
INTRAMUSCULAR | Status: AC
  Filled 2014-04-26: qty 2

## 2014-04-26 MED ORDER — FENTANYL CITRATE 0.05 MG/ML IJ SOLN: 25.0000 ug | INTRAMUSCULAR | Status: DC | PRN

## 2014-04-26 MED ORDER — ONDANSETRON HCL 4 MG/2ML IJ SOLN
4.0000 mg | Freq: Once | INTRAMUSCULAR | Status: DC | PRN
Start: 2014-04-26 — End: 2014-04-26

## 2014-04-26 MED ORDER — ASPIRIN EC 325 MG PO TBEC
325.0000 mg | DELAYED_RELEASE_TABLET | Freq: Every day | ORAL | Status: DC
  Administered 2014-04-27 – 2014-04-28 (×2): 325 mg via ORAL
  Filled 2014-04-26 (×4): qty 1

## 2014-04-26 MED ORDER — NEOSTIGMINE METHYLSULFATE 1 MG/ML IJ SOLN
INTRAMUSCULAR | Status: AC
  Filled 2014-04-26: qty 10

## 2014-04-26 MED ORDER — FENTANYL CITRATE 0.05 MG/ML IJ SOLN
INTRAMUSCULAR | Status: AC
  Filled 2014-04-26: qty 5

## 2014-04-26 MED ORDER — MORPHINE SULFATE 2 MG/ML IJ SOLN
1.0000 mg | INTRAMUSCULAR | Status: DC | PRN
  Administered 2014-04-26 – 2014-04-28 (×5): 1 mg via INTRAVENOUS
  Filled 2014-04-26 (×5): qty 1

## 2014-04-26 MED ORDER — ONDANSETRON HCL 4 MG/2ML IJ SOLN
INTRAMUSCULAR | Status: DC | PRN
  Administered 2014-04-26: 4 mg via INTRAVENOUS

## 2014-04-26 MED ORDER — SODIUM CHLORIDE 0.9 % IV BOLUS (SEPSIS)
1000.0000 mL | Freq: Once | INTRAVENOUS | Status: AC
  Administered 2014-04-26: 1000 mL via INTRAVENOUS

## 2014-04-26 SURGICAL SUPPLY — 68 items
20" UNIV BASIC KNEE SPLINT ×4 IMPLANT
BANDAGE ELASTIC 4 VELCRO ST LF (GAUZE/BANDAGES/DRESSINGS) IMPLANT
BANDAGE ELASTIC 6 VELCRO ST LF (GAUZE/BANDAGES/DRESSINGS) IMPLANT
BANDAGE GAUZE ELAST BULKY 4 IN (GAUZE/BANDAGES/DRESSINGS) IMPLANT
BLADE 10 SAFETY STRL DISP (BLADE) IMPLANT
BLADE SURG 10 STRL SS (BLADE) ×4 IMPLANT
BNDG COHESIVE 4X5 TAN STRL (GAUZE/BANDAGES/DRESSINGS) IMPLANT
BNDG COHESIVE 6X5 TAN STRL LF (GAUZE/BANDAGES/DRESSINGS) ×4 IMPLANT
BOOTCOVER CLEANROOM LRG (PROTECTIVE WEAR) IMPLANT
CONT SPEC 4OZ CLIKSEAL STRL BL (MISCELLANEOUS) ×4 IMPLANT
COVER MAYO STAND STRL (DRAPES) ×4 IMPLANT
COVER SURGICAL LIGHT HANDLE (MISCELLANEOUS) ×4 IMPLANT
COVER TABLE BACK 60X90 (DRAPES) ×4 IMPLANT
CUFF TOURNIQUET SINGLE 34IN LL (TOURNIQUET CUFF) IMPLANT
DRAPE INCISE IOBAN 66X45 STRL (DRAPES) ×4 IMPLANT
DRAPE ORTHO SPLIT 77X108 STRL (DRAPES) ×4
DRAPE SURG ORHT 6 SPLT 77X108 (DRAPES) ×4 IMPLANT
DRSG MEPITEL 4X7.2 (GAUZE/BANDAGES/DRESSINGS) ×4 IMPLANT
DRSG PAD ABDOMINAL 8X10 ST (GAUZE/BANDAGES/DRESSINGS) ×4 IMPLANT
DURAPREP 26ML APPLICATOR (WOUND CARE) ×4 IMPLANT
ELECT REM PT RETURN 9FT ADLT (ELECTROSURGICAL)
ELECTRODE REM PT RTRN 9FT ADLT (ELECTROSURGICAL) IMPLANT
EVACUATOR 1/8 PVC DRAIN (DRAIN) ×4 IMPLANT
FACESHIELD WRAPAROUND (MASK) IMPLANT
GAUZE XEROFORM 1X8 LF (GAUZE/BANDAGES/DRESSINGS) IMPLANT
GAUZE XEROFORM 5X9 LF (GAUZE/BANDAGES/DRESSINGS) IMPLANT
GLOVE BIO SURGEON STRL SZ 6.5 (GLOVE) ×3 IMPLANT
GLOVE BIO SURGEON STRL SZ7 (GLOVE) ×4 IMPLANT
GLOVE BIO SURGEON STRL SZ8 (GLOVE) ×8 IMPLANT
GLOVE BIO SURGEONS STRL SZ 6.5 (GLOVE) ×1
GLOVE BIOGEL PI IND STRL 6.5 (GLOVE) ×2 IMPLANT
GLOVE BIOGEL PI IND STRL 7.0 (GLOVE) ×6 IMPLANT
GLOVE BIOGEL PI IND STRL 8 (GLOVE) ×4 IMPLANT
GLOVE BIOGEL PI INDICATOR 6.5 (GLOVE) ×2
GLOVE BIOGEL PI INDICATOR 7.0 (GLOVE) ×6
GLOVE BIOGEL PI INDICATOR 8 (GLOVE) ×4
GLOVE ORTHO TXT STRL SZ7.5 (GLOVE) ×8 IMPLANT
GOWN STRL REUS W/ TWL LRG LVL3 (GOWN DISPOSABLE) ×4 IMPLANT
GOWN STRL REUS W/TWL 2XL LVL3 (GOWN DISPOSABLE) ×4 IMPLANT
GOWN STRL REUS W/TWL LRG LVL3 (GOWN DISPOSABLE) ×4
HANDPIECE INTERPULSE COAX TIP (DISPOSABLE)
KIT BASIN OR (CUSTOM PROCEDURE TRAY) ×4 IMPLANT
KIT ROOM TURNOVER OR (KITS) ×4 IMPLANT
KIT STIMULAN RAPID CURE  10CC (Orthopedic Implant) ×2 IMPLANT
KIT STIMULAN RAPID CURE 10CC (Orthopedic Implant) ×2 IMPLANT
MANIFOLD NEPTUNE II (INSTRUMENTS) ×4 IMPLANT
NS IRRIG 1000ML POUR BTL (IV SOLUTION) ×4 IMPLANT
PACK ORTHO EXTREMITY (CUSTOM PROCEDURE TRAY) ×4 IMPLANT
PAD ARMBOARD 7.5X6 YLW CONV (MISCELLANEOUS) ×8 IMPLANT
PENCIL BUTTON HOLSTER BLD 10FT (ELECTRODE) IMPLANT
RUBBERBAND STERILE (MISCELLANEOUS) ×4 IMPLANT
SET HNDPC FAN SPRY TIP SCT (DISPOSABLE) IMPLANT
SPONGE GAUZE 4X4 12PLY (GAUZE/BANDAGES/DRESSINGS) ×8 IMPLANT
SPONGE LAP 18X18 X RAY DECT (DISPOSABLE) ×4 IMPLANT
STOCKINETTE IMPERVIOUS 9X36 MD (GAUZE/BANDAGES/DRESSINGS) ×4 IMPLANT
SUCTION FRAZIER TIP 10 FR DISP (SUCTIONS) ×4 IMPLANT
SUT ETHILON 3 0 PS 1 (SUTURE) ×8 IMPLANT
SUT PDS AB 0 CT 36 (SUTURE) ×8 IMPLANT
SWAB COLLECTION DEVICE MRSA (MISCELLANEOUS) ×4 IMPLANT
TOWEL OR 17X24 6PK STRL BLUE (TOWEL DISPOSABLE) ×4 IMPLANT
TOWEL OR 17X26 10 PK STRL BLUE (TOWEL DISPOSABLE) ×4 IMPLANT
TOWEL OR NON WOVEN STRL DISP B (DISPOSABLE) ×4 IMPLANT
TUBE ANAEROBIC SPECIMEN COL (MISCELLANEOUS) ×4 IMPLANT
TUBE CONNECTING 12'X1/4 (SUCTIONS) ×2
TUBE CONNECTING 12X1/4 (SUCTIONS) ×6 IMPLANT
UNDERPAD 30X30 INCONTINENT (UNDERPADS AND DIAPERS) IMPLANT
WATER STERILE IRR 1000ML POUR (IV SOLUTION) ×4 IMPLANT
YANKAUER SUCT BULB TIP NO VENT (SUCTIONS) ×4 IMPLANT

## 2014-04-26 NOTE — Consult Note (Signed)
PULMONARY / CRITICAL CARE MEDICINE   Name: Michelle Yoder MRN: [id number] DOB: 12/30/1922    ADMISSION DATE:  04/25/2014 CONSULTATION DATE:  04/26/2014  REFERRING MD :  Ernestina Patches PRIMARY SERVICE: Family  CHIEF COMPLAINT:  Fever  BRIEF PATIENT DESCRIPTION: 80 y.o. F s/p left hip hemiarthroplasty 03/15, has had persistent drainage from surgical site x 4 weeks.  Pt brought in for altered mental status and deterioration in function, found to be septic.  Admitted by family medicine.  Developed fever and tachycardia 4/29, PCCM consulted.  SIGNIFICANT EVENTS / STUDIES:  3/15 left hip hemiarthroplasty 4/28 admitted 4/29 developed fever (102.2 max), tachycardia - PCCM consulted.  LINES / TUBES: Left IJ TLC 4/29 >>>  CULTURES: Urine 4/28 >>> Blood 4/28 >>> Wound (left hip) 4/28 >>>  ANTIBIOTICS: Vanc 4/28 >>> Cefepime 4/28 >>>   HISTORY OF PRESENT ILLNESS:  Pt is altered therefore this HPI is obtained from chart review. Michelle Yoder is a 80 y.o. F who was brought in from [facility] 4/28 with significant drainage from the left hip surgical site, altered mental status, and progressive deterioration in function.  She suffered a hip fx 6 weeks ago and underwent hemiarthroplasty.  Per chart review, left hip began to drain roughly 4 weeks ago and has continued to do so.  Per pts daughter, since surgery, pt has had reucrrent episodes of fever/confusion.  Of note, pt was recently admitted for dehydration and left hip wound culture grew Pseudomonas (pan sensitive).  PT was discharged with 10 day course of cipro which she completed. Pt developed fever 24 hours ago and per orthopedics, was instructed to come to the ED.  In ED, frank pus was noted to be draining from wound site.  Pt was seen by orthopedics, they are considering taking her to the OR for debridement in AM 4/29. Since admission, pt has had increase in fever (Tmax 102.2) and is tachycardic to 116.  Family medicine was concerned for deterioration, PCCM  consulted for recs.  PAST MEDICAL HISTORY :  Past Medical History  Diagnosis Date  . Arthritis   . Gout   . Osteoarthritis   . Diabetes mellitus without complication   . Hypertension   . Dementia   . Cancer   . Anemia   . GERD (gastroesophageal reflux disease)    Past Surgical History  Procedure Laterality Date  . Mastectomy    . Abdominal hysterectomy    . Hip arthroplasty Left 03/09/2014    Procedure: ARTHROPLASTY BIPOLAR HIP;  Surgeon: [name], MD;  Location: [place];  Service: Orthopedics;  Laterality: Left;   Prior to Admission medications   Medication Sig Start Date End Date Taking? Authorizing Provider  aspirin 325 MG tablet Take 325 mg by mouth 2 (two) times daily.   Yes Historical Provider, MD  aspirin EC 81 MG tablet Take 1 tablet (81 mg total) by mouth daily. 04/14/14  Yes Barton Dubois, MD  B Complex Vitamins (VITAMIN B COMPLEX PO) Take 1 capsule by mouth daily.   Yes Historical Provider, MD  cephALEXin (KEFLEX) 500 MG capsule Take 500 mg by mouth once.   Yes Historical Provider, MD  Cholecalciferol (VITAMIN D-3) 1000 UNITS CAPS Take 1 capsule by mouth daily.   Yes Historical Provider, MD  citalopram (CELEXA) 10 MG tablet Take 10 mg by mouth daily.   Yes Historical Provider, MD  docusate sodium (COLACE) 100 MG capsule Take 1 capsule (100 mg total) by mouth 2 (two) times daily. 03/09/14  Yes M. Mendel Ryder  Fenton Malling, PA-C  feeding supplement, GLUCERNA SHAKE, (GLUCERNA SHAKE) LIQD Take 237 mLs by mouth 2 (two) times daily between meals. 04/06/14  Yes Barton Dubois, MD  ferrous sulfate 325 (65 FE) MG tablet Take 325 mg by mouth daily with breakfast.   Yes Historical Provider, MD  HYDROcodone-acetaminophen (NORCO) 5-325 MG per tablet Take 1 tablet by mouth every 6 (six) hours as needed for moderate pain. 04/06/14  Yes Barton Dubois, MD  leflunomide (ARAVA) 20 MG tablet Take 20 mg by mouth daily.   Yes Historical Provider, MD  lidocaine (LIDODERM) 5 % Place 1 patch onto the skin  daily. Remove & Discard patch within 12 hours or as directed by MD   Yes Historical Provider, MD  LORazepam (ATIVAN) 0.5 MG tablet Take 0.5 mg by mouth every 12 (twelve) hours as needed for anxiety.   Yes Historical Provider, MD  Memantine HCl ER 7 MG CP24 Take 1 capsule by mouth daily with breakfast.   Yes Historical Provider, MD  metoprolol tartrate (LOPRESSOR) 25 MG tablet Take 12.5 mg by mouth 2 (two) times daily.   Yes Historical Provider, MD  Multiple Vitamins-Minerals (THERADEX M PO) Take 1 tablet by mouth daily.   Yes Historical Provider, MD  omeprazole (PRILOSEC) 20 MG capsule Take 20 mg by mouth daily.   Yes Historical Provider, MD  predniSONE (DELTASONE) 2.5 MG tablet Take 2.5-5 mg by mouth 2 (two) times daily. Give 2 tablets (5 mg) in the morning and 1 tablet (2.5 mg) in the evening   Yes Historical Provider, MD  risperiDONE (RISPERDAL) 0.25 MG tablet Take 1 tablet (0.25 mg total) by mouth at bedtime. 04/06/14  Yes Barton Dubois, MD  simethicone (MI-ACID GAS RELIEF) 80 MG chewable tablet Chew 80 mg by mouth 2 (two) times daily.   Yes Historical Provider, MD  sitaGLIPtin (JANUVIA) 25 MG tablet Take 1 tablet (25 mg total) by mouth daily. 04/06/14  Yes Barton Dubois, MD  vitamin C (ASCORBIC ACID) 500 MG tablet Take 500 mg by mouth daily.   Yes Historical Provider, MD  magnesium oxide (MAGOX 400) 400 (241.3 MG) MG tablet Take 1 tablet (400 mg total) by mouth daily. 04/06/14   Barton Dubois, MD   No Known Allergies  FAMILY HISTORY:  No family history on file. SOCIAL HISTORY:  reports that she has quit smoking. She does not have any smokeless tobacco history on file. She reports that she does not drink alcohol or use illicit drugs.  REVIEW OF SYSTEMS:  Unable to complete as pt is altered.  SUBJECTIVE:   Febrile to 102.2, tachy to 116.  Pt asleep in bed.  VITAL SIGNS: Temp:  [99.3 F (37.4 C)-102.2 F (39 C)] 102.2 F (39 C) (04/29 0000) Pulse Rate:  [96-129] 116 (04/29 0230) Resp:   [16-21] 18 (04/29 0230) BP: (135-166)/(61-84) 166/77 mmHg (04/29 0200) SpO2:  [91 %-100 %] 94 % (04/29 0230) Weight:  [59.4 kg (130 lb 15.3 oz)] 59.4 kg (130 lb 15.3 oz) (04/28 1900) HEMODYNAMICS:   VENTILATOR SETTINGS:   INTAKE / OUTPUT: Intake/Output     04/28 0701 - 04/29 0700   I.V. (mL/kg) 52.1 (0.9)   IV Piggyback 150   Total Intake(mL/kg) 202.1 (3.4)   Net +202.1         PHYSICAL EXAMINATION: General: Elderly female, resting in bed, in NAD. Neuro: Demented, moaning/groaning. HEENT: [place]/AT. PERRL, sclerae anicteric.  MM dry. Cardiovascular: RRR, no M/R/G.  Lungs: Respirations even and unlabored.  CTA bilaterally, No W/R/R.  Abdomen:  BS x 4, soft, NT/ND.  Musculoskeletal: No gross deformities, no edema. Left hip incision with mild drainage, yellowish in color.  Left hip tender to palpation. Skin: Intact, warm, no rashes.  Left hip incision drainage as above.    LABS:  CBC  Recent Labs Lab 04/25/14 1543  WBC 15.5*  HGB 6.5*  HCT 20.1*  PLT 402*   Coag's  Recent Labs Lab 04/25/14 1543  APTT 29  INR 1.07   BMET  Recent Labs Lab 04/25/14 1543  NA 140  K 4.6  CL 105  CO2 21  BUN 23  CREATININE 0.76  GLUCOSE 163*   Electrolytes  Recent Labs Lab 04/25/14 1543  CALCIUM 9.4   Sepsis Markers  Recent Labs Lab 04/25/14 1543 04/25/14 1907  LATICACIDVEN 1.7 1.86  PROCALCITON 1.35  --    ABG No results found for this basename: PHART, PCO2ART, PO2ART,  in the last 168 hours Liver Enzymes  Recent Labs Lab 04/25/14 1543  AST 43*  ALT 28  ALKPHOS 250*  BILITOT 0.7  ALBUMIN 1.7*   Cardiac Enzymes  Recent Labs Lab 04/25/14 1543  TROPONINI <0.30  PROBNP 4705.0*   Glucose  Recent Labs Lab 04/25/14 2107  GLUCAP 119*    Imaging Dg Chest 1 View  04/25/2014   CLINICAL DATA:  infection  EXAM: CHEST - 1 VIEW  COMPARISON:  DG CHEST 1V PORT dated 04/03/2014  FINDINGS: The heart size and mediastinal contours are within normal limits.  Both lungs are clear. Degenerative changes are appreciated within the left shoulder.  IMPRESSION: No active disease.   Electronically Signed   By: Margaree Mackintosh M.D.   On: 04/25/2014 17:34   Dg Hip Complete Left  04/25/2014   CLINICAL DATA:  infection, pain  EXAM: LEFT HIP - COMPLETE 2+ VIEW  COMPARISON:  DG HIP PORTABLE 1 VIEW*L* dated 03/09/2014; DG HIP COMPLETE*L* dated 03/08/2014  FINDINGS: There is no evidence of hip fracture or dislocation. There is no evidence of arthropathy or other focal bone abnormality. The patient's total left hip arthroplasty is intact without evidence of loosening or failure. There is no evidence of subcutaneous emphysema nor evidence of cortical irregularity.  IMPRESSION: Negative.   Electronically Signed   By: Margaree Mackintosh M.D.   On: 04/25/2014 17:33   Dg Chest Port 1 View  04/26/2014   CLINICAL DATA:  Assess central line placement.  EXAM: PORTABLE CHEST - 1 VIEW  COMPARISON:  DG CHEST 1 VIEW dated 04/25/2014  FINDINGS: The patient has undergone placement of a left internal jugular venous catheter. The tip lies in the region of the distal SVC. There is no evidence of postprocedure pneumothorax or pneumothorax. The cardiopericardial silhouette is top-normal in size. The pulmonary vascularity is and not engorged. The lungs are adequately inflated and clear. There are degenerative changes of both shoulders.  IMPRESSION: There is no evidence of a postprocedure complication following placement of the left internal jugular venous catheter.   Electronically Signed   By: David  Martinique   On: 04/26/2014 01:24     ASSESSMENT / PLAN:  PULMONARY A: No acute issues. P:   - Monitor.  CARDIOVASCULAR A:  Tachycardia related to SIRS/Sepsis P:  - IVF resuscitation.  RENAL A:  No acute issues. P:   - BMP in AM. - Correct electrolytes as indicated.  GASTROINTESTINAL A:   NPO P:   - SUP not indicated. - May revisit diet status if no plans for OR in  AM.  HEMATOLOGIC  A:   Anemia - likely blood loss s/p left hip hemiarthroplasty. P:  - PRBC's x 2. - SCD's for VTE proph. - CBC in AM.  INFECTIOUS A:   Sepsis - likely source is left hip infection. P:   - Continue abx, narrow as cultures result. - Follow PCT. - Likely to OR in AM for left hip debridement. - Tylenol / Ibuprofen as ordered for fever. - Follow fever curve / WBC's.  ENDOCRINE A:   DM P:   - SSI.  NEUROLOGIC A:  Dementia Acute Encephalopathy. P:   - Monitor. - Pain control.  RHEUMATOLOGY A: Hx of rheumatoid arthritis >> on chronic prednisone as outpt. P: Might require stress steroids after surgery   Montey Hora, PA - [facility] Pgr: [phone]  or [phone]   Pulmonary and [place] Pager: [phone]  04/26/2014, 2:51 AM  Reviewed above, examined pt.  80 yo female with Lt hip fx s/p repair in March 2015.  She has hx of RA on chronic prednisone, and hx of dementia.  She was brought back to hospital with Lt hip pain and purulent drainage from Lt hip wound.  She has been started on Abx, and ortho consulted for surgical debridement of wound.  She is to get PRBC for Hb < 7.  She was noted to have increased heart rate in setting of fever >> SIRS/Sepsis.  Maintaining blood pressure otherwise.  Will continue to monitor.  Given pt's current clinical status plus comorbidities, might benefit from further discussions about appropriate goals of care.  Chesley Mires, MD [facility] Pulmonary/Critical Care 04/26/2014, 3:14 AM Pager:  [phone] After 3pm call: [phone]

## 2014-04-26 NOTE — Progress Notes (Signed)
[facility] Progress Note Patient Name: Michelle Yoder DOB: 08/25/1923 MRN: [id number]  Date of Service  04/26/2014   HPI/Events of Note   RN says GPC in blood. Patient on vanc  RN clinically suspects patient still dehdyrated thiough RH 74 and BP MAP 78 and getting 155ml/h saline. This is because patient is oliguric   eICU Interventions  - continue vanc - 1L bolus and continue 126mL/h-> check bmet in 1h   Intervention Category Major Interventions: Other:  Michelle Yoder 04/26/2014, 3:15 PM

## 2014-04-26 NOTE — Procedures (Signed)
Central Venous Catheter Insertion Procedure Note Michelle Yoder [id number] 03-28-1923  Procedure: Insertion of Central Venous Catheter Indications: Assessment of intravascular volume, Drug and/or fluid administration and Frequent blood sampling  Procedure Details Consent: Risks of procedure as well as the alternatives and risks of each were explained to the (patient/caregiver).  Consent for procedure obtained. Time Out: Verified patient identification, verified procedure, site/side was marked, verified correct patient position, special equipment/implants available, medications/allergies/relevent history reviewed, required imaging and test results available.  Performed  Maximum sterile technique was used including antiseptics, cap, gloves, gown, hand hygiene, mask and sheet. Skin prep: Chlorhexidine; local anesthetic administered A antimicrobial bonded/coated triple lumen catheter was placed in the left internal jugular vein using the Seldinger technique.  Evaluation Blood flow good Complications: No apparent complications Patient did tolerate procedure well. Chest X-ray ordered to verify placement.  CXR: pending.  Procedure performed under direct US guidance.   Montey Hora, [place] [facility] Pgr: [phone]  or [phone]

## 2014-04-26 NOTE — ED Provider Notes (Signed)
Medical screening examination/treatment/procedure(s) were conducted as a shared visit with non-physician practitioner(s) and myself.  I personally evaluated the patient during the encounter.   EKG Interpretation   Date/Time:  Tuesday April 25 2014 18:32:10 EDT Ventricular Rate:  115 PR Interval:  135 QRS Duration: 123 QT Interval:  338 QTC Calculation: 467 R Axis:   80 Text Interpretation:  Sinus tachycardia Right bundle branch block ST depr,  consider ischemia, inferior leads No significant change since last tracing  Confirmed by Zenia Resides  MD, Saahas Hidrogo ([id number]) on 04/25/2014 9:36:22 PM       Leota Jacobsen, MD 04/26/14 2250

## 2014-04-26 NOTE — Interval H&P Note (Signed)
History and Physical Interval Note:  04/26/2014 7:44 AM  Michelle Yoder  has presented today for surgery, with the diagnosis of Infected Left Hip  The various methods of treatment have been discussed with the patient and family. After consideration of risks, benefits and other options for treatment, the patient has consented to  Procedure(s) with comments: IRRIGATION AND DEBRIDEMENT EXTREMITY (Left) - Will call in the AM wants a 7:30 start time. as a surgical intervention .  The patient's history has been reviewed, patient examined, no change in status, stable for surgery.  I have reviewed the patient's chart and labs.  Questions were answered to the patient's satisfaction.     [name]

## 2014-04-26 NOTE — H&P (View-Only) (Signed)
ORTHOPAEDIC CONSULTATION  REQUESTING PHYSICIAN: Leota Jacobsen, MD  Chief Complaint: Left hip infection  HPI: Michelle Yoder is a 80 y.o. female who was brought in from the nursing home today with severe drainage of the left hip, altered mental status, progressive deterioration in function. She broke her hip 6 weeks ago, and had a hemiarthroplasty performed. She apparently has had a draining fluctuant wound for the past 4 weeks, and was readmitted recently for dehydration, and stayed in the hospital for about a week, and then was discharged to the nursing home. Orthopedic consultation was requested du to her severe sepsis, and question about management of her hip. She is not mentally capable of complaining of pain, and apparently she has been more combative since the surgery on her hip. Before she broke her hip she was able to stand and take a couple of steps, but she preferred using a wheelchair, and was a minimal ambulator.   She has a past history of rheumatoid arthritis, on methotrexate, until she developed liver dysfunction, and the methotrexate discontinued.   Past Medical History  Diagnosis Date  . Arthritis   . Gout   . Osteoarthritis   . Diabetes mellitus without complication   . Hypertension   . Dementia   . Cancer   . Anemia   . GERD (gastroesophageal reflux disease)    Past Surgical History  Procedure Laterality Date  . Mastectomy    . Abdominal hysterectomy    . Hip arthroplasty Left 03/09/2014    Procedure: ARTHROPLASTY BIPOLAR HIP;  Surgeon: [name], MD;  Location: [place];  Service: Orthopedics;  Laterality: Left;   History   Social History  . Marital Status: Widowed    Spouse Name: N/A    Number of Children: N/A  . Years of Education: N/A   Social History Main Topics  . Smoking status: Former Research scientist (life sciences)  . Smokeless tobacco: None  . Alcohol Use: No  . Drug Use: No  . Sexual Activity: None   Other Topics Concern  . None   Social History Narrative   . None   No family history on file. her daughter denies rheumatoid arthritis, and her husband died in 02/18/23.  No Known Allergies Prior to Admission medications   Medication Sig Start Date End Date Taking? Authorizing Provider  aspirin 325 MG tablet Take 325 mg by mouth 2 (two) times daily.   Yes Historical Provider, MD  aspirin EC 81 MG tablet Take 1 tablet (81 mg total) by mouth daily. 04/14/14  Yes Barton Dubois, MD  B Complex Vitamins (VITAMIN B COMPLEX PO) Take 1 capsule by mouth daily.   Yes Historical Provider, MD  cephALEXin (KEFLEX) 500 MG capsule Take 500 mg by mouth once.   Yes Historical Provider, MD  Cholecalciferol (VITAMIN D-3) 1000 UNITS CAPS Take 1 capsule by mouth daily.   Yes Historical Provider, MD  citalopram (CELEXA) 10 MG tablet Take 10 mg by mouth daily.   Yes Historical Provider, MD  docusate sodium (COLACE) 100 MG capsule Take 1 capsule (100 mg total) by mouth 2 (two) times daily. 03/09/14  Yes M. Doran Stabler, PA-C  feeding supplement, GLUCERNA SHAKE, (GLUCERNA SHAKE) LIQD Take 237 mLs by mouth 2 (two) times daily between meals. 04/06/14  Yes Barton Dubois, MD  ferrous sulfate 325 (65 FE) MG tablet Take 325 mg by mouth daily with breakfast.   Yes Historical Provider, MD  HYDROcodone-acetaminophen (NORCO) 5-325 MG per tablet Take 1 tablet by mouth every 6 (  six) hours as needed for moderate pain. 04/06/14  Yes Barton Dubois, MD  leflunomide (ARAVA) 20 MG tablet Take 20 mg by mouth daily.   Yes Historical Provider, MD  lidocaine (LIDODERM) 5 % Place 1 patch onto the skin daily. Remove & Discard patch within 12 hours or as directed by MD   Yes Historical Provider, MD  LORazepam (ATIVAN) 0.5 MG tablet Take 0.5 mg by mouth every 12 (twelve) hours as needed for anxiety.   Yes Historical Provider, MD  Memantine HCl ER 7 MG CP24 Take 1 capsule by mouth daily with breakfast.   Yes Historical Provider, MD  metoprolol tartrate (LOPRESSOR) 25 MG tablet Take 12.5 mg by mouth 2 (two)  times daily.   Yes Historical Provider, MD  Multiple Vitamins-Minerals (THERADEX M PO) Take 1 tablet by mouth daily.   Yes Historical Provider, MD  omeprazole (PRILOSEC) 20 MG capsule Take 20 mg by mouth daily.   Yes Historical Provider, MD  predniSONE (DELTASONE) 2.5 MG tablet Take 2.5-5 mg by mouth 2 (two) times daily. Give 2 tablets (5 mg) in the morning and 1 tablet (2.5 mg) in the evening   Yes Historical Provider, MD  risperiDONE (RISPERDAL) 0.25 MG tablet Take 1 tablet (0.25 mg total) by mouth at bedtime. 04/06/14  Yes Barton Dubois, MD  simethicone (MI-ACID GAS RELIEF) 80 MG chewable tablet Chew 80 mg by mouth 2 (two) times daily.   Yes Historical Provider, MD  sitaGLIPtin (JANUVIA) 25 MG tablet Take 1 tablet (25 mg total) by mouth daily. 04/06/14  Yes Barton Dubois, MD  vitamin C (ASCORBIC ACID) 500 MG tablet Take 500 mg by mouth daily.   Yes Historical Provider, MD  magnesium oxide (MAGOX 400) 400 (241.3 MG) MG tablet Take 1 tablet (400 mg total) by mouth daily. 04/06/14   Barton Dubois, MD   Dg Chest 1 View  04/25/2014   CLINICAL DATA:  infection  EXAM: CHEST - 1 VIEW  COMPARISON:  DG CHEST 1V PORT dated 04/03/2014  FINDINGS: The heart size and mediastinal contours are within normal limits. Both lungs are clear. Degenerative changes are appreciated within the left shoulder.  IMPRESSION: No active disease.   Electronically Signed   By: Margaree Mackintosh M.D.   On: 04/25/2014 17:34   Dg Hip Complete Left  04/25/2014   CLINICAL DATA:  infection, pain  EXAM: LEFT HIP - COMPLETE 2+ VIEW  COMPARISON:  DG HIP PORTABLE 1 VIEW*L* dated 03/09/2014; DG HIP COMPLETE*L* dated 03/08/2014  FINDINGS: There is no evidence of hip fracture or dislocation. There is no evidence of arthropathy or other focal bone abnormality. The patient's total left hip arthroplasty is intact without evidence of loosening or failure. There is no evidence of subcutaneous emphysema nor evidence of cortical irregularity.  IMPRESSION: Negative.    Electronically Signed   By: Margaree Mackintosh M.D.   On: 04/25/2014 17:33    Positive ROS: All other systems have been reviewed and were otherwise negative with the exception of those mentioned in the HPI and as above.  Physical Exam: General:  she is lying on a gurney, in mild distress, although she appears comfortable, but is moderately combative at times. She does appear fairly pale.  Cardiovascular:  she has mild to moderate bilateral pedal edema  Respiratory: No cyanosis, no use of accessory musculature GI: No organomegaly, abdomen is soft and non-tender Skin: she has a surgical wound over the left hip that has serosanguineous drainage that is somewhat purulent.  Neurologic: Sensation  intact distally Psychiatric: Patient is  not competent for consent, but her daughter is at the bedside.  Lymphatic: No axillary or cervical lymphadenopathy  MUSCULOSKELETAL: She will move her toes with a flicker of EHL and FHL, she will follow commands with difficulty and reluctance.   Assessment: Infected left hip hemiarthroplasty, with some systemic signs of sepsis, with a temperature of 101.2 by rectal testing, and she is currently tachycardic at 126, but has maintained her blood pressure in the 140s.   she also has severe anemia, unclear etiology, but likely from the postsurgical ongoing drainage for the past month.   Plan: She is being admitted to the medical service, and given IV antibiotics due to her impending sepsis, she's been received blood, which will have to be optimized and monitored due to the fact that she may have some element of heart failure as well. I had a long discussion with the daughter regarding the risks benefits and alternatives to surgical intervention, and she has elected for her mother to undergo surgical irrigation and debridement, with probable resection hip arthroplasty/[place], with removal of the prosthesis. Given the fact that her infection has been present for at least  the past month, I do not think that retention of the prosthesis is possible given her coexisting risk factors, and I doubt that she will be able to survive multiple operations, which I think would be reamed needed in order to reimplant a prosthesis. Additionally, she was not much of an ambulator to begin with, and so I think that surgical resection would be the safest the most appropriate course of action.  Having said that she will need to be optimized prior to proceeding, and I'm not sure whether or not she can get enough blood transfusion to have her optimized, in the setting of her congestive heart failure, prior to tomorrow. Having said that, we will reserve her room for surgical intervention tomorrow if she is optimized, and Dr. Edmonia Lynch will be checking on her tomorrow morning to make final decisions and to coordinate with the internal medicine team.  This is a severe life-threatening condition, and she certainly may not survive this complication. I've discussed this in detail with the daughter.   Johnny Bridge, MD Cell [phone]   04/25/2014 9:46 PM

## 2014-04-26 NOTE — Op Note (Signed)
04/25/2014 - 04/26/2014  9:28 AM  PATIENT:  Michelle Yoder    PRE-OPERATIVE DIAGNOSIS:  Infected Left Hip  POST-OPERATIVE DIAGNOSIS:  Same  PROCEDURE:  HARDWARE REMOVAL, IRRIGATION AND DEBRIDEMENT HIP WITH ANTIBIOTIC BEAD INSERTION  SURGEON:  [name], MD  ASSISTANT: Joya Gaskins OPA  ANESTHESIA:   Gen  PREOPERATIVE INDICATIONS:  Michelle Yoder is a  80 y.o. female with a diagnosis of Infected Left Hip who failed conservative measures and elected for surgical management.    The risks benefits and alternatives were discussed with the patient preoperatively including but not limited to the risks of infection, bleeding, nerve injury, cardiopulmonary complications, the need for revision surgery, among others, and the patient was willing to proceed.  OPERATIVE IMPLANTS: none  OPERATIVE FINDINGS: dislocated hip, purulence tracking to the prosthesis  BLOOD LOSS: 50  COMPLICATIONS: none  TOURNIQUET TIME: none  INDICATIONS: Mr. it is an 80 year old female who had a left femoral neck fracture in early March and underwent a left hip hemiarthroplasty on March 12. She was initially seen on April 1 her first postoperative visit and was stable with some mild drainage proximally there was clearing up. She then appropriately suffered a fall in Easter and likely had a hip dislocation and then in hindsight. She's had persistent drainage that worsened to the point of aggressive drainage yesterday*sent to the emergency room where she was evaluated she was admitted by the medicine team she had almost septic appearance she was stabilized and did require 2 units of blood but is now stable. I then a lengthy discussion with her daughter who is her power of attorney with regards to her options at this point I think that she is going to do better with irrigation debridement and hardware removal rather than waiting for more optimization. Her hemoglobin was 6.4 but after a one unit has come up to 75 and  she is getting another unit as we speak. I discussed for her previous status with the daughter she was minimally ambulatory and preferred to be in a wheelchair after these discussions we talked about a [place] procedure and the daughter was agreeable to this and felt as though is the best option to help prevent future surgery for her.  OPERATIVE PROCEDURE:  Patient was identified in the preoperative holding area and site was marked by me She was transported to the operating theater and placed on the table in supine position taking care to pad all bony prominences. After a preincinduction time out anesthesia was induced. The left lower extremity was prepped and draped in normal sterile fashion and a pre-incision timeout was performed. She received cefipime and vanc for preoperative antibiotics.   Upon placing on the table and noted that her leg was shortened and internally rotated I felt that she was in all likelihood dislocated in hindsight is likely occurred several weeks ago possibly a fall over Easter. She was placed in the lateral position taking care to pad all her bony prominences. The left lower extremity was prepped and draped in the normal sterile fashion a pre-incision timeout was performed she had received to proceed cefepime and vancomycin. I made an incision to her previous incision medially noting purulent fluid no necrotic material I was able to split her IT band bluntly. I noted that her posterior capsular repair had failed and the head was sitting dislocated. The purulent fluid tracked down around the head the stem was stable.  I then removed the prosthesis as well as the Fiber[employer] capsular  stitches a knife the head off and then used the handle to remove the prosthesis it was well fixed but came out hole. I then scraped out the canal and thoroughly irrigated everything with the 3 L of saline I then inspected the acetabulum and there was a significant amount of fibrous material that had  felt developed and here indicating that the had not been in for some time. I debrided this found no more necrotic or purulent fluid I thoroughly thoroughly irrigated the wound again with another 3 L of saline.  I placed formalin beads with gent and vancomycin mixture into the acetabulum the canal and the wound to help close some of the dead space. I then placed the cheek deep drain. I then closed the IT band with a monofilament PDS suture then closed the skin with a monofilament as well. I placed a sterile suction dressing over top of the closed incision. I left the drain clamped. She was then awoken and taken to the PACU in stable condition.  POST OPERATIVE PLAN: She will stay in a knee immobilizer she'll be nonweightbearing on this left leg the drain will stay clamped until 12 hours from now which will we will charge it all maintain a suction dressing over the incision itself likely for the next 7-10 days to prevent contamination I given her incontinence. I initiate aspirin 325 daily for DVT prophylaxis all obtain a infectious disease consult she will remain on the medicine service likely going to the ICU for close monitoring.

## 2014-04-26 NOTE — Anesthesia Procedure Notes (Signed)
Procedure Name: Intubation Date/Time: 04/26/2014 8:35 AM Performed by: [place] Pink Pre-anesthesia Checklist: Patient identified, Timeout performed, Emergency Drugs available, Suction available and Patient being monitored Patient Re-evaluated:Patient Re-evaluated prior to inductionOxygen Delivery Method: Circle system utilized Preoxygenation: Pre-oxygenation with 100% oxygen Intubation Type: IV induction Ventilation: Mask ventilation without difficulty Laryngoscope Size: Mac and 3 Grade View: Grade II Tube type: Subglottic suction tube Tube size: 7.0 mm Number of attempts: 1 Airway Equipment and Method: Stylet Placement Confirmation: ETT inserted through vocal cords under direct vision,  positive ETCO2 and breath sounds checked- equal and bilateral Secured at: 22 cm Tube secured with: Tape Dental Injury: Teeth and Oropharynx as per pre-operative assessment

## 2014-04-26 NOTE — Consult Note (Signed)
[facility]    Date of Admission:  04/25/2014  Date of Consult:  04/26/2014  Reason for Consult: THA infection Referring Physician: Dr. Percell Miller   HPI: Michelle Yoder is an 81 y.o. female.  with prior left hip fracture status post repair March 2015, dementia, hypertension presented with sepsis, left hip abscess, anemia. Patient was noted to have fallen and broken her left hip about 6 weeks ago. Per the daughter, this was initially listed the original nursing home. Once imaging was obtained, patient was directed to [facility] for further evaluation. Patient had surgical repair of left hip on March 12. Per the daughter, since that time, patient has had recurrent episodes of fever and confusion. Patient was last admitted March 5 to the 10th for SIRS and delirium. Had a left hip wound culture that grew out pseudomonas. Pansensitive culture. Was discharged on 10 days of cipro to SNF. Completed course. Per the daughter, pt had fever within last 24 hours. Frank pus was noted to be draining from hip wound site. Raliegh Ip was contacted. Recommendation was for pt to go to ER for further evaluation. Daughter states that pt has had persistent confusion and decreased appetite since hip fracture. 1 episoded of emesis. ? l sided visual loss since L hip fracture. Denies any overt hemiparesis.   In the ED patient became less hemodynamically stable and after blood cultures were drawn she was started on vancomycin and cefepime  She was taken to the OR today and underwent I and D and removal of hardware, cultures obtained and abx beads. She is currrently in the ICU and delirious.   Past Medical History  Diagnosis Date  . Arthritis   . Gout   . Osteoarthritis   . Diabetes mellitus without complication   . Hypertension   . Dementia   . Cancer   . Anemia   . GERD (gastroesophageal reflux disease)     Past Surgical History  Procedure Laterality Date  . Mastectomy    .  Abdominal hysterectomy    . Hip arthroplasty Left 03/09/2014    Procedure: ARTHROPLASTY BIPOLAR HIP;  Surgeon: [name], MD;  Location: [facility];  Service: Orthopedics;  Laterality: Left;  ergies:   No Known Allergies   Medications: I have reviewed patients current medications as documented in Epic Anti-infectives   Start     Dose/Rate Route Frequency Ordered Stop   04/26/14 1400  ceFEPIme (MAXIPIME) 1 g in dextrose 5 % 50 mL IVPB     1 g 100 mL/hr over 30 Minutes Intravenous Every 12 hours 04/26/14 1358     04/26/14 1145  ceFAZolin (ANCEF) IVPB 2 g/50 mL premix  Status:  Discontinued     2 g 100 mL/hr over 30 Minutes Intravenous Every 6 hours 04/26/14 1142 04/26/14 1152   04/26/14 0916  gentamicin (GARAMYCIN) injection  Status:  Discontinued       As needed 04/26/14 0917 04/26/14 0947   04/26/14 0916  vancomycin (VANCOCIN) powder  Status:  Discontinued       As needed 04/26/14 0919 04/26/14 0947   04/25/14 2200  vancomycin (VANCOCIN) 500 mg in sodium chloride 0.9 % 100 mL IVPB     500 mg 100 mL/hr over 60 Minutes Intravenous Every 12 hours 04/25/14 2106     04/25/14 2200  ceFEPIme (MAXIPIME) 1 g in dextrose 5 % 50 mL IVPB  Status:  Discontinued     1 g 100 mL/hr over 30 Minutes  Intravenous Every 24 hours 04/25/14 2106 04/26/14 1358   04/25/14 1630  vancomycin (VANCOCIN) IVPB 1000 mg/200 mL premix  Status:  Discontinued     1,000 mg 200 mL/hr over 60 Minutes Intravenous  Once 04/25/14 1626 04/25/14 1758   04/25/14 1630  ceFEPIme (MAXIPIME) 1 g in dextrose 5 % 50 mL IVPB  Status:  Discontinued     1 g 100 mL/hr over 30 Minutes Intravenous  Once 04/25/14 1626 04/25/14 1758      Social History:  reports that she has quit smoking. She does not have any smokeless tobacco history on file. She reports that she does not drink alcohol or use illicit drugs.  No family history on file.  As in HPI and primary teams notes otherwise 12 point review of systems is negative  Blood  pressure 140/71, pulse 88, temperature 98.1 F (36.7 C), temperature source Oral, resp. rate 13, height 5' 4.17" (1.63 m), weight 138 lb 10.7 oz (62.9 kg), SpO2 93.00%. General: Alert and awake delirious and restrained HEENT: anicteric sclera,EOMI, oropharynx clear and without exudate CVS regular rate, normal r,  no murmur rubs or gallops Chest: clear to auscultation bilaterally, no wheezing, rales or rhonchi Abdomen: soft nontender, nondistended, normal bowel sounds, Extremities: hip bandage in place Skin: no rash Neuro: nonfocal, strength and sensation intact   Results for orders placed during the hospital encounter of 04/25/14 (from the past 48 hour(s))  CBC WITH DIFFERENTIAL     Status: Abnormal   Collection Time    04/25/14  3:43 PM      Result Value Ref Range   WBC 15.5 (*) 4.0 - 10.5 K/uL   RBC 2.14 (*) 3.87 - 5.11 MIL/uL   Hemoglobin 6.5 (*) 12.0 - 15.0 g/dL   Comment: REPEATED TO VERIFY     CRITICAL RESULT CALLED TO, READ BACK BY AND VERIFIED WITH:     K FIELDS RN 1919 04/25/14 A BROWNING   HCT 20.1 (*) 36.0 - 46.0 %   MCV 93.9  78.0 - 100.0 fL   MCH 30.4  26.0 - 34.0 pg   MCHC 32.3  30.0 - 36.0 g/dL   RDW 19.3 (*) 11.5 - 15.5 %   Platelets 402 (*) 150 - 400 K/uL   Neutrophils Relative % 86 (*) 43 - 77 %   Neutro Abs 13.4 (*) 1.7 - 7.7 K/uL   Lymphocytes Relative 6 (*) 12 - 46 %   Lymphs Abs 1.0  0.7 - 4.0 K/uL   Monocytes Relative 7  3 - 12 %   Monocytes Absolute 1.2 (*) 0.1 - 1.0 K/uL   Eosinophils Relative 0  0 - 5 %   Eosinophils Absolute 0.0  0.0 - 0.7 K/uL   Basophils Relative 0  0 - 1 %   Basophils Absolute 0.0  0.0 - 0.1 K/uL  COMPREHENSIVE METABOLIC PANEL     Status: Abnormal   Collection Time    04/25/14  3:43 PM      Result Value Ref Range   Sodium 140  137 - 147 mEq/L   Potassium 4.6  3.7 - 5.3 mEq/L   Comment: HEMOLYSIS AT THIS LEVEL MAY AFFECT RESULT   Chloride 105  96 - 112 mEq/L   CO2 21  19 - 32 mEq/L   Glucose, Bld 163 (*) 70 - 99 mg/dL   BUN  23  6 - 23 mg/dL   Creatinine, Ser 0.76  0.50 - 1.10 mg/dL   Calcium 9.4  8.4 -  10.5 mg/dL   Total Protein 5.7 (*) 6.0 - 8.3 g/dL   Albumin 1.7 (*) 3.5 - 5.2 g/dL   AST 43 (*) 0 - 37 U/L   Comment: HEMOLYSIS AT THIS LEVEL MAY AFFECT RESULT   ALT 28  0 - 35 U/L   Comment: HEMOLYSIS AT THIS LEVEL MAY AFFECT RESULT   Alkaline Phosphatase 250 (*) 39 - 117 U/L   Total Bilirubin 0.7  0.3 - 1.2 mg/dL   GFR calc non Af Amer 73 (*) >90 mL/min   GFR calc Af Amer 85 (*) >90 mL/min   Comment: (NOTE)     The eGFR has been calculated using the CKD EPI equation.     This calculation has not been validated in all clinical situations.     eGFR's persistently <90 mL/min signify possible Chronic Kidney     Disease.  APTT     Status: None   Collection Time    04/25/14  3:43 PM      Result Value Ref Range   aPTT 29  24 - 37 seconds  PRO B NATRIURETIC PEPTIDE     Status: Abnormal   Collection Time    04/25/14  3:43 PM      Result Value Ref Range   Pro B Natriuretic peptide (BNP) 4705.0 (*) 0 - 450 pg/mL  TROPONIN I     Status: None   Collection Time    04/25/14  3:43 PM      Result Value Ref Range   Troponin I <0.30  <0.30 ng/mL   Comment:            Due to the release kinetics of cTnI,     a negative result within the first hours     of the onset of symptoms does not rule out     myocardial infarction with certainty.     If myocardial infarction is still suspected,     repeat the test at appropriate intervals.  LACTIC ACID, PLASMA     Status: None   Collection Time    04/25/14  3:43 PM      Result Value Ref Range   Lactic Acid, Venous 1.7  0.5 - 2.2 mmol/L  PROTIME-INR     Status: None   Collection Time    04/25/14  3:43 PM      Result Value Ref Range   Prothrombin Time 13.7  11.6 - 15.2 seconds   INR 1.07  0.00 - 1.49  PROCALCITONIN     Status: None   Collection Time    04/25/14  3:43 PM      Result Value Ref Range   Procalcitonin 1.35     Comment:            Interpretation:      PCT > 0.5 ng/mL and <= 2 ng/mL:     Systemic infection (sepsis) is possible,     but other conditions are known to elevate     PCT as well.     (NOTE)             ICU PCT Algorithm               Non ICU PCT Algorithm        ----------------------------     ------------------------------             PCT < 0.25 ng/mL                 PCT <  0.1 ng/mL         Stopping of antibiotics            Stopping of antibiotics           strongly encouraged.               strongly encouraged.        ----------------------------     ------------------------------           PCT level decrease by               PCT < 0.25 ng/mL           >= 80% from peak PCT           OR PCT 0.25 - 0.5 ng/mL          Stopping of antibiotics                                                 encouraged.         Stopping of antibiotics               encouraged.        ----------------------------     ------------------------------           PCT level decrease by              PCT >= 0.25 ng/mL           < 80% from peak PCT            AND PCT >= 0.5 ng/mL            Continuing antibiotics                                                  encouraged.           Continuing antibiotics                encouraged.        ----------------------------     ------------------------------         PCT level increase compared          PCT > 0.5 ng/mL             with peak PCT AND              PCT >= 0.5 ng/mL             Escalation of antibiotics                                              strongly encouraged.          Escalation of antibiotics            strongly encouraged.  CULTURE, BLOOD (ROUTINE X 2)     Status: None   Collection Time    04/25/14  6:50 PM      Result Value Ref Range   Specimen Description BLOOD HAND RIGHT     Special Requests BOTTLES DRAWN AEROBIC AND ANAEROBIC 5CC     Culture  Setup  Time       Value: 04/26/2014 00:54     Performed at [facility]   Culture       Value: [place] IN [place]       Note: Gram Stain Report Called to,Read Back By and Verified With: NICOLE Z BY INGRAM A 3PM 04/26/14     Performed at [facility]   Report Status PENDING    I-STAT CG4 LACTIC ACID, ED     Status: None   Collection Time    04/25/14  7:07 PM      Result Value Ref Range   Lactic Acid, Venous 1.86  0.5 - 2.2 mmol/L  URINALYSIS, ROUTINE W REFLEX MICROSCOPIC     Status: Abnormal   Collection Time    04/25/14  7:26 PM      Result Value Ref Range   Color, Urine YELLOW  YELLOW   APPearance CLOUDY (*) CLEAR   Specific Gravity, Urine 1.026  1.005 - 1.030   pH 5.0  5.0 - 8.0   Glucose, UA NEGATIVE  NEGATIVE mg/dL   Hgb urine dipstick NEGATIVE  NEGATIVE   Bilirubin Urine NEGATIVE  NEGATIVE   Ketones, ur NEGATIVE  NEGATIVE mg/dL   Protein, ur 30 (*) NEGATIVE mg/dL   Urobilinogen, UA 0.2  0.0 - 1.0 mg/dL   Nitrite NEGATIVE  NEGATIVE   Leukocytes, UA NEGATIVE  NEGATIVE  URINE MICROSCOPIC-ADD ON     Status: None   Collection Time    04/25/14  7:26 PM      Result Value Ref Range   Squamous Epithelial / LPF RARE  RARE   WBC, UA 0-2  <3 WBC/hpf   RBC / HPF 0-2  <3 RBC/hpf   Bacteria, UA RARE  RARE   Urine-Other AMORPHOUS URATES/PHOSPHATES    PREPARE RBC (CROSSMATCH)     Status: None   Collection Time    04/25/14  8:00 PM      Result Value Ref Range   Order Confirmation ORDER PROCESSED BY BLOOD BANK    POC OCCULT BLOOD, ED     Status: None   Collection Time    04/25/14  8:31 PM      Result Value Ref Range   Fecal Occult Bld NEGATIVE  NEGATIVE  TYPE AND SCREEN     Status: None   Collection Time    04/25/14  8:53 PM      Result Value Ref Range   ABO/RH(D) O POS     Antibody Screen NEG     Sample Expiration 04/28/2014     Unit Number [id number]     Blood Component Type RED CELLS,LR     Unit division 00     Status of Unit ISSUED     Transfusion Status OK TO TRANSFUSE     Crossmatch Result Compatible     Unit Number [id number]     Blood Component Type RED CELLS,LR      Unit division 00     Status of Unit ISSUED     Transfusion Status OK TO TRANSFUSE     Crossmatch Result Compatible     Unit Number [id number]     Blood Component Type RED CELLS,LR     Unit division 00     Status of Unit ALLOCATED     Transfusion Status OK TO TRANSFUSE     Crossmatch Result Compatible     Unit Number [id number]     Blood Component Type RBC LR PHER1  Unit division 00     Status of Unit ISSUED     Transfusion Status OK TO TRANSFUSE     Crossmatch Result Compatible    CBG MONITORING, ED     Status: Abnormal   Collection Time    04/25/14  9:07 PM      Result Value Ref Range   Glucose-Capillary 119 (*) 70 - 99 mg/dL  PREPARE RBC (CROSSMATCH)     Status: None   Collection Time    04/25/14 10:00 PM      Result Value Ref Range   Order Confirmation ORDER PROCESSED BY BLOOD BANK    MRSA PCR SCREENING     Status: None   Collection Time    04/25/14 11:53 PM      Result Value Ref Range   MRSA by PCR NEGATIVE  NEGATIVE   Comment:            The GeneXpert MRSA Assay (FDA     approved for NASAL specimens     only), is one component of a     comprehensive MRSA colonization     surveillance program. It is not     intended to diagnose MRSA     infection nor to guide or     monitor treatment for     MRSA infections.  GLUCOSE, CAPILLARY     Status: Abnormal   Collection Time    04/26/14  1:13 AM      Result Value Ref Range   Glucose-Capillary 111 (*) 70 - 99 mg/dL  GLUCOSE, CAPILLARY     Status: Abnormal   Collection Time    04/26/14  3:38 AM      Result Value Ref Range   Glucose-Capillary 112 (*) 70 - 99 mg/dL   Comment 1 Documented in Chart     Comment 2 Notify RN    COMPREHENSIVE METABOLIC PANEL     Status: Abnormal   Collection Time    04/26/14  4:50 AM      Result Value Ref Range   Sodium 142  137 - 147 mEq/L   Potassium 3.6 (*) 3.7 - 5.3 mEq/L   Comment: DELTA CHECK NOTED   Chloride 111  96 - 112 mEq/L   CO2 22  19 - 32 mEq/L   Glucose, Bld  114 (*) 70 - 99 mg/dL   BUN 23  6 - 23 mg/dL   Creatinine, Ser 0.81  0.50 - 1.10 mg/dL   Calcium 8.5  8.4 - 10.5 mg/dL   Total Protein 4.5 (*) 6.0 - 8.3 g/dL   Albumin 1.4 (*) 3.5 - 5.2 g/dL   AST 26  0 - 37 U/L   ALT 21  0 - 35 U/L   Alkaline Phosphatase 173 (*) 39 - 117 U/L   Total Bilirubin 0.6  0.3 - 1.2 mg/dL   GFR calc non Af Amer 63 (*) >90 mL/min   GFR calc Af Amer 73 (*) >90 mL/min   Comment: (NOTE)     The eGFR has been calculated using the CKD EPI equation.     This calculation has not been validated in all clinical situations.     eGFR's persistently <90 mL/min signify possible Chronic Kidney     Disease.  CBC     Status: Abnormal   Collection Time    04/26/14  4:50 AM      Result Value Ref Range   WBC 9.9  4.0 - 10.5 K/uL   RBC 2.57 (*)  3.87 - 5.11 MIL/uL   Hemoglobin 7.8 (*) 12.0 - 15.0 g/dL   HCT 24.1 (*) 36.0 - 46.0 %   MCV 93.8  78.0 - 100.0 fL   MCH 30.4  26.0 - 34.0 pg   MCHC 32.4  30.0 - 36.0 g/dL   RDW 19.0 (*) 11.5 - 15.5 %   Platelets 244  150 - 400 K/uL   Comment: DELTA CHECK NOTED     REPEATED TO VERIFY  GLUCOSE, CAPILLARY     Status: Abnormal   Collection Time    04/26/14  7:12 AM      Result Value Ref Range   Glucose-Capillary 107 (*) 70 - 99 mg/dL  GLUCOSE, CAPILLARY     Status: None   Collection Time    04/26/14  9:59 AM      Result Value Ref Range   Glucose-Capillary 90  70 - 99 mg/dL   Comment 1 Documented in Chart     Comment 2 Notify RN    HEMOGLOBIN AND HEMATOCRIT, BLOOD     Status: None   Collection Time    04/26/14 10:04 AM      Result Value Ref Range   Hemoglobin 15.0  12.0 - 15.0 g/dL   Comment: DELTA CHECK NOTED     REPEATED TO VERIFY     POST OP   HCT 44.2  36.0 - 46.0 %  GLUCOSE, CAPILLARY     Status: None   Collection Time    04/26/14 11:47 AM      Result Value Ref Range   Glucose-Capillary 98  70 - 99 mg/dL  GLUCOSE, CAPILLARY     Status: Abnormal   Collection Time    04/26/14  3:44 PM      Result Value Ref Range    Glucose-Capillary 100 (*) 70 - 99 mg/dL  BASIC METABOLIC PANEL     Status: Abnormal   Collection Time    04/26/14  4:20 PM      Result Value Ref Range   Sodium 143  137 - 147 mEq/L   Potassium 4.0  3.7 - 5.3 mEq/L   Chloride 113 (*) 96 - 112 mEq/L   CO2 20  19 - 32 mEq/L   Glucose, Bld 86  70 - 99 mg/dL   BUN 23  6 - 23 mg/dL   Creatinine, Ser 0.73  0.50 - 1.10 mg/dL   Calcium 8.7  8.4 - 10.5 mg/dL   GFR calc non Af Amer 74 (*) >90 mL/min   GFR calc Af Amer 86 (*) >90 mL/min   Comment: (NOTE)     The eGFR has been calculated using the CKD EPI equation.     This calculation has not been validated in all clinical situations.     eGFR's persistently <90 mL/min signify possible Chronic Kidney     Disease.      Component Value Date/Time   SDES BLOOD HAND RIGHT 04/25/2014 1850   SPECREQUEST BOTTLES DRAWN AEROBIC AND ANAEROBIC 5CC 04/25/2014 1850   CULT  Value: GRAM POSITIVE COCCI IN CLUSTERS Note: Gram Stain Report Called to,Read Back By and Verified With: NICOLE Z BY INGRAM A 3PM 04/26/14 Performed at [facility] 04/25/2014 1850   REPTSTATUS PENDING 04/25/2014 1850   Dg Chest 1 View  04/25/2014   CLINICAL DATA:  infection  EXAM: CHEST - 1 VIEW  COMPARISON:  DG CHEST 1V PORT dated 04/03/2014  FINDINGS: The heart size and mediastinal contours are within normal limits. Both  lungs are clear. Degenerative changes are appreciated within the left shoulder.  IMPRESSION: No active disease.   Electronically Signed   By: Margaree Mackintosh M.D.   On: 04/25/2014 17:34   Dg Hip Complete Left  04/25/2014   CLINICAL DATA:  infection, pain  EXAM: LEFT HIP - COMPLETE 2+ VIEW  COMPARISON:  DG HIP PORTABLE 1 VIEW*L* dated 03/09/2014; DG HIP COMPLETE*L* dated 03/08/2014  FINDINGS: There is no evidence of hip fracture or dislocation. There is no evidence of arthropathy or other focal bone abnormality. The patient's total left hip arthroplasty is intact without evidence of loosening or failure. There is no  evidence of subcutaneous emphysema nor evidence of cortical irregularity.  IMPRESSION: Negative.   Electronically Signed   By: Margaree Mackintosh M.D.   On: 04/25/2014 17:33   Dg Pelvis Portable  04/26/2014   CLINICAL DATA:  postop  EXAM: PORTABLE PELVIS 1-2 VIEWS  COMPARISON:  CT ABD/PELVIS W CM dated 04/03/2014; CT HIP*L* W/O CM dated 03/08/2014  FINDINGS: The patient is status post left hip prostheses removal. Osteotomy changes along the base of the femoral neck. Multiple radiodense antibiotic beads are appreciated within the left femoral acetabular joint. Surgical drains are appreciated within the left hip. There is no evidence of fracture. The proximal femur is laterally displaced. Degenerative changes are appreciated within the lower lumbar spine and sacroiliac joints.  IMPRESSION: Postsurgical changes left hip. Otherwise no further acute osseous abnormalities.  Degenerative changes within the lower lumbar spine and sacroiliac joints.   Electronically Signed   By: Margaree Mackintosh M.D.   On: 04/26/2014 11:56   Dg Chest Port 1 View  04/26/2014   CLINICAL DATA:  Assess central line placement.  EXAM: PORTABLE CHEST - 1 VIEW  COMPARISON:  DG CHEST 1 VIEW dated 04/25/2014  FINDINGS: The patient has undergone placement of a left internal jugular venous catheter. The tip lies in the region of the distal SVC. There is no evidence of postprocedure pneumothorax or pneumothorax. The cardiopericardial silhouette is top-normal in size. The pulmonary vascularity is and not engorged. The lungs are adequately inflated and clear. There are degenerative changes of both shoulders.  IMPRESSION: There is no evidence of a postprocedure complication following placement of the left internal jugular venous catheter.   Electronically Signed   By: David  Martinique   On: 04/26/2014 01:24     Recent Results (from the past 720 hour(s))  CULTURE, BLOOD (ROUTINE X 2)     Status: None   Collection Time    04/03/14  5:42 AM      Result Value  Ref Range Status   Specimen Description BLOOD RIGHT UPPER ARM IV START   Final   Special Requests BOTTLES DRAWN AEROBIC AND ANAEROBIC 10CC EA   Final   Culture  Setup Time     Final   Value: 04/03/2014 08:40     Performed at [facility]   Culture     Final   Value: NO GROWTH 5 DAYS     Performed at [facility]   Report Status 04/09/2014 FINAL   Final  CULTURE, BLOOD (ROUTINE X 2)     Status: None   Collection Time    04/03/14  8:10 AM      Result Value Ref Range Status   Specimen Description BLOOD RIGHT HAND   Final   Special Requests     Final   Value: BOTTLES DRAWN AEROBIC AND ANAEROBIC 10CC BLUE 5CC RED  Culture  Setup Time     Final   Value: 04/03/2014 12:26     Performed at [facility]   Culture     Final   Value: NO GROWTH 5 DAYS     Performed at [facility]   Report Status 04/09/2014 FINAL   Final  WOUND CULTURE     Status: None   Collection Time    04/04/14 12:26 PM      Result Value Ref Range Status   Specimen Description WOUND HIP LEFT   Final   Special Requests Normal   Final   Gram Stain     Final   Value: MODERATE WBC PRESENT, PREDOMINANTLY PMN     RARE SQUAMOUS EPITHELIAL CELLS PRESENT     NO ORGANISMS SEEN     Performed at [facility]   Culture     Final   Value: FEW PSEUDOMONAS AERUGINOSA     Performed at [facility]   Report Status 04/07/2014 FINAL   Final   Organism ID, Bacteria PSEUDOMONAS AERUGINOSA   Final  CULTURE, BLOOD (ROUTINE X 2)     Status: None   Collection Time    04/25/14  6:50 PM      Result Value Ref Range Status   Specimen Description BLOOD HAND RIGHT   Final   Special Requests BOTTLES DRAWN AEROBIC AND ANAEROBIC 5CC   Final   Culture  Setup Time     Final   Value: 04/26/2014 00:54     Performed at [facility]   Culture     Final   Value: GRAM POSITIVE COCCI IN CLUSTERS     Note: Gram Stain Report Called to,Read Back By and Verified With: NICOLE Z BY INGRAM A 3PM  04/26/14     Performed at [facility]   Report Status PENDING   Incomplete  MRSA PCR SCREENING     Status: None   Collection Time    04/25/14 11:53 PM      Result Value Ref Range Status   MRSA by PCR NEGATIVE  NEGATIVE Final   Comment:            The GeneXpert MRSA Assay (FDA     approved for NASAL specimens     only), is one component of a     comprehensive MRSA colonization     surveillance program. It is not     intended to diagnose MRSA     infection nor to guide or     monitor treatment for     MRSA infections.     Impression/Recommendation  Active Problems:   Sepsis   Postoperative infection   Caylin Nass is a 80 y.o. female with  Dementia, hip fracture now with infected THA sp removal of hardware. She previously grew Ps. Aeruginsosa from the wound and now ith [facility] in 1/2 blood cultures from admission   #1 Infected THA sp removal of hardware: --agree with vancomycin and cefepime --fu intraoperative cultures, blood cultures   #2 [facility] in blood : if MRSA or MSSA will need to treat as true bacteremia even if only in 1/2 and all central lines will have to be removed , repeat blood cultures  #3. Dementia: may make caring for her difficult hopefully she can be distracted, restrained from pulling IV's out.   #4 Screening: will check for HIV and hep viruses despite pt being exceedingly low risk for either     04/26/2014, 7:23  PM   Thank you so much for this interesting consult  [place] [place] [phone] (pager) [phone] (office) 04/26/2014, 7:23 PM  [facility] 04/26/2014, 7:23 PM

## 2014-04-26 NOTE — Transfer of Care (Signed)
Immediate Anesthesia Transfer of Care Note  Patient: [name]  Procedure(s) Performed: Procedure(s): HARDWARE REMOVAL (Left) IRRIGATION AND DEBRIDEMENT HIP WITH ANTIBIOTIC BEAD INSERTION (Left)  Patient Location: PACU  Anesthesia Type:General  Level of Consciousness: patient cooperative and responds to stimulation  Airway & Oxygen Therapy: Patient Spontanous Breathing and Patient connected to face mask oxygen  Post-op Assessment: Report given to PACU RN and Post -op Vital signs reviewed and stable  Post vital signs: Reviewed and stable  Complications: No apparent anesthesia complications

## 2014-04-26 NOTE — Progress Notes (Signed)
Pt transported to pre-op via bed and accompanied by this RN. Report given to CRNA.  VSS.

## 2014-04-26 NOTE — Anesthesia Postprocedure Evaluation (Signed)
  Anesthesia Post-op Note  Patient: Michelle Yoder  Procedure(s) Performed: Procedure(s): HARDWARE REMOVAL (Left) IRRIGATION AND DEBRIDEMENT HIP WITH ANTIBIOTIC BEAD INSERTION (Left)  Patient Location: PACU  Anesthesia Type:General  Level of Consciousness: patient cooperative and confused  Airway and Oxygen Therapy: Patient Spontanous Breathing  Post-op Pain: mild  Post-op Assessment: Post-op Vital signs reviewed, Patient's Cardiovascular Status Stable, Respiratory Function Stable, Patent Airway, No signs of Nausea or vomiting and Pain level controlled  Post-op Vital Signs: Reviewed and stable  Last Vitals:  Filed Vitals:   04/26/14 1200  BP: 127/92  Pulse: 79  Temp: 36.2 C  Resp: 10    Complications: No apparent anesthesia complications

## 2014-04-26 NOTE — Anesthesia Preprocedure Evaluation (Signed)
Anesthesia Evaluation  Patient identified by MRN, date of birth, ID band Patient confused    Reviewed: Allergy & Precautions, H&P , NPO status , Patient's Chart, lab work & pertinent test results, reviewed documented beta blocker date and time   History of Anesthesia Complications Negative for: history of anesthetic complications  Airway Mallampati: II TM Distance: >3 FB     Dental  (+) Edentulous Upper   Pulmonary neg sleep apnea, neg COPDformer smoker,  breath sounds clear to auscultation        Cardiovascular hypertension, Pt. on medications and Pt. on home beta blockers - angina- Past MI, - CHF and - DOE Rhythm:Regular     Neuro/Psych CVA, Residual Symptoms negative psych ROS   GI/Hepatic Neg liver ROS, GERD-  Medicated and Controlled,  Endo/Other  diabetes, Type 2Chronic steroids  Renal/GU negative Renal ROS     Musculoskeletal  (+) Arthritis -, Rheumatoid disorders,    Abdominal   Peds  Hematology  (+) anemia ,   Anesthesia Other Findings   Reproductive/Obstetrics                           Anesthesia Physical Anesthesia Plan  ASA: III  Anesthesia Plan: General   Post-op Pain Management:    Induction: Intravenous  Airway Management Planned: Oral ETT  Additional Equipment: None  Intra-op Plan:   Post-operative Plan: Extubation in OR  Informed Consent: I have reviewed the patients History and Physical, chart, labs and discussed the procedure including the risks, benefits and alternatives for the proposed anesthesia with the patient or authorized representative who has indicated his/her understanding and acceptance.   Dental advisory given  Plan Discussed with: CRNA and Surgeon  Anesthesia Plan Comments:         Anesthesia Quick Evaluation

## 2014-04-27 DIAGNOSIS — F0391 Unspecified dementia with behavioral disturbance: Secondary | ICD-10-CM

## 2014-04-27 DIAGNOSIS — IMO0002 Reserved for concepts with insufficient information to code with codable children: Secondary | ICD-10-CM

## 2014-04-27 DIAGNOSIS — Z96649 Presence of unspecified artificial hip joint: Secondary | ICD-10-CM

## 2014-04-27 DIAGNOSIS — A4101 Sepsis due to Methicillin susceptible Staphylococcus aureus: Secondary | ICD-10-CM | POA: Diagnosis present

## 2014-04-27 DIAGNOSIS — F03918 Unspecified dementia, unspecified severity, with other behavioral disturbance: Secondary | ICD-10-CM | POA: Diagnosis present

## 2014-04-27 DIAGNOSIS — T8459XA Infection and inflammatory reaction due to other internal joint prosthesis, initial encounter: Secondary | ICD-10-CM

## 2014-04-27 DIAGNOSIS — S7290XA Unspecified fracture of unspecified femur, initial encounter for closed fracture: Secondary | ICD-10-CM

## 2014-04-27 DIAGNOSIS — S72009A Fracture of unspecified part of neck of unspecified femur, initial encounter for closed fracture: Secondary | ICD-10-CM

## 2014-04-27 DIAGNOSIS — E119 Type 2 diabetes mellitus without complications: Secondary | ICD-10-CM

## 2014-04-27 LAB — COMPREHENSIVE METABOLIC PANEL
ALBUMIN: 1.4 g/dL — AB (ref 3.5–5.2)
ALK PHOS: 211 U/L — AB (ref 39–117)
ALT: 25 U/L (ref 0–35)
AST: 36 U/L (ref 0–37)
BUN: 21 mg/dL (ref 6–23)
CALCIUM: 9.1 mg/dL (ref 8.4–10.5)
CO2: 18 mEq/L — ABNORMAL LOW (ref 19–32)
CREATININE: 0.74 mg/dL (ref 0.50–1.10)
Chloride: 112 mEq/L (ref 96–112)
GFR calc non Af Amer: 74 mL/min — ABNORMAL LOW (ref >90)
GFR, EST AFRICAN AMERICAN: 85 mL/min — AB (ref >90)
GLUCOSE: 65 mg/dL — AB (ref 70–99)
POTASSIUM: 4.1 meq/L (ref 3.7–5.3)
Sodium: 142 mEq/L (ref 137–147)
Total Bilirubin: 0.7 mg/dL (ref 0.3–1.2)
Total Protein: 5.2 g/dL — ABNORMAL LOW (ref 6.0–8.3)

## 2014-04-27 LAB — CBC
HEMATOCRIT: 37.6 % (ref 36.0–46.0)
Hemoglobin: 12.8 g/dL (ref 12.0–15.0)
MCH: 30.5 pg (ref 26.0–34.0)
MCHC: 34 g/dL (ref 30.0–36.0)
MCV: 89.7 fL (ref 78.0–100.0)
Platelets: 228 10*3/uL (ref 150–400)
RBC: 4.19 MIL/uL (ref 3.87–5.11)
RDW: 19.5 % — ABNORMAL HIGH (ref 11.5–15.5)
WBC: 11.6 10*3/uL — AB (ref 4.0–10.5)

## 2014-04-27 LAB — HEPATITIS PANEL, ACUTE
HCV AB: NEGATIVE
HEP A IGM: NONREACTIVE
HEP B C IGM: NONREACTIVE
HEP B S AG: NEGATIVE

## 2014-04-27 LAB — GLUCOSE, CAPILLARY
GLUCOSE-CAPILLARY: 90 mg/dL (ref 70–99)
GLUCOSE-CAPILLARY: 82 mg/dL (ref 70–99)
GLUCOSE-CAPILLARY: 99 mg/dL (ref 70–99)
Glucose-Capillary: 57 mg/dL — ABNORMAL LOW (ref 70–99)
Glucose-Capillary: 65 mg/dL — ABNORMAL LOW (ref 70–99)
Glucose-Capillary: 85 mg/dL (ref 70–99)
Glucose-Capillary: 85 mg/dL (ref 70–99)
Glucose-Capillary: 97 mg/dL (ref 70–99)

## 2014-04-27 LAB — PROCALCITONIN: Procalcitonin: 2.99 ng/mL

## 2014-04-27 LAB — SEDIMENTATION RATE: Sed Rate: 70 mm/hr — ABNORMAL HIGH (ref 0–22)

## 2014-04-27 LAB — C-REACTIVE PROTEIN: CRP: 34.7 mg/dL — ABNORMAL HIGH (ref <0.60)

## 2014-04-27 MED ORDER — GLUCERNA SHAKE PO LIQD
237.0000 mL | Freq: Two times a day (BID) | ORAL | Status: DC
  Administered 2014-04-28 – 2014-05-01 (×7): 237 mL via ORAL

## 2014-04-27 MED ORDER — CEFAZOLIN SODIUM-DEXTROSE 2-3 GM-% IV SOLR
2.0000 g | Freq: Three times a day (TID) | INTRAVENOUS | Status: DC
  Administered 2014-04-27 – 2014-05-02 (×16): 2 g via INTRAVENOUS
  Filled 2014-04-27 (×19): qty 50

## 2014-04-27 NOTE — Progress Notes (Signed)
Clinical Social Work Department BRIEF PSYCHOSOCIAL ASSESSMENT 04/27/2014  Patient:  Michelle Yoder, Michelle Yoder     Account Number:  [id number]     Admit date:  04/25/2014  Clinical Social Worker:  Valda Lamb  Date/Time:  04/27/2014 12:05 PM  Referred by:  Physician  Date Referred:  04/27/2014 Referred for  SNF Placement   Other Referral:   Interview type:  Family Other interview type:   CSW completed assessment with pt daughter Ruel Favors [phone]    PSYCHOSOCIAL DATA Living Status:  FACILITY Admitted from facility:  [facility] Level of care:  Davy Primary support name:  Ruel Favors Primary support relationship to patient:  CHILD, ADULT Degree of support available:   Pt daughter appears actively involved in pt care.    CURRENT CONCERNS Current Concerns  Post-Acute Placement   Other Concerns:    SOCIAL WORK ASSESSMENT / PLAN Covering CSW informed that pt was admitted from [facility].    Pt presents with AMS, CSW completed assessment with pt daughter [name] over the phone. [name] confirmed that pt was admitted from [facility] and the plan would be for pt to return when medically ready.    CSW contacted Christine at [facility] and confirmed that pt is doing a bed hold and pt is able to return when medically ready.   Assessment/plan status:  Psychosocial Support/Ongoing Assessment of Needs Other assessment/ plan:   Information/referral to community resources:   Daughter also expressed frustration with [name]- [facility] facility pt previously went. She informed CSW that the facility needed to be removed from our lists. CSW validated daughters feelings and encouraged her to contact the [facility] with her concerns.    PATIENT'S/FAMILY'S RESPONSE TO PLAN OF CARE: Pt presented confused, assessment completed with daughter. Daughter agreeable to having pt return to [facility] when medically ready.       Hunt Oris, MSW, [place]

## 2014-04-27 NOTE — Evaluation (Signed)
Occupational Therapy Evaluation Patient Details Name: Oneal Biglow MRN: [id number] DOB: [birth year] Today's Date: 04/27/2014    History of Present Illness 80 yo female admitted from SNF [facility] in [place] with Sepsis ( tachycardia, fever) and underwent 4/29 I& D with hardware removal to Lt HIp. Pt with x2 recent admissions. Pt was able to walk with cane and walker but sustained fall in [place] , [place] living at ALF. Pt underwent surg 3/15 for LT THA. Pt d/c from [facility] to [facility] SNF 3/18. Pt readmitted on 4/5 from [facility] s/p fall from w/c SIRS fever delirium. Pt with recent passing of her husband. PT d/c from [facility] to [facility] [place] SNF 4/10. hx of mild dementia, RA, HTN, DM Gout . Per progress notes: daughter notes visual changes since 3/15 surg   Clinical Impression   Patient is s/p hardware removal and I&D Lt hip surgery 04/26/14 resulting in functional limitations due to the deficits listed below (see OT problem list).  Patient will benefit from skilled OT acutely to increase independence and safety with ADLS to allow discharge SNF.     Follow Up Recommendations  SNF;Supervision/Assistance - 24 hour    Equipment Recommendations  Hospital bed;Other (comment) (hoyer lift)    Recommendations for Other Services       Precautions / Restrictions Precautions Precautions: Fall;Posterior Hip Precaution Comments: Drain and suction to wall as wound vac starting 4/29 and to sustain wound vac suction for 7-10 days per chart Required Braces or Orthoses: Knee Immobilizer - Left Knee Immobilizer - Left: On at all times Restrictions Weight Bearing Restrictions: Yes LLE Weight Bearing: Non weight bearing      Mobility Bed Mobility Overal bed mobility: +2 for physical assistance;+ 2 for safety/equipment;Needs Assistance Bed Mobility: Sit to Supine;Supine to Sit     Supine to sit: Total assist;+2 for physical assistance;+2 for safety/equipment Sit to supine: Total assist;+2 for  physical assistance;+2 for safety/equipment   General bed mobility comments: Pt requires total (A) for mobility with pad use. Pt reports my leg hurts with sitting position. Pt with poor sitting balance see below  Transfers                 General transfer comment: not assessed    Balance Overall balance assessment: Needs assistance;History of Falls Sitting-balance support: Feet supported;Single extremity supported (Rt) Sitting balance-Leahy Scale: Zero Sitting balance - Comments: Pt required (A) due to posterior lean. pt reaching with RT UE to help with Hip flexion in sitting. pt with incr posterior lean as pain incr and verbal request to return supine.  Postural control: Posterior lean                                  ADL Overall ADL's : Needs assistance/impaired                                       General ADL Comments: pt currently requires total (A) for all adls. pt is NWB LT LE and will require bed pan due to injury and cognitive deficits. pt is high risk for skin break down due to decr mobilty and decr cognition     Vision                 Additional Comments: difficult to fully assess vision due to cognition. pt attending  to teh Rt side. Pt closing Lt eye but does demonstrate ability to open several times during session. pt not tracking or following commands to fully assesss   Perception Perception Perception Tested?: Yes Perception Deficits: Inattention/neglect Inattention/Neglect: Does not attend to left visual field;Does not attend to left side of body   Praxis Praxis Praxis tested?: Deficits Deficits: Perseveration    Pertinent Vitals/Pain Calling out with eob sitting and reports incr pain. Unable to rate decr pain and comfort with return to supine     Hand Dominance Right   Extremity/Trunk Assessment Upper Extremity Assessment Upper Extremity Assessment: RUE deficits/detail;LUE deficits/detail;Difficult to assess  due to impaired cognition RUE Deficits / Details: RA hx, AROM ~80 degrees shoulder flexion, decr shoulder abduction ~50 degrees LUE Deficits / Details: RA hx, AROM ~80 degrees shoulder flexion, decr shoulder abduction ~50 degrees   Lower Extremity Assessment Lower Extremity Assessment: Defer to PT evaluation   Cervical / Trunk Assessment Cervical / Trunk Assessment: Kyphotic   Communication Communication Communication: HOH   Cognition Arousal/Alertness: Awake/alert Behavior During Therapy: Restless;Flat affect (easily irritable ) Overall Cognitive Status: History of cognitive impairments - at baseline       Memory: Decreased short-term memory;Decreased recall of precautions             General Comments       Exercises       Shoulder Instructions      Home Living Family/patient expects to be discharged to:: Skilled nursing facility                                 Additional Comments: Pt is from [place]. [facility]eviously at [facility] and daughter does not like facility per chart      Prior Functioning/Environment Level of Independence: Needs assistance    ADL's / Homemaking Assistance Needed: total (A)        OT Diagnosis: Generalized weakness;Cognitive deficits;Disturbance of vision;Acute pain   OT Problem List: Decreased strength;Decreased activity tolerance;Impaired balance (sitting and/or standing);Impaired vision/perception;Decreased coordination;Decreased cognition;Decreased safety awareness;Decreased knowledge of precautions;Decreased knowledge of use of DME or AE;Cardiopulmonary status limiting activity;Obesity;Pain   OT Treatment/Interventions: Self-care/ADL training;Therapeutic exercise;DME and/or AE instruction;Therapeutic activities;Cognitive remediation/compensation;Visual/perceptual remediation/compensation;Patient/family education;Balance training    OT Goals(Current goals can be found in the care plan section) Acute Rehab  OT Goals Patient Stated Goal: None stated OT Goal Formulation: Patient unable to participate in goal setting Time For Goal Achievement: 05/11/14 Potential to Achieve Goals: Fair  OT Frequency: Min 2X/week   Barriers to D/C:            Co-evaluation PT/OT/SLP Co-Evaluation/Treatment: Yes Reason for Co-Treatment: Complexity of the patient's impairments (multi-system involvement);For patient/therapist safety   OT goals addressed during session: ADL's and self-care      End of Session Nurse Communication: Mobility status;Precautions;Need for lift equipment;Weight bearing status  Activity Tolerance: Patient limited by pain Patient left: in bed;with call bell/phone within reach   Time: 1035-1101 OT Time Calculation (min): 26 min Charges:  OT General Charges $OT Visit: 1 Procedure OT Evaluation $Initial OT Evaluation Tier I: 1 Procedure OT Treatments $Therapeutic Activity: 8-22 mins G-Codes:    Peri Maris 05-11-14, 11:42 AM Pager: [phone]

## 2014-04-27 NOTE — Progress Notes (Signed)
[facility]  Day # 3 vancomycin and cefepime  Subjective: Pt is delirious "whats that?"   Antibiotics:  Anti-infectives   Start     Dose/Rate Route Frequency Ordered Stop   04/27/14 0930  ceFAZolin (ANCEF) IVPB 2 g/50 mL premix     2 g 100 mL/hr over 30 Minutes Intravenous 3 times per day 04/27/14 0920     04/26/14 1400  ceFEPIme (MAXIPIME) 1 g in dextrose 5 % 50 mL IVPB  Status:  Discontinued     1 g 100 mL/hr over 30 Minutes Intravenous Every 12 hours 04/26/14 1358 04/27/14 0920   04/26/14 1145  ceFAZolin (ANCEF) IVPB 2 g/50 mL premix  Status:  Discontinued     2 g 100 mL/hr over 30 Minutes Intravenous Every 6 hours 04/26/14 1142 04/26/14 1152   04/26/14 0916  gentamicin (GARAMYCIN) injection  Status:  Discontinued       As needed 04/26/14 0917 04/26/14 0947   04/26/14 0916  vancomycin (VANCOCIN) powder  Status:  Discontinued       As needed 04/26/14 0919 04/26/14 0947   04/25/14 2200  vancomycin (VANCOCIN) 500 mg in sodium chloride 0.9 % 100 mL IVPB     500 mg 100 mL/hr over 60 Minutes Intravenous Every 12 hours 04/25/14 2106     04/25/14 2200  ceFEPIme (MAXIPIME) 1 g in dextrose 5 % 50 mL IVPB  Status:  Discontinued     1 g 100 mL/hr over 30 Minutes Intravenous Every 24 hours 04/25/14 2106 04/26/14 1358   04/25/14 1630  vancomycin (VANCOCIN) IVPB 1000 mg/200 mL premix  Status:  Discontinued     1,000 mg 200 mL/hr over 60 Minutes Intravenous  Once 04/25/14 1626 04/25/14 1758   04/25/14 1630  ceFEPIme (MAXIPIME) 1 g in dextrose 5 % 50 mL IVPB  Status:  Discontinued     1 g 100 mL/hr over 30 Minutes Intravenous  Once 04/25/14 1626 04/25/14 1758      Medications: Scheduled Meds: . aspirin EC  325 mg Oral Q breakfast  .  ceFAZolin (ANCEF) IV  2 g Intravenous 3 times per day  . [START ON 04/28/2014] feeding supplement (GLUCERNA SHAKE)  237 mL Oral BID BM  . insulin aspart  0-9 Units Subcutaneous 6 times per day  . vancomycin  500 mg Intravenous  Q12H   Continuous Infusions: . sodium chloride 125 mL/hr at 04/27/14 1015   PRN Meds:.acetaminophen, haloperidol lactate, HYDROcodone-acetaminophen, menthol-cetylpyridinium, morphine injection, phenol    Objective: Weight change:   Intake/Output Summary (Last 24 hours) at 04/27/14 1506 Last data filed at 04/27/14 1015  Gross per 24 hour  Intake 3756.25 ml  Output    650 ml  Net 3106.25 ml   Blood pressure 125/73, pulse 105, temperature 98.6 F (37 C), temperature source Other (Comment), resp. rate 13, height 5' 4.17" (1.63 m), weight 138 lb 10.7 oz (62.9 kg), SpO2 94.00%. Temp:  [97.5 F (36.4 C)-100 F (37.8 C)] 98.6 F (37 C) (04/30 0800) Pulse Rate:  [75-129] 105 (04/30 1000) Resp:  [11-20] 13 (04/30 1000) BP: (100-174)/(44-91) 125/73 mmHg (04/30 0800) SpO2:  [91 %-97 %] 94 % (04/30 1000)  Physical Exam: General: Alert and awake delirious and restrained  HEENT: anicteric sclera,EOMI,  CVS regular rate, normal r, no murmur rubs or gallops  Chest: clear to auscultation bilaterally, no wheezing, rales or rhonchi  Abdomen: soft nontender, nondistended, normal bowel sounds,  Extremities: hip bandage in place  Skin: no  rash  Neuro: nonfocal, strength and sensation intact   CBC:  Recent Labs Lab 04/25/14 1543 04/26/14 0450 04/26/14 1004 04/27/14 0310  HGB 6.5* 7.8* 15.0 12.8  HCT 20.1* 24.1* 44.2 37.6  PLT 402* 244  --  228  INR 1.07  --   --   --   APTT 29  --   --   --      BMET  Recent Labs  04/26/14 1620 04/27/14 0310  NA 143 142  K 4.0 4.1  CL 113* 112  CO2 20 18*  GLUCOSE 86 65*  BUN 23 21  CREATININE 0.73 0.74  CALCIUM 8.7 9.1     Liver Panel   Recent Labs  04/26/14 0450 04/27/14 0310  PROT 4.5* 5.2*  ALBUMIN 1.4* 1.4*  AST 26 36  ALT 21 25  ALKPHOS 173* 211*  BILITOT 0.6 0.7       Sedimentation Rate  Recent Labs  04/27/14 0310  ESRSEDRATE 70*   C-Reactive Protein  Recent Labs  04/27/14 0310  CRP 34.7*     Micro Results: Recent Results (from the past 240 hour(s))  CULTURE, BLOOD (ROUTINE X 2)     Status: None   Collection Time    04/25/14  6:50 PM      Result Value Ref Range Status   Specimen Description BLOOD HAND RIGHT   Final   Special Requests BOTTLES DRAWN AEROBIC AND ANAEROBIC 5CC   Final   Culture  Setup Time     Final   Value: 04/26/2014 00:54     Performed at [facility]   Culture     Final   Value: STAPHYLOCOCCUS AUREUS     Note: RIFAMPIN AND GENTAMICIN SHOULD NOT BE USED AS SINGLE DRUGS FOR TREATMENT OF STAPH INFECTIONS.     Note: Gram Stain Report Called to,Read Back By and Verified With: NICOLE Z BY INGRAM A 3PM 04/26/14     Performed at [facility]   Report Status PENDING   Incomplete  URINE CULTURE     Status: None   Collection Time    04/25/14  7:26 PM      Result Value Ref Range Status   Specimen Description URINE, CATHETERIZED   Final   Special Requests Normal   Final   Culture  Setup Time     Final   Value: 04/26/2014 01:02     Performed at [facility]     Final   Value: NO GROWTH     Performed at [facility]   Culture     Final   Value: NO GROWTH     Performed at [facility]   Report Status 04/26/2014 FINAL   Final  MRSA PCR SCREENING     Status: None   Collection Time    04/25/14 11:53 PM      Result Value Ref Range Status   MRSA by PCR NEGATIVE  NEGATIVE Final   Comment:            The GeneXpert MRSA Assay (FDA     approved for NASAL specimens     only), is one component of a     comprehensive MRSA colonization     surveillance program. It is not     intended to diagnose MRSA     infection nor to guide or     monitor treatment for     MRSA infections.  CULTURE, BLOOD (ROUTINE X  2)     Status: None   Collection Time    04/26/14  1:10 AM      Result Value Ref Range Status   Specimen Description BLOOD CENTRAL LINE   Final   Special Requests     Final   Value: BOTTLES DRAWN AEROBIC  AND ANAEROBIC AER 10CC ANA 2CC   Culture  Setup Time     Final   Value: 04/26/2014 08:00     Performed at [facility]   Culture     Final   Value:        BLOOD CULTURE RECEIVED NO GROWTH TO DATE CULTURE WILL BE HELD FOR 5 DAYS BEFORE ISSUING A FINAL NEGATIVE REPORT     Performed at [facility]   Report Status PENDING   Incomplete  ANAEROBIC CULTURE     Status: None   Collection Time    04/26/14  8:58 AM      Result Value Ref Range Status   Specimen Description TISSUE HIP LEFT   Final   Special Requests LEFT DEEP TISSUE PT ON VANCO   Final   Gram Stain PENDING   Incomplete   Culture     Final   Value: NO ANAEROBES ISOLATED; CULTURE IN PROGRESS FOR 5 DAYS     Performed at [facility]   Report Status PENDING   Incomplete  TISSUE CULTURE     Status: None   Collection Time    04/26/14  8:58 AM      Result Value Ref Range Status   Specimen Description TISSUE HIP LEFT   Final   Special Requests LEFT DEEP TISSUE PT ON VANCO   Final   Gram Stain     Final   Value: ABUNDANT WBC PRESENT, PREDOMINANTLY PMN     NO SQUAMOUS EPITHELIAL CELLS SEEN     RARE GRAM POSITIVE COCCI     IN PAIRS     Performed at [facility]   Culture     Final   Value: FEW STAPHYLOCOCCUS AUREUS     Note: RIFAMPIN AND GENTAMICIN SHOULD NOT BE USED AS SINGLE DRUGS FOR TREATMENT OF STAPH INFECTIONS.     Performed at [facility]   Report Status PENDING   Incomplete    Studies/Results: Dg Chest 1 View  04/25/2014   CLINICAL DATA:  infection  EXAM: CHEST - 1 VIEW  COMPARISON:  DG CHEST 1V PORT dated 04/03/2014  FINDINGS: The heart size and mediastinal contours are within normal limits. Both lungs are clear. Degenerative changes are appreciated within the left shoulder.  IMPRESSION: No active disease.   Electronically Signed   By: Margaree Mackintosh M.D.   On: 04/25/2014 17:34   Dg Hip Complete Left  04/25/2014   CLINICAL DATA:  infection, pain  EXAM: LEFT HIP - COMPLETE 2+ VIEW   COMPARISON:  DG HIP PORTABLE 1 VIEW*L* dated 03/09/2014; DG HIP COMPLETE*L* dated 03/08/2014  FINDINGS: There is no evidence of hip fracture or dislocation. There is no evidence of arthropathy or other focal bone abnormality. The patient's total left hip arthroplasty is intact without evidence of loosening or failure. There is no evidence of subcutaneous emphysema nor evidence of cortical irregularity.  IMPRESSION: Negative.   Electronically Signed   By: Margaree Mackintosh M.D.   On: 04/25/2014 17:33   Dg Pelvis Portable  04/26/2014   CLINICAL DATA:  postop  EXAM: PORTABLE PELVIS 1-2 VIEWS  COMPARISON:  CT ABD/PELVIS W CM dated  04/03/2014; CT HIP*L* W/O CM dated 03/08/2014  FINDINGS: The patient is status post left hip prostheses removal. Osteotomy changes along the base of the femoral neck. Multiple radiodense antibiotic beads are appreciated within the left femoral acetabular joint. Surgical drains are appreciated within the left hip. There is no evidence of fracture. The proximal femur is laterally displaced. Degenerative changes are appreciated within the lower lumbar spine and sacroiliac joints.  IMPRESSION: Postsurgical changes left hip. Otherwise no further acute osseous abnormalities.  Degenerative changes within the lower lumbar spine and sacroiliac joints.   Electronically Signed   By: Margaree Mackintosh M.D.   On: 04/26/2014 11:56   Dg Chest Port 1 View  04/26/2014   CLINICAL DATA:  Assess central line placement.  EXAM: PORTABLE CHEST - 1 VIEW  COMPARISON:  DG CHEST 1 VIEW dated 04/25/2014  FINDINGS: The patient has undergone placement of a left internal jugular venous catheter. The tip lies in the region of the distal SVC. There is no evidence of postprocedure pneumothorax or pneumothorax. The cardiopericardial silhouette is top-normal in size. The pulmonary vascularity is and not engorged. The lungs are adequately inflated and clear. There are degenerative changes of both shoulders.  IMPRESSION: There is no  evidence of a postprocedure complication following placement of the left internal jugular venous catheter.   Electronically Signed   By: David  Martinique   On: 04/26/2014 01:24      Assessment/Plan:  Active Problems:   Sepsis   Postoperative infection   Staphylococcus aureus bacteremia with sepsis   Infected prosthetic hip   Dementia with behavioral disturbance    Michelle Yoder is a 80 y.o. female with  Dementia hip fracture, prosthetic hip infection that with purulent drainge from wound several weeks ago with Pseudomonas, now with Staph Aureus bacteremia with sepsis and THA infection with S. Aureus sp removal of hardware  #1      [place] [facility] Staphylococcus aureus bacteremia   Staphylococcus aureus bacteremia (SAB) is associated with a high rate of complications and mortality.  Specific aspects of clinical management are critical to optimizing the outcome of patients with SAB.  Therefore, the [facility] Antimicrobial Management Team [facility]) has initiated an intervention aimed at improving the management of SAB at [facility].  To do so, Infectious Diseases physicians are providing an evidence-based consult for the management of all patients with SAB.     Yes No Comments  Perform follow-up blood cultures (even if the patient is afebrile) to ensure clearance of bacteremia [x]  []  Repeat blood cultures obtained 04/26/14 NGTD  Remove vascular catheter and obtain follow-up blood cultures after the removal of the catheter []  []  Patient had TRIPLE LUMEN inserted on 04/26/14 it appears after repeat blood cultures obtained, remains to be seen if this might be a contaminated line (ie if 04/26/14 cx = + then it has to come out  Perform echocardiography to evaluate for endocarditis (transthoracic ECHO is 40-50% sensitive, TEE is > 90% sensitive) [x]  []  Please keep in mind, that neither test can definitively EXCLUDE endocarditis, and that should clinical suspicion remain high  for endocarditis the patient should then still be treated with an "endocarditis" duration of therapy = 6 weeks  2 D ECHO ordered would not do TEE  Since duration would be 6-8 weeks regardless  Consult electrophysiologist to evaluate implanted cardiac device (pacemaker, ICD) []  []  NA  Ensure source control []  []  Have all abscesses been drained effectively? Have deep seeded infections (septic joints or  osteomyelitis) had appropriate surgical debridement?  SHE HAD ALL HARDWARE REMOVED FROM HER HIP WITH ID AND D  Investigate for "metastatic" sites of infection []  []  Does the patient have ANY symptom or physical exam finding that would suggest a deeper infection (back or neck pain that may be suggestive of vertebral osteomyelitis or epidural abscess, muscle pain that could be a symptom of pyomyositis)?  Keep in mind that for deep seeded infections MRI imaging with contrast is preferred rather than other often insensitive tests such as plain x-rays, especially early in a patient's presentation. WITH PTS DEMENTIA IMPOSSIBLE TO KNOW AT PRESENT  Change antibiotic therapy to [place] BETTER  CNS PENETRATION IF THIS IS MSSA (ANCEF NOT GOOD AT PENETRATING CSFTO DOSE) ROCEPHIN COULD ALSO BE CONSIDERED IF CNS PENETRATION WOULD BE NEEDED, IE IF SHE HAS ENDOCARDITIS WITH SEPTIC EMBOLI TO CNS. I THINK FOR NOW GO WITH ANCEF AND VANCO [x]  []  Beta-lactam antibiotics are preferred for MSSA due to higher cure rates.   If on Vancomycin, goal trough should be 15 - 20 mcg/mL  Estimated duration of IV antibiotic therapy:  6-8 WEEKS []  []  Consult case management for probably prolonged outpatient IV antibiotic therapy    #2 THA INFECTION SEE ABOVE, she had also previously grown ps. aeruginosas but not from DEEP cutlures from where she is growing S.A  #3 Screening: hep panel negative and HIV pending  #4 Goals of care: SAB has a 20% mortality ALL comers and I am unfortunately  confident will exact  A significant toll on this demented patient. We are happy to provider her aggressive IV antibiotics and she has received proper aggressive surgical treatment that together with abx can still effect microbiological cure. However I DO think that it would be helpful to involve palliative care re goals of care in this patient given her underlying dementia and given her critical illlness  Dr. Johnnye Sima takes over service tomorrow.    LOS: 2 days   Truman Hayward 04/27/2014, 3:06 PM

## 2014-04-27 NOTE — Consult Note (Signed)
PULMONARY / CRITICAL CARE MEDICINE   Name: Michelle Yoder MRN: [id number] DOB: 12-30-1922    ADMISSION DATE:  04/25/2014 CONSULTATION DATE:  04/26/2014  REFERRING MD :  Ernestina Patches PRIMARY SERVICE: Family  CHIEF COMPLAINT:  Fever  BRIEF PATIENT DESCRIPTION: 80 y.o. F s/p left hip hemiarthroplasty 03/15, has had persistent drainage from surgical site x 4 weeks.  Pt brought in for altered mental status and deterioration in function, found to be septic.  Admitted by family medicine.  Developed fever and tachycardia 4/29, PCCM consulted.  SIGNIFICANT EVENTS / STUDIES:  3/15 left hip hemiarthroplasty 4/28 admitted 4/29 developed fever (102.2 max), tachycardia - PCCM consulted.  LINES / TUBES: Left IJ TLC 4/29 >>>  CULTURES: Urine 4/28 >>> Blood 4/28 >>> Wound (left hip) 4/28 >>>  ANTIBIOTICS: Vanc 4/28 >>> Cefepime 4/28 >>>  SUBJECTIVE:   Extubated post op, protecting airway, low UOP overnight responding to IVF.  VITAL SIGNS: Temp:  [96.3 F (35.7 C)-100 F (37.8 C)] 99.8 F (37.7 C) (04/30 0400) Pulse Rate:  [66-129] 129 (04/30 0400) Resp:  [10-18] 16 (04/30 0400) BP: (95-174)/(46-105) 149/67 mmHg (04/30 0400) SpO2:  [93 %-100 %] 95 % (04/30 0400) Weight:  [138 lb 10.7 oz (62.9 kg)] 138 lb 10.7 oz (62.9 kg) (04/29 0600) HEMODYNAMICS:   VENTILATOR SETTINGS:   INTAKE / OUTPUT: Intake/Output     04/29 0701 - 04/30 0700   I.V. (mL/kg) 2950 (46.9)   Blood 624   IV Piggyback 1200   Total Intake(mL/kg) 4774 (75.9)   Urine (mL/kg/hr) 395 (0.3)   Drains 160 (0.1)   Blood 100 (0.1)   Total Output 655   Net +4119        PHYSICAL EXAMINATION: General: Elderly female, resting in bed, in NAD. Neuro: Demented, moaning/groaning. HEENT: [place]/AT. PERRL, sclerae anicteric.  MM dry. Cardiovascular: RRR, no M/R/G.  Lungs: Respirations even and unlabored.  CTA bilaterally, No W/R/R.  Abdomen: BS x 4, soft, NT/ND.  Musculoskeletal: No gross deformities, no edema. Left hip incision  with mild drainage, yellowish in color.  Left hip tender to palpation. Skin: Intact, warm, no rashes.  Left hip incision drainage as above.  LABS:  CBC  Recent Labs Lab 04/25/14 1543 04/26/14 0450 04/26/14 1004 04/27/14 0310  WBC 15.5* 9.9  --  11.6*  HGB 6.5* 7.8* 15.0 12.8  HCT 20.1* 24.1* 44.2 37.6  PLT 402* 244  --  228   Coag's  Recent Labs Lab 04/25/14 1543  APTT 29  INR 1.07   BMET  Recent Labs Lab 04/26/14 0450 04/26/14 1620 04/27/14 0310  NA 142 143 142  K 3.6* 4.0 4.1  CL 111 113* 112  CO2 22 20 18*  BUN 23 23 21   CREATININE 0.81 0.73 0.74  GLUCOSE 114* 86 65*   Electrolytes  Recent Labs Lab 04/26/14 0450 04/26/14 1620 04/27/14 0310  CALCIUM 8.5 8.7 9.1   Sepsis Markers  Recent Labs Lab 04/25/14 1543 04/25/14 1907 04/27/14 0310  LATICACIDVEN 1.7 1.86  --   PROCALCITON 1.35  --  2.99   ABG No results found for this basename: PHART, PCO2ART, PO2ART,  in the last 168 hours Liver Enzymes  Recent Labs Lab 04/25/14 1543 04/26/14 0450 04/27/14 0310  AST 43* 26 36  ALT 28 21 25   ALKPHOS 250* 173* 211*  BILITOT 0.7 0.6 0.7  ALBUMIN 1.7* 1.4* 1.4*   Cardiac Enzymes  Recent Labs Lab 04/25/14 1543  TROPONINI <0.30  PROBNP 4705.0*   Glucose  Recent Labs Lab  04/26/14 1544 04/26/14 1925 04/26/14 2347 04/27/14 0331 04/27/14 0405 04/27/14 0442  GLUCAP 100* 72 85 57* 65* 90   Imaging Dg Chest 1 View  04/25/2014   CLINICAL DATA:  infection  EXAM: CHEST - 1 VIEW  COMPARISON:  DG CHEST 1V PORT dated 04/03/2014  FINDINGS: The heart size and mediastinal contours are within normal limits. Both lungs are clear. Degenerative changes are appreciated within the left shoulder.  IMPRESSION: No active disease.   Electronically Signed   By: Margaree Mackintosh M.D.   On: 04/25/2014 17:34   Dg Hip Complete Left  04/25/2014   CLINICAL DATA:  infection, pain  EXAM: LEFT HIP - COMPLETE 2+ VIEW  COMPARISON:  DG HIP PORTABLE 1 VIEW*L* dated 03/09/2014;  DG HIP COMPLETE*L* dated 03/08/2014  FINDINGS: There is no evidence of hip fracture or dislocation. There is no evidence of arthropathy or other focal bone abnormality. The patient's total left hip arthroplasty is intact without evidence of loosening or failure. There is no evidence of subcutaneous emphysema nor evidence of cortical irregularity.  IMPRESSION: Negative.   Electronically Signed   By: Margaree Mackintosh M.D.   On: 04/25/2014 17:33   Dg Pelvis Portable  04/26/2014   CLINICAL DATA:  postop  EXAM: PORTABLE PELVIS 1-2 VIEWS  COMPARISON:  CT ABD/PELVIS W CM dated 04/03/2014; CT HIP*L* W/O CM dated 03/08/2014  FINDINGS: The patient is status post left hip prostheses removal. Osteotomy changes along the base of the femoral neck. Multiple radiodense antibiotic beads are appreciated within the left femoral acetabular joint. Surgical drains are appreciated within the left hip. There is no evidence of fracture. The proximal femur is laterally displaced. Degenerative changes are appreciated within the lower lumbar spine and sacroiliac joints.  IMPRESSION: Postsurgical changes left hip. Otherwise no further acute osseous abnormalities.  Degenerative changes within the lower lumbar spine and sacroiliac joints.   Electronically Signed   By: Margaree Mackintosh M.D.   On: 04/26/2014 11:56   Dg Chest Port 1 View  04/26/2014   CLINICAL DATA:  Assess central line placement.  EXAM: PORTABLE CHEST - 1 VIEW  COMPARISON:  DG CHEST 1 VIEW dated 04/25/2014  FINDINGS: The patient has undergone placement of a left internal jugular venous catheter. The tip lies in the region of the distal SVC. There is no evidence of postprocedure pneumothorax or pneumothorax. The cardiopericardial silhouette is top-normal in size. The pulmonary vascularity is and not engorged. The lungs are adequately inflated and clear. There are degenerative changes of both shoulders.  IMPRESSION: There is no evidence of a postprocedure complication following  placement of the left internal jugular venous catheter.   Electronically Signed   By: David  Martinique   On: 04/26/2014 01:24     ASSESSMENT / PLAN:  PULMONARY A: No acute issues. P:   - Titrate O2 for sat of 88-92%.  CARDIOVASCULAR A:  Tachycardia related to SIRS/Sepsis P:  - IVF resuscitation. - Treat sepsis.  RENAL A:  No acute issues. P:   - BMP in AM. - Correct electrolytes as indicated.  GASTROINTESTINAL A:   NPO P:   - SUP not indicated. - Advance diet as tolerated.  HEMATOLOGIC A:   Anemia - likely blood loss s/p left hip hemiarthroplasty. P:  - PRBC's x 2 done. - SCD's for VTE proph. - CBC in AM.  INFECTIOUS A:   Sepsis - likely source is left hip infection. P:   - Continue abx, narrow as cultures result. - Tylenol /  Ibuprofen as ordered for fever. - Follow fever curve / WBC's.  ENDOCRINE A:   DM P:   - SSI.  NEUROLOGIC A:  Dementia Acute Encephalopathy. P:   - Monitor. - Pain control.  RHEUMATOLOGY A: Hx of rheumatoid arthritis >> on chronic prednisone as outpt. P: Might require stress steroids after surgery  Patient now full code, daughter per RN not sure if she really wants that, will need to be addressed and change back to DNR once family is available.  Transfer to SDU and back to [facility].  PCCM will sign off, please call back if needed.  Rush Farmer, M.D. [facility] Pulmonary/Critical Care Medicine. Pager: [phone]. After hours pager: [phone].

## 2014-04-27 NOTE — Progress Notes (Signed)
I participated in the care of this patient and agree with the above history, physical and evaluation. I performed a review of the history and a physical exam as detailed   Timothy Daniel Murphy MD  

## 2014-04-27 NOTE — Progress Notes (Signed)
Patient ID: Michelle Yoder, female   DOB: 01-21-1923, 80 y.o.   MRN: [id number]     Subjective:  Patient reports pain as mild to moderate.  Patient very confused and agitated.  Unaware of time and place  Objective:   VITALS:   Filed Vitals:   04/27/14 0500 04/27/14 0600 04/27/14 0700 04/27/14 0800  BP: 133/59 100/44 107/46 125/73  Pulse: 117 92 93 86  Temp: 99.7 F (37.6 C) 99.4 F (37.4 C) 99.1 F (37.3 C) 98.6 F (37 C)  TempSrc:      Resp: 14 12 13 12   Height:      Weight:      SpO2: 93% 91% 91% 93%    ABD soft Sensation intact distally Dorsiflexion/Plantar flexion intact Incision: dressing C/D/I and no drainage   Lab Results  Component Value Date   WBC 11.6* 04/27/2014   HGB 12.8 04/27/2014   HCT 37.6 04/27/2014   MCV 89.7 04/27/2014   PLT 228 04/27/2014     Assessment/Plan: 1 Day Post-Op   Active Problems:   Sepsis   Postoperative infection   Advance diet Continue plan per medicine NWB left lower ext Dry dressing PRN   Joya Gaskins 04/27/2014, 8:28 AM   Edmonia Lynch MD [phone]

## 2014-04-27 NOTE — Progress Notes (Signed)
Report called to 2C13.  Advised nurse that the suction to wound going to the cannister can remain to suction, per MD phone call this AM.  However, if there is copious drainage, the physician should be notified.  Also, ID rounded as the pt was being packed up to transfer and advised the pt should be placed on contact precautions until further notice.  The pt's wound culture has staph. aureus and antibiotics were adjusted per ID.  All VS were WNL upon transfer.  Pt was transferred on her bed.  There was not any significant amount of output to the hemovac or cannister suction prior to transfer.  Diet was advanced, as the pt ate well.

## 2014-04-27 NOTE — Evaluation (Signed)
Physical Therapy Evaluation Patient Details Name: Margret Moat MRN: [id number] DOB: 02/13/1923 Today's Date: 04/27/2014   History of Present Illness  80 yo female admitted from SN[facility] in [place] with Sepsis ( tachycardia, fever) and underwent 4/29 I& D with hardware removal to Lt HIp. Pt with x2 recent admissions. Pt was able to walk with cane and walker but sustained fall in [place] living at ALF. Pt underwent surg 3/15 for LT THA. Pt d/c from [facility] to [facility] SNF 3/18. Pt readmitted on 4/5 from [facility] s/p fall from w/c SIRS fever delirium. Pt with recent passing of her husband. PT d/c from [facility] to [facility] [place] SNF 4/10. hx of mild dementia, RA, HTN, DM Gout . Per progress notes: daughter notes visual changes since 3/15 surg  Clinical Impression  Pt admitted with above. Pt currently with functional limitations due to the deficits listed below (see PT Problem List).  Pt will benefit from skilled PT to increase their independence and safety with mobility to allow discharge to the venue listed below.     Follow Up Recommendations SNF;Supervision/Assistance - 24 hour    Equipment Recommendations  None recommended by PT    Recommendations for Other Services OT consult     Precautions / Restrictions Precautions Precautions: Fall;Posterior Hip Precaution Booklet Issued: Yes (comment) Precaution Comments: Drain and suction to wall as wound vac starting 4/29 and to sustain wound vac suction for 7-10 days per chart Required Braces or Orthoses: Knee Immobilizer - Left Knee Immobilizer - Left: On at all times Restrictions Weight Bearing Restrictions: Yes LLE Weight Bearing: Non weight bearing      Mobility  Bed Mobility Overal bed mobility: +2 for physical assistance;+ 2 for safety/equipment;Needs Assistance Bed Mobility: Sit to Supine;Supine to Sit     Supine to sit: Total assist;+2 for physical assistance;+2 for safety/equipment Sit to supine: Total assist;+2  for physical assistance;+2 for safety/equipment   General bed mobility comments: Pt requires total (A) for mobility with pad use. Pt reports my leg hurts with sitting position. Pt with poor sitting balance see below  Transfers                 General transfer comment: not assessed  Ambulation/Gait                Stairs            Wheelchair Mobility    Modified Rankin (Stroke Patients Only)       Balance Overall balance assessment: Needs assistance;History of Falls Sitting-balance support: Feet supported;Single extremity supported (Rt) Sitting balance-Leahy Scale: Zero Sitting balance - Comments: Pt required (A) due to posterior lean. pt reaching with RT UE to help with Hip flexion in sitting. pt with incr posterior lean as pain incr and verbal request to return supine.  Postural control: Posterior lean                                   Pertinent Vitals/Pain VSS, some left hip pain per pt but pt did not rate    Home Living Family/patient expects to be discharged to:: Skilled nursing facility                 Additional Comments: [facility] [place]. [facility] at [facility] and daughter does not like facility per chart    Prior Function Level of Independence: Needs assistance  Gait / Transfers Assistance Needed: no family present  ADL's / Homemaking Assistance Needed: total (A)  Comments: no family present; at this time assume total care; pt reports she was ambulating as tolerated.      Hand Dominance   Dominant Hand: Right    Extremity/Trunk Assessment   Upper Extremity Assessment: Defer to OT evaluation RUE Deficits / Details: RA hx, AROM ~80 degrees shoulder flexion, decr shoulder abduction ~50 degrees     LUE Deficits / Details: RA hx, AROM ~80 degrees shoulder flexion, decr shoulder abduction ~50 degrees   Lower Extremity Assessment: LLE deficits/detail;RLE deficits/detail RLE Deficits / Details:  grossly 3-/5 LLE Deficits / Details: unable to fully assess Lt LE secondary to pain and cognition   Cervical / Trunk Assessment: Kyphotic  Communication   Communication: HOH  Cognition Arousal/Alertness: Awake/alert Behavior During Therapy: Restless;Flat affect (easily irritable ) Overall Cognitive Status: History of cognitive impairments - at baseline       Memory: Decreased short-term memory;Decreased recall of precautions              General Comments General comments (skin integrity, edema, etc.): high risk for skin breakdown due to poor mobility and poor cognition    Exercises General Exercises - Lower Extremity Long Arc Quad: AAROM;Other reps (comment);Seated (Attempted and pt with trace assist x2)      Assessment/Plan    PT Assessment Patient needs continued PT services  PT Diagnosis Generalized weakness;Acute pain;Altered mental status   PT Problem List Decreased strength;Decreased activity tolerance;Decreased balance;Decreased mobility;Decreased coordination;Decreased cognition;Decreased knowledge of use of DME;Decreased safety awareness;Pain;Decreased range of motion;Decreased knowledge of precautions  PT Treatment Interventions DME instruction;Gait training;Functional mobility training;Therapeutic activities;Therapeutic exercise;Balance training;Patient/family education;Cognitive remediation;Neuromuscular re-education   PT Goals (Current goals can be found in the Care Plan section) Acute Rehab PT Goals Patient Stated Goal: None stated PT Goal Formulation: Patient unable to participate in goal setting Time For Goal Achievement: 05/11/14 Potential to Achieve Goals: Fair    Frequency Min 2X/week   Barriers to discharge Decreased caregiver support      Co-evaluation PT/OT/SLP Co-Evaluation/Treatment: Yes Reason for Co-Treatment: Complexity of the patient's impairments (multi-system involvement) PT goals addressed during session: Mobility/safety with  mobility OT goals addressed during session: ADL's and self-care       End of Session Equipment Utilized During Treatment: Left knee immobilizer;Oxygen (hand restraints) Activity Tolerance: Patient limited by lethargy;Patient limited by pain Patient left: in bed;with call bell/phone within reach;with bed alarm set;with restraints reapplied Nurse Communication: Mobility status;Need for lift equipment;Patient requests pain meds;Precautions;Weight bearing status         Time: 1035-1101 PT Time Calculation (min): 26 min   Charges:   PT Evaluation $Initial PT Evaluation Tier I: 1 Procedure PT Treatments $Therapeutic Activity: 8-22 mins   PT G Codes:          Lea Walbert Dorene Ar 26-May-2014, 1:14 PM [facility] Acute Rehabilitation [phone] (pager)

## 2014-04-27 NOTE — Progress Notes (Signed)
OT / PT NOTE  OT / PT to keep patient NWB LT HIP and with posterior hip precautions until otherwise clarified in orders.   Jeri Modena   OTR/L Pager: [phone] Office: [phone] .

## 2014-04-27 NOTE — Progress Notes (Signed)
INITIAL NUTRITION ASSESSMENT  DOCUMENTATION CODES Per approved criteria  -Not Applicable   INTERVENTION: Provide Glucerna Shakes BID RD to continue to monitor  NUTRITION DIAGNOSIS: Predicted suboptimal energy intake related to AMS and dementia as evidenced by pt refusing meals.   Goal: Pt to meet >/= 90% of their estimated nutrition needs   Monitor:  PO intake, weight trend, labs  Reason for Assessment: Malnutrition Screening Tool  80 y.o. female  Admitting Dx: sepsis  ASSESSMENT: 80 y.o. female recently admitted for L hip fx in March. who presents to the ED with fever and drainage from hip wound.  Work-up reveals sepsis.  Daughter reports persistent confusion and decreased appetite since hip fx. No family at bedside, pt currently unable to answer any of my questions. Has mittens on.  Ortho evaluated pt - dtr is agreeable to surgery for pt. Underwent HARDWARE REMOVAL, IRRIGATION AND DEBRIDEMENT HIP WITH ANTIBIOTIC BEAD INSERTION on 4/29.  Developed tachycardia and recurrent fever on 4/29.  Currently ordered for Heart Healthy diet. Has not touched lunch as of this time 1430.  Height: Ht Readings from Last 1 Encounters:  04/25/14 5' 4.17" (1.63 m)    Weight: Wt Readings from Last 1 Encounters:  04/26/14 138 lb 10.7 oz (62.9 kg)    Ideal Body Weight: 120 lbs  % Ideal Body Weight: 115%  Wt Readings from Last 10 Encounters:  04/26/14 138 lb 10.7 oz (62.9 kg)  04/26/14 138 lb 10.7 oz (62.9 kg)  04/07/14 130 lb 15.3 oz (59.4 kg)  03/08/14 133 lb 6.1 oz (60.5 kg)  03/08/14 133 lb 6.1 oz (60.5 kg)    Usual Body Weight: unknown  % Usual Body Weight: NA  BMI:  Body mass index is 23.67 kg/(m^2). WNL  Estimated Nutritional Needs: Kcal: 1600-1800 Protein: 75-90 grams Fluid: 1.6-1.8 L/day  Skin: left hip incision  Diet Order: Cardiac  EDUCATION NEEDS: -No education needs identified at this time   Intake/Output Summary (Last 24 hours) at 04/27/14  1348 Last data filed at 04/27/14 1015  Gross per 24 hour  Intake 4056.25 ml  Output    670 ml  Net 3386.25 ml    Last BM: 4/29  Labs:   Recent Labs Lab 04/26/14 0450 04/26/14 1620 04/27/14 0310  NA 142 143 142  K 3.6* 4.0 4.1  CL 111 113* 112  CO2 22 20 18*  BUN 23 23 21   CREATININE 0.81 0.73 0.74  CALCIUM 8.5 8.7 9.1  GLUCOSE 114* 86 65*    CBG (last 3)   Recent Labs  04/27/14 0442 04/27/14 0730 04/27/14 1316  GLUCAP 90 82 97    Scheduled Meds: . aspirin EC  325 mg Oral Q breakfast  .  ceFAZolin (ANCEF) IV  2 g Intravenous 3 times per day  . insulin aspart  0-9 Units Subcutaneous 6 times per day  . vancomycin  500 mg Intravenous Q12H    Continuous Infusions: . sodium chloride 125 mL/hr at 04/27/14 1015    Past Medical History  Diagnosis Date  . Arthritis   . Gout   . Osteoarthritis   . Diabetes mellitus without complication   . Hypertension   . Dementia   . Cancer   . Anemia   . GERD (gastroesophageal reflux disease)     Past Surgical History  Procedure Laterality Date  . Mastectomy    . Abdominal hysterectomy    . Hip arthroplasty Left 03/09/2014    Procedure: ARTHROPLASTY BIPOLAR HIP;  Surgeon: [name],  MD;  Location: [place];  Service: Orthopedics;  Laterality: Left;   Inda Coke MS, RD, LDN Inpatient Registered Dietitian Pager: [phone] After-hours pager: [phone]

## 2014-04-28 ENCOUNTER — Encounter (HOSPITAL_COMMUNITY): Payer: Self-pay | Admitting: Orthopedic Surgery

## 2014-04-28 DIAGNOSIS — A4901 Methicillin susceptible Staphylococcus aureus infection, unspecified site: Secondary | ICD-10-CM

## 2014-04-28 DIAGNOSIS — N182 Chronic kidney disease, stage 2 (mild): Secondary | ICD-10-CM | POA: Diagnosis present

## 2014-04-28 DIAGNOSIS — R7881 Bacteremia: Secondary | ICD-10-CM | POA: Diagnosis present

## 2014-04-28 DIAGNOSIS — Y849 Medical procedure, unspecified as the cause of abnormal reaction of the patient, or of later complication, without mention of misadventure at the time of the procedure: Secondary | ICD-10-CM

## 2014-04-28 DIAGNOSIS — R000 Tachycardia, unspecified: Secondary | ICD-10-CM | POA: Diagnosis present

## 2014-04-28 DIAGNOSIS — I517 Cardiomegaly: Secondary | ICD-10-CM

## 2014-04-28 LAB — TYPE AND SCREEN
ABO/RH(D): O POS
Antibody Screen: NEGATIVE
UNIT DIVISION: 0
UNIT DIVISION: 0
UNIT DIVISION: 0
Unit division: 0

## 2014-04-28 LAB — TISSUE CULTURE

## 2014-04-28 LAB — CBC
HEMATOCRIT: 37.6 % (ref 36.0–46.0)
Hemoglobin: 12.9 g/dL (ref 12.0–15.0)
MCH: 30.5 pg (ref 26.0–34.0)
MCHC: 34.3 g/dL (ref 30.0–36.0)
MCV: 88.9 fL (ref 78.0–100.0)
Platelets: 229 10*3/uL (ref 150–400)
RBC: 4.23 MIL/uL (ref 3.87–5.11)
RDW: 19 % — ABNORMAL HIGH (ref 11.5–15.5)
WBC: 10.8 10*3/uL — ABNORMAL HIGH (ref 4.0–10.5)

## 2014-04-28 LAB — CULTURE, BLOOD (ROUTINE X 2)

## 2014-04-28 LAB — GLUCOSE, CAPILLARY
GLUCOSE-CAPILLARY: 68 mg/dL — AB (ref 70–99)
GLUCOSE-CAPILLARY: 83 mg/dL (ref 70–99)
GLUCOSE-CAPILLARY: 105 mg/dL — AB (ref 70–99)
Glucose-Capillary: 116 mg/dL — ABNORMAL HIGH (ref 70–99)
Glucose-Capillary: 79 mg/dL (ref 70–99)
Glucose-Capillary: 81 mg/dL (ref 70–99)
Glucose-Capillary: 87 mg/dL (ref 70–99)

## 2014-04-28 LAB — COMPREHENSIVE METABOLIC PANEL
ALBUMIN: 1.6 g/dL — AB (ref 3.5–5.2)
ALT: 22 U/L (ref 0–35)
AST: 67 U/L — ABNORMAL HIGH (ref 0–37)
Alkaline Phosphatase: 314 U/L — ABNORMAL HIGH (ref 39–117)
BUN: 18 mg/dL (ref 6–23)
CALCIUM: 9.1 mg/dL (ref 8.4–10.5)
CO2: 19 mEq/L (ref 19–32)
CREATININE: 0.67 mg/dL (ref 0.50–1.10)
Chloride: 109 mEq/L (ref 96–112)
GFR calc Af Amer: 88 mL/min — ABNORMAL LOW (ref >90)
GFR calc non Af Amer: 76 mL/min — ABNORMAL LOW (ref >90)
GLUCOSE: 77 mg/dL (ref 70–99)
Potassium: 4.6 mEq/L (ref 3.7–5.3)
Sodium: 141 mEq/L (ref 137–147)
TOTAL PROTEIN: 5.2 g/dL — AB (ref 6.0–8.3)
Total Bilirubin: 0.6 mg/dL (ref 0.3–1.2)

## 2014-04-28 LAB — MAGNESIUM: Magnesium: 1.7 mg/dL (ref 1.5–2.5)

## 2014-04-28 LAB — HIV-1 RNA QUANT-NO REFLEX-BLD
HIV 1 RNA Quant: <20 copies/mL (ref <20)
HIV-1 RNA Quant, Log: <1.3 {Log} (ref <1.30)

## 2014-04-28 LAB — PHOSPHORUS: Phosphorus: 1.2 mg/dL — ABNORMAL LOW (ref 2.3–4.6)

## 2014-04-28 MED ORDER — METOPROLOL TARTRATE 1 MG/ML IV SOLN
5.0000 mg | Freq: Four times a day (QID) | INTRAVENOUS | Status: DC
  Administered 2014-04-28 – 2014-04-29 (×5): 5 mg via INTRAVENOUS
  Filled 2014-04-28 (×8): qty 5

## 2014-04-28 MED ORDER — PREDNISONE 2.5 MG PO TABS: 2.5000 mg | ORAL_TABLET | Freq: Two times a day (BID) | ORAL | Status: DC

## 2014-04-28 MED ORDER — PREDNISONE 2.5 MG PO TABS
2.5000 mg | ORAL_TABLET | Freq: Every day | ORAL | Status: DC
  Administered 2014-04-28 – 2014-05-01 (×4): 2.5 mg via ORAL
  Filled 2014-04-28 (×5): qty 1

## 2014-04-28 MED ORDER — METOPROLOL TARTRATE 1 MG/ML IV SOLN: 5.0000 mg | Freq: Three times a day (TID) | INTRAVENOUS | Status: DC

## 2014-04-28 MED ORDER — OXYCODONE HCL 5 MG PO TABS
5.0000 mg | ORAL_TABLET | ORAL | Status: DC | PRN
  Administered 2014-04-29 – 2014-05-01 (×4): 5 mg via ORAL
  Filled 2014-04-28 (×5): qty 1

## 2014-04-28 MED ORDER — ACETAMINOPHEN 325 MG PO TABS: 650.0000 mg | ORAL_TABLET | Freq: Four times a day (QID) | ORAL | Status: DC | PRN

## 2014-04-28 MED ORDER — PREDNISONE 5 MG PO TABS
5.0000 mg | ORAL_TABLET | Freq: Every day | ORAL | Status: DC
  Administered 2014-04-29 – 2014-05-02 (×4): 5 mg via ORAL
  Filled 2014-04-28 (×6): qty 1

## 2014-04-28 MED ORDER — QUETIAPINE 12.5 MG HALF TABLET
12.5000 mg | ORAL_TABLET | Freq: Every day | ORAL | Status: DC
  Administered 2014-04-28 – 2014-05-01 (×4): 12.5 mg via ORAL
  Filled 2014-04-28 (×5): qty 1

## 2014-04-28 MED ORDER — HYDRALAZINE HCL 20 MG/ML IJ SOLN
5.0000 mg | Freq: Four times a day (QID) | INTRAMUSCULAR | Status: DC | PRN
  Administered 2014-04-28 (×2): 5 mg via INTRAVENOUS
  Filled 2014-04-28 (×2): qty 1

## 2014-04-28 NOTE — Progress Notes (Signed)
Chaplain responded to consult in which pt's daughter wanted more information pertaining to code status.  Chaplain visited with pt but daughter not present.  Chaplain consulted with pt's doctor and he confirmed that pt suffers from dementia.  If pt's daughter desires a chaplain visit as a follow-up, it would be ideal to have nurse page chaplain on-call when daughter is present in order to assist in answering any questions.

## 2014-04-28 NOTE — Progress Notes (Signed)
I participated in the care of this patient and agree with the above history, physical and evaluation. I performed a review of the history and a physical exam as detailed   Timothy Daniel Murphy MD  

## 2014-04-28 NOTE — Progress Notes (Signed)
Patient ID: Nelle Sayed, female   DOB: 1923-02-20, 80 y.o.   MRN: [id number]     Subjective:  Patient reports pain as mild to moderate.  Patient unaware of time and place but is able to follow commands.  Denies any CP or SOB.  Objective:   VITALS:   Filed Vitals:   04/28/14 0300 04/28/14 0400 04/28/14 0641 04/28/14 0803  BP: 159/65  177/68   Pulse: 122  130   Temp:  98.5 F (36.9 C)  98.5 F (36.9 C)  TempSrc:  Oral  Oral  Resp: 19  18   Height:      Weight:  67 kg (147 lb 11.3 oz)    SpO2: 93%  94%     ABD soft Sensation intact distally Dorsiflexion/Plantar flexion intact Incision: dressing C/D/I and no drainage   Lab Results  Component Value Date   WBC 10.8* 04/28/2014   HGB 12.9 04/28/2014   HCT 37.6 04/28/2014   MCV 88.9 04/28/2014   PLT 229 04/28/2014     Assessment/Plan: 2 Days Post-Op   Active Problems:   Sepsis   Postoperative infection   Staphylococcus aureus bacteremia with sepsis   Infected prosthetic hip   Dementia with behavioral disturbance   Advance diet Up with therapy NWB left lower ext Continue plan per medicine    Joya Gaskins 04/28/2014, 10:18 AM   Edmonia Lynch MD [phone]

## 2014-04-28 NOTE — Evaluation (Signed)
Clinical/Bedside Swallow Evaluation Patient Details  Name: Michelle Yoder MRN: [id number] Date of Birth: Sep 18, 1923  Today's Date: 04/28/2014 Time: 1601-0932 SLP Time Calculation (min): 12 min  Past Medical History:  Past Medical History  Diagnosis Date  . Arthritis   . Gout   . Osteoarthritis   . Diabetes mellitus without complication   . Hypertension   . Dementia   . Cancer   . Anemia   . GERD (gastroesophageal reflux disease)    Past Surgical History:  Past Surgical History  Procedure Laterality Date  . Mastectomy    . Abdominal hysterectomy    . Hip arthroplasty Left 03/09/2014    Procedure: ARTHROPLASTY BIPOLAR HIP;  Surgeon: [name], MD;  Location: [place];  Service: Orthopedics;  Laterality: Left;  . Hardware removal Left 04/26/2014    Procedure: HARDWARE REMOVAL;  Surgeon: [name], MD;  Location: [place];  Service: Orthopedics;  Laterality: Left;  . Incision and drainage hip Left 04/26/2014    Procedure: IRRIGATION AND DEBRIDEMENT HIP WITH ANTIBIOTIC BEAD INSERTION;  Surgeon: [name], MD;  Location: [place];  Service: Orthopedics;  Laterality: Left;   HPI:  80 year old female patient with recent repair of left hip fracture March 2015. Pt admitted with evolving sepsis, underwent harware removal on 04/26/14 and debidement of the hip. Pt has had acute delirium in setting of known dementia. CXR has remained clear.    Assessment / Plan / Recommendation Clinical Impression  Pt presents with adequate airway protection with thin and puree, but she is reluctant to take PO. RN reports intake has been very poor. Pt without dentition, confused and with dementia, needs puree texture to facilitate intake. Will downgrade to dys 1 (puree)/thin, no further SLP interventions needed.     Aspiration Risk  Moderate    Diet Recommendation Dysphagia 1 (Puree);Thin liquid   Liquid Administration via: Cup;Straw Medication Administration: Crushed with puree Supervision:  Staff to assist with self feeding;Full supervision/cueing for compensatory strategies Compensations: Slow rate;Small sips/bites Postural Changes and/or Swallow Maneuvers: Seated upright 90 degrees    Other  Recommendations Oral Care Recommendations: Oral care BID   Follow Up Recommendations  Skilled Nursing facility    Frequency and Duration        Pertinent Vitals/Pain NA    SLP Swallow Goals     Swallow Study Prior Functional Status       General HPI: 80 year old female patient with recent repair of left hip fracture March 2015. Pt admitted with evolving sepsis, underwent harware removal on 04/26/14 and debidement of the hip. Pt has had acute delirium in setting of known dementia. CXR has remained clear.  Type of Study: Bedside swallow evaluation Previous Swallow Assessment: none in chart Diet Prior to this Study: Regular;Thin liquids Temperature Spikes Noted: No Respiratory Status: Nasal cannula History of Recent Intubation: No Behavior/Cognition: Alert;Uncooperative Oral Cavity - Dentition: Poor condition;Missing dentition;Dentures, not available Self-Feeding Abilities: Total assist Patient Positioning: Upright in bed Baseline Vocal Quality: Clear Volitional Cough: Cognitively unable to elicit    Oral/Motor/Sensory Function Overall Oral Motor/Sensory Function: Other (comment) (does not follow commands, no focal weakness)   Ice Chips     Thin Liquid Thin Liquid: Within functional limits Presentation: Straw    Nectar Thick Nectar Thick Liquid: Not tested   Honey Thick Honey Thick Liquid: Not tested   Puree Puree: Within functional limits   Solid   GO    Solid: Not tested      Herbie Baltimore, MA CCC-SLP  [place] 04/28/2014,4:04 PM

## 2014-04-28 NOTE — Progress Notes (Signed)
Echocardiogram 2D Echocardiogram has been performed.  [name] 04/28/2014, 8:41 AM

## 2014-04-28 NOTE — Progress Notes (Signed)
[facility] / ICU Progress Note  Michelle Yoder QBH:[id number] DOB: 1923/03/23 DOA: 04/25/2014 PCP: [name], MD  Time spent :  35 minutes  Brief narrative: 80 year old female patient with recent repair of left hip fracture March 2015. Patient also has rheumatoid arthritis on chronic steroids. Since the repair of the hip fracture the patient's daughter endorses the patient had been experiencing recurrent episodes of fever and confusion. The initial surgery was on March 12. She was most recently discharged on 04/06/2014 after an admission for systemic inflammatory response syndrome. CT of the abdomen and pelvis at that time was negative for source of infection although cultures from the left hip wound were positive for Pseudomonas and she was discharged on ciprofloxacin twice a day to continue for 10 more days. 24 hours prior to this presentation the daughter noted that the patient had fevers and was now noted to have frank discharge from the left hip wound site, The orthopedic group was contacted and recommended the patient presented to the emergency department for further evaluation. In addition to the above symptoms the daughter endorses the patient had been experiencing persistent confusion and anorexia since her hip fracture  In the emergency department the patient's white count was noted to be 15,500, her hemoglobin was low at 6.5. She had a MAXIMUM TEMPERATURE of 100.2. Her lactate was high normal at 1.9. Plain x-rays of the left hip and chest x-ray were normal. The emergency department physician assistant discussed the findings with Dr. Percell Miller the orthopedic surgeon and the initial plan was to hold antibiotics and plan for intraoperative incision and drainage of the left hip wound in the morning. Unfortunately the clinical picture seemed to be worsening and seemed to be consistent with an evolving sepsis so antibiotics consisting of vancomycin and cefepime were initiated  as a precaution.  After admission her fever worsened up to 102.2 and she developed tachycardia and concerns were that the patient was evolving into a septic shock-type pattern symmetrical care medicine was consulted.  She was taken to the operating room on 04/26/2014 for hardware removal, irrigation and debridement of the hip with antibiotic bead insertion. She was able to be extubated postoperatively. She had low urine output and appeared to be dehydrated so was given additional IV fluids. Blood cultures have been obtained in the emergency department and initially were positive for gram-positive cocci. Due to her presentation infectious disease service was consulted.  Postoperatively she developed acute delirium in the setting of known dementia and issues with sepsis but at the same time her hemodynamic status stabilized. She had persistent sinus tachycardia related to ongoing agitation in the setting of known sepsis. She was able to be transitioned out of the ICU. Subsequent blood cultures were positive for MSSA in 2 out of 2 bottles. Intraoperative tissue cultures grew out the same organism.  HPI/Subjective: Patient remains delirious and is therefore unable to provide a reliable history.  Assessment/Plan:  Infected prosthetic hip -post op care per Orthopedic service  Staphylococcus aureus bacteremia with sepsis -hemodynamically stable (excluding the persistent ST) -ID following -TTE pending- not optimal candidate for TEE -repeat blood cx' 4/29 negative -BP up so decrease IVFs to 75/hr- if can take POs well would further decrease rate  Rheumatoid arthritis/Chronic use of steroids -on low dose Prednisone at home BID -resume dose - if can't swallow give IV steroids daily  Dementia with behavioral disturbance -experiencing acute delirium -cont prn restrtaints -SLP eval -cont Glucerna  Sinus tachycardia -possibly  BB withdrawal - on Lopressor pre admit -start scheduled IV Lopressor  with hold parameters -certainly agitation and delirium contributing  Diabetes mellitus -controlled but watch for increased CBG once Prednisone resumed -on Januvia at home  Hypertension  -uncontrolled -resuming IV Lopressor and if can swallow convert to PO dose  Chronic kidney disease stage 2, GFR 60-89 ml/min  DVT prophylaxis: SCDs Code Status: Full-patient was DO NOT RESUSCITATE at time of admission but family opted to change CODE STATUS-likely this should be readdressed before the patient is discharged Family Communication: No family at bedside Disposition Plan/Expected LOS: Remain in step down  Consultants: Orthopedics Critical care medicine Infectious disease  Procedures: Hardware removal with irrigation and debridement of left hip and antibiotic bead insertion  04/26/2014  Antibiotics: Cefepime 4/26 >>> 4/30 Vancomycin 4/26 >>> 4/30 Gentamicin 4/29 x 1 dose Cefazolin 4/30 >>>  Objective: Blood pressure 177/68, pulse 130, temperature 98.1 F (36.7 C), temperature source Oral, resp. rate 18, height 5' 4.17" (1.63 m), weight 147 lb 11.3 oz (67 kg), SpO2 94.00%.  Intake/Output Summary (Last 24 hours) at 04/28/14 1256 Last data filed at 04/28/14 1200  Gross per 24 hour  Intake 3428.75 ml  Output    915 ml  Net 2513.75 ml   Exam: General: No acute respiratory distress but agitated and requiring meds in restraints Lungs: Clear to auscultation bilaterally without wheezes or crackles, RA Cardiovascular: Regular rate and rhythm without murmur gallop or rub normal S1 and S2, no peripheral edema or JVD Abdomen: Nontender, nondistended, soft, bowel sounds positive, no rebound, no ascites, no appreciable mass Musculoskeletal: No significant cyanosis, clubbing of bilateral lower extremities Neurological: Awake and oriented x name only, moves all extremities x 4 without focal neurological deficits-weaker in left leg due to complaints of pain and need for  immobilizer  Scheduled Meds:  Scheduled Meds: . aspirin EC  325 mg Oral Q breakfast  .  ceFAZolin (ANCEF) IV  2 g Intravenous 3 times per day  . feeding supplement (GLUCERNA SHAKE)  237 mL Oral BID BM  . insulin aspart  0-9 Units Subcutaneous 6 times per day   Data Reviewed: Basic Metabolic Panel:  Recent Labs Lab 04/25/14 1543 04/26/14 0450 04/26/14 1620 04/27/14 0310 04/28/14 0545  NA 140 142 143 142 141  K 4.6 3.6* 4.0 4.1 4.6  CL 105 111 113* 112 109  CO2 21 22 20  18* 19  GLUCOSE 163* 114* 86 65* 77  BUN 23 23 23 21 18   CREATININE 0.76 0.81 0.73 0.74 0.67  CALCIUM 9.4 8.5 8.7 9.1 9.1  MG  --   --   --   --  1.7  PHOS  --   --   --   --  1.2*   Liver Function Tests:  Recent Labs Lab 04/25/14 1543 04/26/14 0450 04/27/14 0310 04/28/14 0545  AST 43* 26 36 67*  ALT 28 21 25 22   ALKPHOS 250* 173* 211* 314*  BILITOT 0.7 0.6 0.7 0.6  PROT 5.7* 4.5* 5.2* 5.2*  ALBUMIN 1.7* 1.4* 1.4* 1.6*   CBC:  Recent Labs Lab 04/25/14 1543 04/26/14 0450 04/26/14 1004 04/27/14 0310 04/28/14 0545  WBC 15.5* 9.9  --  11.6* 10.8*  NEUTROABS 13.4*  --   --   --   --   HGB 6.5* 7.8* 15.0 12.8 12.9  HCT 20.1* 24.1* 44.2 37.6 37.6  MCV 93.9 93.8  --  89.7 88.9  PLT 402* 244  --  228 229   Cardiac Enzymes:  Recent Labs Lab 04/25/14 1543  TROPONINI <0.30   BNP (last 3 results)  Recent Labs  04/25/14 1543  PROBNP 4705.0*   CBG:  Recent Labs Lab 04/28/14 0028 04/28/14 0454 04/28/14 0539 04/28/14 0812 04/28/14 1233  GLUCAP 81 68* 83 79 87    Recent Results (from the past 240 hour(s))  CULTURE, BLOOD (ROUTINE X 2)     Status: None   Collection Time    04/25/14  6:50 PM      Result Value Ref Range Status   Specimen Description BLOOD HAND RIGHT   Final   Special Requests BOTTLES DRAWN AEROBIC AND ANAEROBIC 5CC   Final   Culture  Setup Time     Final   Value: 04/26/2014 00:54     Performed at [facility]   Culture     Final   Value: STAPHYLOCOCCUS  AUREUS     Note: RIFAMPIN AND GENTAMICIN SHOULD NOT BE USED AS SINGLE DRUGS FOR TREATMENT OF STAPH INFECTIONS. This organism DOES NOT demonstrate inducible Clindamycin resistance in vitro.     Note: Gram Stain Report Called to,Read Back By and Verified With: NICOLE Z BY INGRAM A 3PM 04/26/14     Performed at [facility]   Report Status 04/28/2014 FINAL   Final   Organism ID, Bacteria STAPHYLOCOCCUS AUREUS   Final  URINE CULTURE     Status: None   Collection Time    04/25/14  7:26 PM      Result Value Ref Range Status   Specimen Description URINE, CATHETERIZED   Final   Special Requests Normal   Final   Culture  Setup Time     Final   Value: 04/26/2014 01:02     Performed at [facility]     Final   Value: NO GROWTH     Performed at [facility]   Culture     Final   Value: NO GROWTH     Performed at [facility]   Report Status 04/26/2014 FINAL   Final  MRSA PCR SCREENING     Status: None   Collection Time    04/25/14 11:53 PM      Result Value Ref Range Status   MRSA by PCR NEGATIVE  NEGATIVE Final   Comment:            The GeneXpert MRSA Assay (FDA     approved for NASAL specimens     only), is one component of a     comprehensive MRSA colonization     surveillance program. It is not     intended to diagnose MRSA     infection nor to guide or     monitor treatment for     MRSA infections.  CULTURE, BLOOD (ROUTINE X 2)     Status: None   Collection Time    04/26/14  1:10 AM      Result Value Ref Range Status   Specimen Description BLOOD CENTRAL LINE   Final   Special Requests     Final   Value: BOTTLES DRAWN AEROBIC AND ANAEROBIC AER 10CC ANA 2CC   Culture  Setup Time     Final   Value: 04/26/2014 08:00     Performed at [facility]   Culture     Final   Value:        BLOOD CULTURE RECEIVED NO GROWTH TO DATE CULTURE WILL BE HELD FOR 5  DAYS BEFORE ISSUING A FINAL NEGATIVE REPORT     Performed at [facility]   Report Status PENDING   Incomplete  ANAEROBIC CULTURE     Status: None   Collection Time    04/26/14  8:58 AM      Result Value Ref Range Status   Specimen Description TISSUE HIP LEFT   Final   Special Requests LEFT DEEP TISSUE PT ON VANCO   Final   Gram Stain PENDING   Incomplete   Culture     Final   Value: NO ANAEROBES ISOLATED; CULTURE IN PROGRESS FOR 5 DAYS     Performed at [facility]   Report Status PENDING   Incomplete  TISSUE CULTURE     Status: None   Collection Time    04/26/14  8:58 AM      Result Value Ref Range Status   Specimen Description TISSUE HIP LEFT   Final   Special Requests LEFT DEEP TISSUE PT ON VANCO   Final   Gram Stain     Final   Value: ABUNDANT WBC PRESENT, PREDOMINANTLY PMN     NO SQUAMOUS EPITHELIAL CELLS SEEN     RARE GRAM POSITIVE COCCI     IN PAIRS     Performed at [facility]   Culture     Final   Value: FEW STAPHYLOCOCCUS AUREUS     Note: RIFAMPIN AND GENTAMICIN SHOULD NOT BE USED AS SINGLE DRUGS FOR TREATMENT OF STAPH INFECTIONS. This organism DOES NOT demonstrate inducible Clindamycin resistance in vitro.     Performed at [facility]   Report Status 04/28/2014 FINAL   Final   Organism ID, Bacteria STAPHYLOCOCCUS AUREUS   Final     Studies:  Recent x-ray studies have been reviewed in detail by the Attending Physician  Erin Hearing, ANP [facility] Office  [phone] Pager [phone]  **If unable to reach the above provider after paging please contact the [facility] @ [phone]  On-Call/Text Page:      [email or web address]      password [email or web address]  If 7PM-7AM, please contact night-coverage [email or web address] Password [email or web address] 04/28/2014, 12:56 PM   LOS: 3 days   I have personally examined this patient and reviewed the entire database. I have reviewed the above note, made any necessary editorial changes, and agree with its content.  Cherene Altes, MD [facility]

## 2014-04-28 NOTE — Progress Notes (Signed)
Physical Therapy Treatment Patient Details Name: Michelle Yoder MRN: [id number] DOB: 1923/08/16 Today's Date: 04/28/2014    History of Present Illness 80 yo female admitted from [facility] in [place] with Sepsis ( tachycardia, fever) and underwent 4/29 I& D with hardware removal to Lt HIp. Pt with x2 recent admissions. Pt was able to walk with cane and walker but sustained fall in [place] , [place] [facility] at [facility]F. Pt underwent surg 3/15 for LT THA. Pt d/c from [facility] to [facility] SN[facility] 3/18. Pt readmitted on 4/5 from [facility] s/p fall from w/c SIRS fever delirium. Pt with recent passing of her husband. PT d/c from [facility] to [facility] [place] [facility] 4/10. hx of mild dementia, RA, HTN, DM Gout . Per progress notes: daughter notes visual changes since 3/15 surg    PT Comments    Pt with very limited participation today. She responded appropriately/followed commands when spoken to loudly in her Lt ear (with hearing aid).   Follow Up Recommendations  SNF;Supervision/Assistance - 24 hour     Equipment Recommendations  None recommended by PT    Recommendations for Other Services       Precautions / Restrictions Precautions Precautions: Fall (no longer has hardware, no hip precautions ordered) Precaution Comments: Drain and suction to wall as wound vac starting 4/29 and to sustain wound vac suction for 7-10 days per chart Required Braces or Orthoses: Knee Immobilizer - Left Knee Immobilizer - Left: On at all times Restrictions LLE Weight Bearing: Non weight bearing    Mobility  Bed Mobility Overal bed mobility: Needs Assistance;+2 for physical assistance Bed Mobility: Rolling Rolling: Total assist;+2 for physical assistance         General bed mobility comments: Pt adamantly refusing to sit at EOB. Pt supine with HOB elevated and torso laterally flexed to Lt on arrival. Educated pt on hooking RUE with therapist and pulling herself to midline (pt with good effort and required 3 attempts and  mod assist to reach midline). Agreed to reposition on her Lt side and rolled to Lt with total assist (pt did turn her head to assist)  Transfers                    Ambulation/Gait                 Stairs            Wheelchair Mobility    Modified Rankin (Stroke Patients Only)       Balance                                    Cognition Arousal/Alertness: Lethargic Behavior During Therapy: Flat affect Overall Cognitive Status: History of cognitive impairments - at baseline                      Exercises General Exercises - Lower Extremity Ankle Circles/Pumps: AAROM;Both;5 reps;Supine (with heel cord stretch) Heel Slides: AAROM;Right;Supine    General Comments General comments (skin integrity, edema, etc.): very HOH, however did have hearing aid Lt ear and able to respond appropriately to questions/commands when speak loudly to her Lt ear      Pertinent Vitals/Pain HR 102-118 with bed level activities (likely due to pain?) Pt unable to rate pain, calls out with any movement of LLE; patient repositioned for comfort and calm at end of session    Home Living  Prior Function            PT Goals (current goals can now be found in the care plan section) Progress towards PT goals: Not progressing toward goals - comment (refusing further mobility due to fatigue)    Frequency  Min 2X/week    PT Plan Current plan remains appropriate    Co-evaluation             End of Session Equipment Utilized During Treatment: Left knee immobilizer;Oxygen Activity Tolerance: Patient limited by fatigue Patient left: in bed;with call bell/phone within reach;with restraints reapplied (bil mittens)     Time: 9449-6759 PT Time Calculation (min): 16 min  Charges:  $Therapeutic Activity: 8-22 mins                    G CodesJeanie Cooks Zamir Staples 23-May-2014, 2:39 PM Pager [phone]

## 2014-04-28 NOTE — Progress Notes (Addendum)
INFECTIOUS DISEASE PROGRESS NOTE  ID: Michelle Yoder is a 80 y.o. female with  Active Problems:   Sepsis   Postoperative infection   Staphylococcus aureus bacteremia with sepsis   Infected prosthetic hip   Dementia with behavioral disturbance  Subjective: confused  Abtx:  Anti-infectives   Start     Dose/Rate Route Frequency Ordered Stop   04/27/14 0930  ceFAZolin (ANCEF) IVPB 2 g/50 mL premix     2 g 100 mL/hr over 30 Minutes Intravenous 3 times per day 04/27/14 0920     04/26/14 1400  ceFEPIme (MAXIPIME) 1 g in dextrose 5 % 50 mL IVPB  Status:  Discontinued     1 g 100 mL/hr over 30 Minutes Intravenous Every 12 hours 04/26/14 1358 04/27/14 0920   04/26/14 1145  ceFAZolin (ANCEF) IVPB 2 g/50 mL premix  Status:  Discontinued     2 g 100 mL/hr over 30 Minutes Intravenous Every 6 hours 04/26/14 1142 04/26/14 1152   04/26/14 0916  gentamicin (GARAMYCIN) injection  Status:  Discontinued       As needed 04/26/14 0917 04/26/14 0947   04/26/14 0916  vancomycin (VANCOCIN) powder  Status:  Discontinued       As needed 04/26/14 0919 04/26/14 0947   04/25/14 2200  vancomycin (VANCOCIN) 500 mg in sodium chloride 0.9 % 100 mL IVPB     500 mg 100 mL/hr over 60 Minutes Intravenous Every 12 hours 04/25/14 2106     04/25/14 2200  ceFEPIme (MAXIPIME) 1 g in dextrose 5 % 50 mL IVPB  Status:  Discontinued     1 g 100 mL/hr over 30 Minutes Intravenous Every 24 hours 04/25/14 2106 04/26/14 1358   04/25/14 1630  vancomycin (VANCOCIN) IVPB 1000 mg/200 mL premix  Status:  Discontinued     1,000 mg 200 mL/hr over 60 Minutes Intravenous  Once 04/25/14 1626 04/25/14 1758   04/25/14 1630  ceFEPIme (MAXIPIME) 1 g in dextrose 5 % 50 mL IVPB  Status:  Discontinued     1 g 100 mL/hr over 30 Minutes Intravenous  Once 04/25/14 1626 04/25/14 1758      Medications:  Scheduled: . aspirin EC  325 mg Oral Q breakfast  .  ceFAZolin (ANCEF) IV  2 g Intravenous 3 times per day  . feeding supplement  (GLUCERNA SHAKE)  237 mL Oral BID BM  . insulin aspart  0-9 Units Subcutaneous 6 times per day  . vancomycin  500 mg Intravenous Q12H    Objective: Vital signs in last 24 hours: Temp:  [98.4 F (36.9 C)-98.8 F (37.1 C)] 98.5 F (36.9 C) (05/01 0803) Pulse Rate:  [102-130] 130 (05/01 0641) Resp:  [13-21] 18 (05/01 0641) BP: (143-177)/(59-116) 177/68 mmHg (05/01 0641) SpO2:  [93 %-96 %] 94 % (05/01 0641) Weight:  [67 kg (147 lb 11.3 oz)] 67 kg (147 lb 11.3 oz) (05/01 0400)   General appearance: she awakens easily, confused Resp: clear to auscultation bilaterally Cardio: regular rate and rhythm GI: normal findings: bowel sounds normal and soft, non-tender Extremities: L hip is dressed, drain in place  Lab Results  Recent Labs  04/27/14 0310 04/28/14 0545  WBC 11.6* 10.8*  HGB 12.8 12.9  HCT 37.6 37.6  NA 142 141  K 4.1 4.6  CL 112 109  CO2 18* 19  BUN 21 18  CREATININE 0.74 0.67   Liver Panel  Recent Labs  04/27/14 0310 04/28/14 0545  PROT 5.2* 5.2*  ALBUMIN 1.4* 1.6*  AST  36 67*  ALT 25 22  ALKPHOS 211* 314*  BILITOT 0.7 0.6   Sedimentation Rate  Recent Labs  04/27/14 0310  ESRSEDRATE 70*   C-Reactive Protein  Recent Labs  04/27/14 0310  CRP 34.7*    Microbiology: Recent Results (from the past 240 hour(s))  CULTURE, BLOOD (ROUTINE X 2)     Status: None   Collection Time    04/25/14  6:50 PM      Result Value Ref Range Status   Specimen Description BLOOD HAND RIGHT   Final   Special Requests BOTTLES DRAWN AEROBIC AND ANAEROBIC 5CC   Final   Culture  Setup Time     Final   Value: 04/26/2014 00:54     Performed at [facility]   Culture     Final   Value: STAPHYLOCOCCUS AUREUS     Note: RIFAMPIN AND GENTAMICIN SHOULD NOT BE USED AS SINGLE DRUGS FOR TREATMENT OF STAPH INFECTIONS. This organism DOES NOT demonstrate inducible Clindamycin resistance in vitro.     Note: Gram Stain Report Called to,Read Back By and Verified With: NICOLE  Z BY INGRAM A 3PM 04/26/14     Performed at [facility]   Report Status 04/28/2014 FINAL   Final   Organism ID, Bacteria STAPHYLOCOCCUS AUREUS   Final  URINE CULTURE     Status: None   Collection Time    04/25/14  7:26 PM      Result Value Ref Range Status   Specimen Description URINE, CATHETERIZED   Final   Special Requests Normal   Final   Culture  Setup Time     Final   Value: 04/26/2014 01:02     Performed at [facility]     Final   Value: NO GROWTH     Performed at [facility]   Culture     Final   Value: NO GROWTH     Performed at [facility]   Report Status 04/26/2014 FINAL   Final  MRSA PCR SCREENING     Status: None   Collection Time    04/25/14 11:53 PM      Result Value Ref Range Status   MRSA by PCR NEGATIVE  NEGATIVE Final   Comment:            The GeneXpert MRSA Assay (FDA     approved for NASAL specimens     only), is one component of a     comprehensive MRSA colonization     surveillance program. It is not     intended to diagnose MRSA     infection nor to guide or     monitor treatment for     MRSA infections.  CULTURE, BLOOD (ROUTINE X 2)     Status: None   Collection Time    04/26/14  1:10 AM      Result Value Ref Range Status   Specimen Description BLOOD CENTRAL LINE   Final   Special Requests     Final   Value: BOTTLES DRAWN AEROBIC AND ANAEROBIC AER 10CC ANA 2CC   Culture  Setup Time     Final   Value: 04/26/2014 08:00     Performed at [facility]   Culture     Final   Value:        BLOOD CULTURE RECEIVED NO GROWTH TO DATE CULTURE WILL BE HELD FOR 5 DAYS BEFORE ISSUING A FINAL NEGATIVE  REPORT     Performed at [facility]   Report Status PENDING   Incomplete  ANAEROBIC CULTURE     Status: None   Collection Time    04/26/14  8:58 AM      Result Value Ref Range Status   Specimen Description TISSUE HIP LEFT   Final   Special Requests LEFT DEEP TISSUE PT ON VANCO   Final   Gram  Stain PENDING   Incomplete   Culture     Final   Value: NO ANAEROBES ISOLATED; CULTURE IN PROGRESS FOR 5 DAYS     Performed at [facility]   Report Status PENDING   Incomplete  TISSUE CULTURE     Status: None   Collection Time    04/26/14  8:58 AM      Result Value Ref Range Status   Specimen Description TISSUE HIP LEFT   Final   Special Requests LEFT DEEP TISSUE PT ON VANCO   Final   Gram Stain     Final   Value: ABUNDANT WBC PRESENT, PREDOMINANTLY PMN     NO SQUAMOUS EPITHELIAL CELLS SEEN     RARE GRAM POSITIVE COCCI     IN PAIRS     Performed at [facility]   Culture     Final   Value: FEW STAPHYLOCOCCUS AUREUS     Note: RIFAMPIN AND GENTAMICIN SHOULD NOT BE USED AS SINGLE DRUGS FOR TREATMENT OF STAPH INFECTIONS. This organism DOES NOT demonstrate inducible Clindamycin resistance in vitro.     Performed at [facility]   Report Status 04/28/2014 FINAL   Final   Organism ID, Bacteria STAPHYLOCOCCUS AUREUS   Final    Studies/Results: Dg Pelvis Portable  04/26/2014   CLINICAL DATA:  postop  EXAM: PORTABLE PELVIS 1-2 VIEWS  COMPARISON:  CT ABD/PELVIS W CM dated 04/03/2014; CT HIP*L* W/O CM dated 03/08/2014  FINDINGS: The patient is status post left hip prostheses removal. Osteotomy changes along the base of the femoral neck. Multiple radiodense antibiotic beads are appreciated within the left femoral acetabular joint. Surgical drains are appreciated within the left hip. There is no evidence of fracture. The proximal femur is laterally displaced. Degenerative changes are appreciated within the lower lumbar spine and sacroiliac joints.  IMPRESSION: Postsurgical changes left hip. Otherwise no further acute osseous abnormalities.  Degenerative changes within the lower lumbar spine and sacroiliac joints.   Electronically Signed   By: Margaree Mackintosh M.D.   On: 04/26/2014 11:56     Assessment/Plan: THA infection Staph aureus bacteremia Dementia  Total days of  antibiotics: 6 (ancef/vanco) Would Stop vanco Repeat BCx 4-29 are ngtd Could check TTE but I doubt that she is candidate for CVTS Spoke with her RN, unclear why she is on isolation. Will d/c isolation.  I am not clear what her baseline mental status... Need to address nutrition.          [facility] Infectious Diseases (pager) [phone] [email or web address] 04/28/2014, 9:43 AM  LOS: 3 days   **Disclaimer: This note may have been dictated with voice recognition software. Similar sounding words can inadvertently be transcribed and this note may contain transcription errors which may not have been corrected upon publication of note.**

## 2014-04-29 LAB — CBC
HEMATOCRIT: 33.9 % — AB (ref 36.0–46.0)
Hemoglobin: 11.6 g/dL — ABNORMAL LOW (ref 12.0–15.0)
MCH: 30.4 pg (ref 26.0–34.0)
MCHC: 34.2 g/dL (ref 30.0–36.0)
MCV: 88.7 fL (ref 78.0–100.0)
PLATELETS: 231 10*3/uL (ref 150–400)
RBC: 3.82 MIL/uL — AB (ref 3.87–5.11)
RDW: 19.3 % — ABNORMAL HIGH (ref 11.5–15.5)
WBC: 10.2 10*3/uL (ref 4.0–10.5)

## 2014-04-29 LAB — COMPREHENSIVE METABOLIC PANEL
ALBUMIN: 1.3 g/dL — AB (ref 3.5–5.2)
ALT: 6 U/L (ref 0–35)
AST: 44 U/L — ABNORMAL HIGH (ref 0–37)
Alkaline Phosphatase: 466 U/L — ABNORMAL HIGH (ref 39–117)
BUN: 15 mg/dL (ref 6–23)
CO2: 20 mEq/L (ref 19–32)
CREATININE: 0.64 mg/dL (ref 0.50–1.10)
Calcium: 9.1 mg/dL (ref 8.4–10.5)
Chloride: 114 mEq/L — ABNORMAL HIGH (ref 96–112)
GFR calc Af Amer: 90 mL/min — ABNORMAL LOW (ref >90)
GFR calc non Af Amer: 77 mL/min — ABNORMAL LOW (ref >90)
Glucose, Bld: 96 mg/dL (ref 70–99)
Potassium: 3.7 mEq/L (ref 3.7–5.3)
Sodium: 145 mEq/L (ref 137–147)
TOTAL PROTEIN: 4.6 g/dL — AB (ref 6.0–8.3)
Total Bilirubin: 0.4 mg/dL (ref 0.3–1.2)

## 2014-04-29 LAB — GLUCOSE, CAPILLARY
GLUCOSE-CAPILLARY: 94 mg/dL (ref 70–99)
GLUCOSE-CAPILLARY: 108 mg/dL — AB (ref 70–99)
GLUCOSE-CAPILLARY: 124 mg/dL — AB (ref 70–99)

## 2014-04-29 MED ORDER — HYDRALAZINE HCL 10 MG PO TABS
10.0000 mg | ORAL_TABLET | Freq: Four times a day (QID) | ORAL | Status: DC
  Administered 2014-04-29 – 2014-05-02 (×11): 10 mg via ORAL
  Filled 2014-04-29 (×15): qty 1

## 2014-04-29 MED ORDER — MEMANTINE HCL ER 7 MG PO CP24
1.0000 | ORAL_CAPSULE | Freq: Every day | ORAL | Status: DC
  Administered 2014-04-30 – 2014-05-02 (×3): 7 mg via ORAL
  Filled 2014-04-29 (×6): qty 1

## 2014-04-29 MED ORDER — ASPIRIN 325 MG PO TABS
325.0000 mg | ORAL_TABLET | Freq: Every day | ORAL | Status: DC
  Administered 2014-04-29 – 2014-05-02 (×4): 325 mg via ORAL
  Filled 2014-04-29 (×5): qty 1

## 2014-04-29 MED ORDER — HYDRALAZINE HCL 20 MG/ML IJ SOLN
10.0000 mg | INTRAMUSCULAR | Status: DC | PRN
  Administered 2014-04-29 – 2014-05-02 (×4): 10 mg via INTRAVENOUS
  Filled 2014-04-29 (×5): qty 1

## 2014-04-29 MED ORDER — METOPROLOL TARTRATE 12.5 MG HALF TABLET
12.5000 mg | ORAL_TABLET | Freq: Two times a day (BID) | ORAL | Status: DC
  Administered 2014-04-29 – 2014-05-01 (×5): 12.5 mg via ORAL
  Filled 2014-04-29 (×8): qty 1

## 2014-04-29 MED ORDER — CITALOPRAM HYDROBROMIDE 10 MG PO TABS
10.0000 mg | ORAL_TABLET | Freq: Every day | ORAL | Status: DC
  Administered 2014-04-29 – 2014-05-02 (×4): 10 mg via ORAL
  Filled 2014-04-29 (×4): qty 1

## 2014-04-29 MED ORDER — SIMETHICONE 80 MG PO CHEW
80.0000 mg | CHEWABLE_TABLET | Freq: Two times a day (BID) | ORAL | Status: DC
  Administered 2014-04-29 – 2014-05-01 (×6): 80 mg via ORAL
  Filled 2014-04-29 (×8): qty 1

## 2014-04-29 NOTE — Progress Notes (Signed)
[place] / ICU Progress Note  Michelle Yoder [id number] DOB: 1923/12/08 DOA: 04/25/2014 PCP: [name], MD  Time spent :  35 minutes  Brief narrative: 80 year old female patient with recent repair of left hip fracture March 09, 2014. Patient also has rheumatoid arthritis on chronic steroids. Since the repair of the hip fracture the patient's daughter endorsed the patient had been experiencing recurrent episodes of fever and confusion. She was most recently discharged on 04/06/2014 after an admission for SIRS. CT of the abdomen and pelvis at that time was negative for source of infection although cultures from the left hip wound were positive for Pseudomonas and she was discharged on ciprofloxacin twice a day to continue for 10 more days. 24 hours prior to this presentation the daughter noted that the patient had fevers and was now noted to have frank discharge from the left hip wound site.  The orthopedic group was contacted and recommended the patient present to the emergency department for further evaluation.   In the emergency department the patient's white count was noted to be 15,500, her hemoglobin was low at 6.5. She had a temp of 100.2. Her lactate was high normal at 1.9. Plain x-rays of the left hip and chest x-ray were normal. The emergency department discussed the findings with Dr. Percell Miller the orthopedic surgeon and the initial plan was to plan for intraoperative incision and drainage of the left hip wound the following morning. After admission her fever climbed to 102.2 and she developed tachycardia and concerns were that the patient was evolving into a septic shock.  She was taken to the operating room on 04/26/2014 for hardware removal, irrigation and debridement of the hip with antibiotic bead insertion. She was able to be extubated postoperatively. She had low urine output and appeared to be dehydrated so was given additional IV fluids. Blood cultures  obtained in the emergency department were positive for gram-positive cocci. Infectious disease service was consulted.  Postoperatively she developed acute delirium in the setting of known dementia and issues with sepsis but at the same time her hemodynamic status stabilized. She had persistent sinus tachycardia related to ongoing agitation in the setting of known sepsis. She was able to be transitioned out of the ICU. Subsequent blood cultures were positive for MSSA in 2 out of 2 bottles. Intraoperative tissue cultures grew out the same organism.  HPI/Subjective: Patient remains delirious and is therefore unable to provide a reliable history.  She is however more alert today.  She does not appear to be in pain.    Assessment/Plan:  Infected prosthetic hip -post op care per Orthopedic service and ID   Staphylococcus aureus bacteremia with sepsis -hemodynamically stable -ID following -TTE w/ no evidence of vegetations - not optimal candidate for TEE  Rheumatoid arthritis / Chronic use of steroids -on low dose Prednisone at home BID -cont usual steroid dosing   Dementia with behavioral disturbance -experiencing acute delirium -cont prn restrtaints  Sinus tachycardia -likely BB withdrawal -improved w/ BB resumption   Diabetes mellitus -CBG reasonably controlled  Hypertension  -uncontrolled - adjust tx plan and follow   Chronic kidney disease stage 2, GFR 60-89 ml/min -crt stable   DVT prophylaxis: SCDs Code Status: FULL  Family Communication: spoke w/ daughter via phone at length  Disposition Plan/Expected LOS: SDU  Consultants: Orthopedics Infectious disease  Procedures: Hardware removal with irrigation and debridement of left hip and antibiotic bead insertion  04/26/2014  Antibiotics: Cefepime 4/28 >>> 4/30  Vancomycin 4/28 >>> 4/30 Gentamicin 4/29  Cefazolin 4/30 >>>  Objective: Blood pressure 177/75, pulse 94, temperature 98.5 F (36.9 C), temperature source  Oral, resp. rate 13, height 5' 4.17" (1.63 m), weight 67 kg (147 lb 11.3 oz), SpO2 96.00%.  Intake/Output Summary (Last 24 hours) at 04/29/14 1414 Last data filed at 04/29/14 1300  Gross per 24 hour  Intake   2035 ml  Output   1345 ml  Net    690 ml   Exam: General: No acute respiratory distress Lungs: very faint end exp wheeze - no focal crackles - good air movement th/o  Cardiovascular: Regular rate and rhythm without murmur gallop or rub Abdomen: Nontender, nondistended, soft, bowel sounds positive, no rebound, no ascites, no appreciable mass Musculoskeletal: No significant cyanosis, clubbing of bilateral lower extremities - trace B LE edema   Scheduled Meds:  Scheduled Meds: . aspirin  325 mg Oral Daily  .  ceFAZolin (ANCEF) IV  2 g Intravenous 3 times per day  . feeding supplement (GLUCERNA SHAKE)  237 mL Oral BID BM  . metoprolol  5 mg Intravenous 4 times per day  . predniSONE  2.5 mg Oral QHS  . predniSONE  5 mg Oral Q breakfast  . QUEtiapine  12.5 mg Oral QHS   Data Reviewed: Basic Metabolic Panel:  Recent Labs Lab 04/26/14 0450 04/26/14 1620 04/27/14 0310 04/28/14 0545 04/29/14 0600  NA 142 143 142 141 145  K 3.6* 4.0 4.1 4.6 3.7  CL 111 113* 112 109 114*  CO2 22 20 18* 19 20  GLUCOSE 114* 86 65* 77 96  BUN 23 23 21 18 15   CREATININE 0.81 0.73 0.74 0.67 0.64  CALCIUM 8.5 8.7 9.1 9.1 9.1  MG  --   --   --  1.7  --   PHOS  --   --   --  1.2*  --    Liver Function Tests:  Recent Labs Lab 04/25/14 1543 04/26/14 0450 04/27/14 0310 04/28/14 0545 04/29/14 0600  AST 43* 26 36 67* 44*  ALT 28 21 25 22 6   ALKPHOS 250* 173* 211* 314* 466*  BILITOT 0.7 0.6 0.7 0.6 0.4  PROT 5.7* 4.5* 5.2* 5.2* 4.6*  ALBUMIN 1.7* 1.4* 1.4* 1.6* 1.3*   CBC:  Recent Labs Lab 04/25/14 1543 04/26/14 0450 04/26/14 1004 04/27/14 0310 04/28/14 0545 04/29/14 0600  WBC 15.5* 9.9  --  11.6* 10.8* 10.2  NEUTROABS 13.4*  --   --   --   --   --   HGB 6.5* 7.8* 15.0 12.8 12.9  11.6*  HCT 20.1* 24.1* 44.2 37.6 37.6 33.9*  MCV 93.9 93.8  --  89.7 88.9 88.7  PLT 402* 244  --  228 229 231   Cardiac Enzymes:  Recent Labs Lab 04/25/14 1543  TROPONINI <0.30   BNP (last 3 results)  Recent Labs  04/25/14 1543  PROBNP 4705.0*   CBG:  Recent Labs Lab 04/28/14 1233 04/28/14 1630 04/28/14 2114 04/29/14 0850 04/29/14 1158  GLUCAP 87 105* 116* 94 108*    Recent Results (from the past 240 hour(s))  CULTURE, BLOOD (ROUTINE X 2)     Status: None   Collection Time    04/25/14  6:50 PM      Result Value Ref Range Status   Specimen Description BLOOD HAND RIGHT   Final   Special Requests BOTTLES DRAWN AEROBIC AND ANAEROBIC 5CC   Final   Culture  Setup Time  Final   Value: 04/26/2014 00:54     Performed at [facility]   Culture     Final   Value: STAPHYLOCOCCUS AUREUS     Note: RIFAMPIN AND GENTAMICIN SHOULD NOT BE USED AS SINGLE DRUGS FOR TREATMENT OF STAPH INFECTIONS. This organism DOES NOT demonstrate inducible Clindamycin resistance in vitro.     Note: Gram Stain Report Called to,Read Back By and Verified With: NICOLE Z BY INGRAM A 3PM 04/26/14     Performed at [facility]   Report Status 04/28/2014 FINAL   Final   Organism ID, Bacteria STAPHYLOCOCCUS AUREUS   Final  URINE CULTURE     Status: None   Collection Time    04/25/14  7:26 PM      Result Value Ref Range Status   Specimen Description URINE, CATHETERIZED   Final   Special Requests Normal   Final   Culture  Setup Time     Final   Value: 04/26/2014 01:02     Performed at [place]     Final   Value: NO GROWTH     Performed at [facility]   Culture     Final   Value: NO GROWTH     Performed at [facility]   Report Status 04/26/2014 FINAL   Final  MRSA PCR SCREENING     Status: None   Collection Time    04/25/14 11:53 PM      Result Value Ref Range Status   MRSA by PCR NEGATIVE  NEGATIVE Final   Comment:            The  GeneXpert MRSA Assay (FDA     approved for NASAL specimens     only), is one component of a     comprehensive MRSA colonization     surveillance program. It is not     intended to diagnose MRSA     infection nor to guide or     monitor treatment for     MRSA infections.  CULTURE, BLOOD (ROUTINE X 2)     Status: None   Collection Time    04/26/14  1:10 AM      Result Value Ref Range Status   Specimen Description BLOOD CENTRAL LINE   Final   Special Requests     Final   Value: BOTTLES DRAWN AEROBIC AND ANAEROBIC AER 10CC ANA 2CC   Culture  Setup Time     Final   Value: 04/26/2014 08:00     Performed at [facility]   Culture     Final   Value: GRAM POSITIVE COCCI IN CLUSTERS     Note: Gram Stain Report Called to,Read Back By and Verified With: Dorena Bodo 04/29/14 @ 11:46AM BY RUSCOE A.     Performed at [facility]   Report Status PENDING   Incomplete  ANAEROBIC CULTURE     Status: None   Collection Time    04/26/14  8:58 AM      Result Value Ref Range Status   Specimen Description TISSUE HIP LEFT   Final   Special Requests LEFT DEEP TISSUE PT ON VANCO   Final   Gram Stain PENDING   Incomplete   Culture     Final   Value: NO ANAEROBES ISOLATED; CULTURE IN PROGRESS FOR 5 DAYS     Performed at [facility]   Report Status PENDING   Incomplete  TISSUE CULTURE     Status: None   Collection Time    04/26/14  8:58 AM      Result Value Ref Range Status   Specimen Description TISSUE HIP LEFT   Final   Special Requests LEFT DEEP TISSUE PT ON VANCO   Final   Gram Stain     Final   Value: ABUNDANT WBC PRESENT, PREDOMINANTLY PMN     NO SQUAMOUS EPITHELIAL CELLS SEEN     RARE GRAM POSITIVE COCCI     IN PAIRS     Performed at [facility]   Culture     Final   Value: FEW STAPHYLOCOCCUS AUREUS     Note: RIFAMPIN AND GENTAMICIN SHOULD NOT BE USED AS SINGLE DRUGS FOR TREATMENT OF STAPH INFECTIONS. This organism DOES NOT demonstrate inducible  Clindamycin resistance in vitro.     Performed at [facility]   Report Status 04/28/2014 FINAL   Final   Organism ID, Bacteria STAPHYLOCOCCUS AUREUS   Final     Studies:  Recent x-ray studies have been reviewed in detail by the Attending Physician  Cherene Altes, MD [facility] For Consults/Admissions - Flow Manager - [phone] Office  [phone] Pager [phone]  On-Call/Text Page:      [email or web address]      password [facility]  04/29/2014, 2:14 PM   LOS: 4 days

## 2014-04-29 NOTE — Progress Notes (Signed)
CRITICAL VALUE ALERT  Critical value received:  Positive Blood Culture  Date of notification:  04/29/14  Time of notification:  3295  Critical value read back:yes  Nurse who received alert:  Dorena Bodo  MD notified (1st page):  Mc[place]ung  Time of first page:  1145  MD notified (2nd page):  Time of second page:  Responding MD:  Thereasa Solo  Time MD responded:

## 2014-04-29 NOTE — Progress Notes (Signed)
Upon shift assessment nurse noticed pt surgical applied dressing was saturated, with new drainage on bed pad.  Nurse contacted surgeon, surgeon informed nurse that as long as dressing was firm, dressing was acceptable. Nurse confirmed that dressing is firm.    MD will come to assess pt and dressing.  Nurse will continue to monitor

## 2014-04-29 NOTE — Progress Notes (Signed)
Subjective:  Patient is at baseline dementia  Objective:   VITALS:   Filed Vitals:   04/29/14 0400 04/29/14 0615 04/29/14 0800 04/29/14 1200  BP:  198/92 169/66 177/75  Pulse:  85 83 94  Temp: 98.6 F (37 C)  98.1 F (36.7 C) 98.5 F (36.9 C)  TempSrc: Oral  Oral Oral  Resp:  15 12 13   Height:      Weight:      SpO2:  94% 94% 96%    Physical Exam LLE:   Dressing: C/D/I, suction dressing firm.   Compartments soft  2+ pulses, wiggles toes  LABS  Results for orders placed during the hospital encounter of 04/25/14 (from the past 24 hour(s))  GLUCOSE, CAPILLARY     Status: Abnormal   Collection Time    04/28/14  4:30 PM      Result Value Ref Range   Glucose-Capillary 105 (*) 70 - 99 mg/dL  GLUCOSE, CAPILLARY     Status: Abnormal   Collection Time    04/28/14  9:14 PM      Result Value Ref Range   Glucose-Capillary 116 (*) 70 - 99 mg/dL   Comment 1 Documented in Chart     Comment 2 Notify RN    COMPREHENSIVE METABOLIC PANEL     Status: Abnormal   Collection Time    04/29/14  6:00 AM      Result Value Ref Range   Sodium 145  137 - 147 mEq/L   Potassium 3.7  3.7 - 5.3 mEq/L   Chloride 114 (*) 96 - 112 mEq/L   CO2 20  19 - 32 mEq/L   Glucose, Bld 96  70 - 99 mg/dL   BUN 15  6 - 23 mg/dL   Creatinine, Ser 0.64  0.50 - 1.10 mg/dL   Calcium 9.1  8.4 - 10.5 mg/dL   Total Protein 4.6 (*) 6.0 - 8.3 g/dL   Albumin 1.3 (*) 3.5 - 5.2 g/dL   AST 44 (*) 0 - 37 U/L   ALT 6  0 - 35 U/L   Alkaline Phosphatase 466 (*) 39 - 117 U/L   Total Bilirubin 0.4  0.3 - 1.2 mg/dL   GFR calc non Af Amer 77 (*) >90 mL/min   GFR calc Af Amer 90 (*) >90 mL/min  CBC     Status: Abnormal   Collection Time    04/29/14  6:00 AM      Result Value Ref Range   WBC 10.2  4.0 - 10.5 K/uL   RBC 3.82 (*) 3.87 - 5.11 MIL/uL   Hemoglobin 11.6 (*) 12.0 - 15.0 g/dL   HCT 33.9 (*) 36.0 - 46.0 %   MCV 88.7  78.0 - 100.0 fL   MCH 30.4  26.0 - 34.0 pg   MCHC 34.2  30.0 - 36.0 g/dL   RDW 19.3  (*) 11.5 - 15.5 %   Platelets 231  150 - 400 K/uL  GLUCOSE, CAPILLARY     Status: None   Collection Time    04/29/14  8:50 AM      Result Value Ref Range   Glucose-Capillary 94  70 - 99 mg/dL  GLUCOSE, CAPILLARY     Status: Abnormal   Collection Time    04/29/14 11:58 AM      Result Value Ref Range   Glucose-Capillary 108 (*) 70 - 99 mg/dL     Assessment/Plan: 3 Days Post-Op   Active Problems:   Diabetes  mellitus without complication   Hypertension   RA (rheumatoid arthritis)   Chronic use of steroids   Infected prosthetic hip   Dementia with behavioral disturbance   Staphylococcus aureus bacteremia   Sinus tachycardia   CKD (chronic kidney disease) stage 2, GFR 60-89 ml/min   PLAN: Weight Bearing: NWB LLE Dressings: Maintain suction dressing until Ortho team changes it, likely Monday Will d/c drain when decreased output.  VTE prophylaxis: ASA and SCD's PT/OT Dispo: pending   Michelle Yoder 04/29/2014, 1:19 PM   Edmonia Lynch, MD Cell [phone]

## 2014-04-30 LAB — CBC
HCT: 36.3 % (ref 36.0–46.0)
HEMOGLOBIN: 11.9 g/dL — AB (ref 12.0–15.0)
MCH: 29.6 pg (ref 26.0–34.0)
MCHC: 32.8 g/dL (ref 30.0–36.0)
MCV: 90.3 fL (ref 78.0–100.0)
Platelets: 210 10*3/uL (ref 150–400)
RBC: 4.02 MIL/uL (ref 3.87–5.11)
RDW: 19.1 % — ABNORMAL HIGH (ref 11.5–15.5)
WBC: 8.8 10*3/uL (ref 4.0–10.5)

## 2014-04-30 LAB — GLUCOSE, CAPILLARY
GLUCOSE-CAPILLARY: 117 mg/dL — AB (ref 70–99)
Glucose-Capillary: 155 mg/dL — ABNORMAL HIGH (ref 70–99)
Glucose-Capillary: 109 mg/dL — ABNORMAL HIGH (ref 70–99)
Glucose-Capillary: 159 mg/dL — ABNORMAL HIGH (ref 70–99)

## 2014-04-30 LAB — BASIC METABOLIC PANEL
BUN: 16 mg/dL (ref 6–23)
CO2: 21 mEq/L (ref 19–32)
Calcium: 9.2 mg/dL (ref 8.4–10.5)
Chloride: 111 mEq/L (ref 96–112)
Creatinine, Ser: 0.66 mg/dL (ref 0.50–1.10)
GFR calc Af Amer: 89 mL/min — ABNORMAL LOW (ref >90)
GFR, EST NON AFRICAN AMERICAN: 77 mL/min — AB (ref >90)
Glucose, Bld: 112 mg/dL — ABNORMAL HIGH (ref 70–99)
POTASSIUM: 3.8 meq/L (ref 3.7–5.3)
SODIUM: 143 meq/L (ref 137–147)

## 2014-04-30 MED ORDER — MORPHINE SULFATE 2 MG/ML IJ SOLN
1.0000 mg | INTRAMUSCULAR | Status: DC | PRN
Start: 2014-04-30 — End: 2014-05-02
  Administered 2014-05-01 – 2014-05-02 (×3): 1 mg via INTRAVENOUS
  Filled 2014-04-30 (×3): qty 1

## 2014-04-30 NOTE — Progress Notes (Signed)
[place] / ICU Progress Note  Michelle Yoder UUV:[id number] DOB: 02/26/1923 DOA: 04/25/2014 PCP: [name], MD  Time spent :  35 minutes  Brief narrative: 80 year old female patient with recent repair of left hip fracture March 09, 2014. Patient also has rheumatoid arthritis on chronic steroids. Since the repair of the hip fracture the patient's daughter endorsed the patient had been experiencing recurrent episodes of fever and confusion. She was most recently discharged on 04/06/2014 after an admission for SIRS. CT of the abdomen and pelvis at that time was negative for source of infection although cultures from the left hip wound were positive for Pseudomonas and she was discharged on ciprofloxacin twice a day to continue for 10 more days. 24 hours prior to this presentation the daughter noted that the patient had fevers and was now noted to have frank discharge from the left hip wound site.  The orthopedic group was contacted and recommended the patient present to the emergency department for further evaluation.   In the emergency department the patient's white count was noted to be 15,500, her hemoglobin was low at 6.5. She had a temp of 100.2. Her lactate was high normal at 1.9. Plain x-rays of the left hip and chest x-ray were normal. The emergency department discussed the findings with Dr. Percell Miller the orthopedic surgeon and the initial plan was to plan for intraoperative incision and drainage of the left hip wound the following morning. After admission her fever climbed to 102.2 and she developed tachycardia and concerns were that the patient was evolving into a septic shock.  She was taken to the operating room on 04/26/2014 for hardware removal, irrigation and debridement of the hip with antibiotic bead insertion. She was able to be extubated postoperatively. She had low urine output and appeared to be dehydrated so was given additional IV fluids. Blood cultures  obtained in the emergency department were positive for gram-positive cocci. Infectious disease service was consulted.  Postoperatively she developed acute delirium in the setting of known dementia and issues with sepsis but at the same time her hemodynamic status stabilized. She had persistent sinus tachycardia related to ongoing agitation in the setting of known sepsis. She was able to be transitioned out of the ICU. Subsequent blood cultures were positive for MSSA in 2 out of 2 bottles. Intraoperative tissue cultures grew out the same organism.  HPI/Subjective: The patient is screaming out loudly that she is in pain.  She is able to me she is in the hospital but cannot tell me which one or why.  She says she has bad pain in her back.  Assessment/Plan:  Infected prosthetic hip -post op care per Orthopedic service and ID   Staphylococcus aureus bacteremia with sepsis -hemodynamically stable -ID following -TTE w/ no evidence of vegetations - not optimal candidate for TEE - ?safe for PICC line now?  Rheumatoid arthritis / Chronic use of steroids -on low dose Prednisone at home BID -cont usual steroid dosing   Dementia with behavioral disturbance -experiencing acute delirium -cont prn restraints for pt safety/protection of equipement   Sinus tachycardia -likely BB withdrawal - resolved   Diabetes mellitus -CBG reasonably controlled  Hypertension  -uncontrolled - adjust tx plan and follow   Chronic kidney disease stage 2, GFR 60-89 ml/min -crt stable   DVT prophylaxis: SCDs Code Status: FULL  Family Communication: No family present at time of exam today Disposition Plan/Expected LOS: SDU - probable transfer to orthopedic floor 5/4  Consultants: Orthopedics Infectious disease  Procedures: Hardware removal with irrigation and debridement of left hip and antibiotic bead insertion  04/26/2014  Antibiotics: Cefepime 4/28 >>> 4/30 Vancomycin 4/28 >>> 4/30 Gentamicin 4/29    Cefazolin 4/30 >>>  Objective: Blood pressure 161/65, pulse 83, temperature 97.5 F (36.4 C), temperature source Axillary, resp. rate 12, height 5' 4.17" (1.63 m), weight 72.3 kg (159 lb 6.3 oz), SpO2 99.00%.  Intake/Output Summary (Last 24 hours) at 04/30/14 1506 Last data filed at 04/30/14 1200  Gross per 24 hour  Intake   1650 ml  Output   1190 ml  Net    460 ml   Exam: General: No acute respiratory distress Lungs: no focal crackles - good air movement th/o  Cardiovascular: Regular rate and rhythm without murmur gallop or rub Abdomen: Nontender, nondistended, soft, bowel sounds positive, no rebound, no ascites, no appreciable mass Musculoskeletal: No significant cyanosis, clubbing of bilateral lower extremities - trace B LE edema   Scheduled Meds:  Scheduled Meds: . aspirin  325 mg Oral Daily  .  ceFAZolin (ANCEF) IV  2 g Intravenous 3 times per day  . citalopram  10 mg Oral Daily  . feeding supplement (GLUCERNA SHAKE)  237 mL Oral BID BM  . hydrALAZINE  10 mg Oral 4 times per day  . Memantine HCl ER  1 capsule Oral Q breakfast  . metoprolol tartrate  12.5 mg Oral BID  . predniSONE  2.5 mg Oral QHS  . predniSONE  5 mg Oral Q breakfast  . QUEtiapine  12.5 mg Oral QHS  . simethicone  80 mg Oral BID   Data Reviewed: Basic Metabolic Panel:  Recent Labs Lab 04/26/14 1620 04/27/14 0310 04/28/14 0545 04/29/14 0600 04/30/14 0551  NA 143 142 141 145 143  K 4.0 4.1 4.6 3.7 3.8  CL 113* 112 109 114* 111  CO2 20 18* 19 20 21   GLUCOSE 86 65* 77 96 112*  BUN 23 21 18 15 16   CREATININE 0.73 0.74 0.67 0.64 0.66  CALCIUM 8.7 9.1 9.1 9.1 9.2  MG  --   --  1.7  --   --   PHOS  --   --  1.2*  --   --    Liver Function Tests:  Recent Labs Lab 04/25/14 1543 04/26/14 0450 04/27/14 0310 04/28/14 0545 04/29/14 0600  AST 43* 26 36 67* 44*  ALT 28 21 25 22 6   ALKPHOS 250* 173* 211* 314* 466*  BILITOT 0.7 0.6 0.7 0.6 0.4  PROT 5.7* 4.5* 5.2* 5.2* 4.6*  ALBUMIN 1.7* 1.4*  1.4* 1.6* 1.3*   CBC:  Recent Labs Lab 04/25/14 1543 04/26/14 0450 04/26/14 1004 04/27/14 0310 04/28/14 0545 04/29/14 0600 04/30/14 0551  WBC 15.5* 9.9  --  11.6* 10.8* 10.2 8.8  NEUTROABS 13.4*  --   --   --   --   --   --   HGB 6.5* 7.8* 15.0 12.8 12.9 11.6* 11.9*  HCT 20.1* 24.1* 44.2 37.6 37.6 33.9* 36.3  MCV 93.9 93.8  --  89.7 88.9 88.7 90.3  PLT 402* 244  --  228 229 231 210   Cardiac Enzymes:  Recent Labs Lab 04/25/14 1543  TROPONINI <0.30   BNP (last 3 results)  Recent Labs  04/25/14 1543  PROBNP 4705.0*   CBG:  Recent Labs Lab 04/29/14 1158 04/29/14 1709 04/29/14 2200 04/30/14 0819 04/30/14 1301  GLUCAP 108* 124* 155* 109* 117*    Recent Results (from the past  240 hour(s))  CULTURE, BLOOD (ROUTINE X 2)     Status: None   Collection Time    04/25/14  6:50 PM      Result Value Ref Range Status   Specimen Description BLOOD HAND RIGHT   Final   Special Requests BOTTLES DRAWN AEROBIC AND ANAEROBIC 5CC   Final   Culture  Setup Time     Final   Value: 04/26/2014 00:54     Performed at [facility]   Culture     Final   Value: STAPHYLOCOCCUS AUREUS     Note: RIFAMPIN AND GENTAMICIN SHOULD NOT BE USED AS SINGLE DRUGS FOR TREATMENT OF STAPH INFECTIONS. This organism DOES NOT demonstrate inducible Clindamycin resistance in vitro.     Note: Gram Stain Report Called to,Read Back By and Verified With: NICOLE Z BY INGRAM A 3PM 04/26/14     Performed at [facility]   Report Status 04/28/2014 FINAL   Final   Organism ID, Bacteria STAPHYLOCOCCUS AUREUS   Final  URINE CULTURE     Status: None   Collection Time    04/25/14  7:26 PM      Result Value Ref Range Status   Specimen Description URINE, CATHETERIZED   Final   Special Requests Normal   Final   Culture  Setup Time     Final   Value: 04/26/2014 01:02     Performed at [place]     Final   Value: NO GROWTH     Performed at [facility]   Culture      Final   Value: NO GROWTH     Performed at [facility]   Report Status 04/26/2014 FINAL   Final  MRSA PCR SCREENING     Status: None   Collection Time    04/25/14 11:53 PM      Result Value Ref Range Status   MRSA by PCR NEGATIVE  NEGATIVE Final   Comment:            The GeneXpert MRSA Assay (FDA     approved for NASAL specimens     only), is one component of a     comprehensive MRSA colonization     surveillance program. It is not     intended to diagnose MRSA     infection nor to guide or     monitor treatment for     MRSA infections.  CULTURE, BLOOD (ROUTINE X 2)     Status: None   Collection Time    04/26/14  1:10 AM      Result Value Ref Range Status   Specimen Description BLOOD CENTRAL LINE   Final   Special Requests     Final   Value: BOTTLES DRAWN AEROBIC AND ANAEROBIC AER 10CC ANA 2CC   Culture  Setup Time     Final   Value: 04/26/2014 08:00     Performed at [facility]   Culture     Final   Value: GRAM POSITIVE COCCI IN CLUSTERS     Note: Gram Stain Report Called to,Read Back By and Verified With: Dorena Bodo 04/29/14 @ 11:46AM BY RUSCOE A.     Performed at [facility]   Report Status PENDING   Incomplete  ANAEROBIC CULTURE     Status: None   Collection Time    04/26/14  8:58 AM      Result Value Ref Range Status  Specimen Description TISSUE HIP LEFT   Final   Special Requests LEFT DEEP TISSUE PT ON VANCO   Final   Gram Stain PENDING   Incomplete   Culture     Final   Value: NO ANAEROBES ISOLATED; CULTURE IN PROGRESS FOR 5 DAYS     Performed at [facility]   Report Status PENDING   Incomplete  TISSUE CULTURE     Status: None   Collection Time    04/26/14  8:58 AM      Result Value Ref Range Status   Specimen Description TISSUE HIP LEFT   Final   Special Requests LEFT DEEP TISSUE PT ON VANCO   Final   Gram Stain     Final   Value: ABUNDANT WBC PRESENT, PREDOMINANTLY PMN     NO SQUAMOUS EPITHELIAL CELLS SEEN     RARE  GRAM POSITIVE COCCI     IN PAIRS     Performed at [facility]   Culture     Final   Value: FEW STAPHYLOCOCCUS AUREUS     Note: RIFAMPIN AND GENTAMICIN SHOULD NOT BE USED AS SINGLE DRUGS FOR TREATMENT OF STAPH INFECTIONS. This organism DOES NOT demonstrate inducible Clindamycin resistance in vitro.     Performed at [facility]   Report Status 04/28/2014 FINAL   Final   Organism ID, Bacteria STAPHYLOCOCCUS AUREUS   Final     Studies:  Recent x-ray studies have been reviewed in detail by the Attending Physician  Cherene Altes, MD [facility] For Consults/Admissions - Flow Manager - [phone] Office  [phone] Pager [phone]  On-Call/Text Page:      [email or web address]      password [facility]  04/30/2014, 3:06 PM   LOS: 5 days

## 2014-04-30 NOTE — Progress Notes (Signed)
Subjective:  Patient is at baseline dementia  Objective:   VITALS:   Filed Vitals:   04/30/14 0408 04/30/14 0700 04/30/14 0707 04/30/14 0821  BP: 150/58 181/69 181/69 168/68  Pulse: 78 94  111  Temp: 98.1 F (36.7 C)   97.3 F (36.3 C)  TempSrc: Axillary   Axillary  Resp: 12 18  15   Height:      Weight: 72.3 kg (159 lb 6.3 oz)     SpO2: 97% 97%  97%    Physical Exam LLE:   Dressing: C/D/I, suction dressing firm.   Compartments soft  2+ pulses, wiggles toes  LABS  Results for orders placed during the hospital encounter of 04/25/14 (from the past 24 hour(s))  GLUCOSE, CAPILLARY     Status: Abnormal   Collection Time    04/29/14 11:58 AM      Result Value Ref Range   Glucose-Capillary 108 (*) 70 - 99 mg/dL  GLUCOSE, CAPILLARY     Status: Abnormal   Collection Time    04/29/14  5:09 PM      Result Value Ref Range   Glucose-Capillary 124 (*) 70 - 99 mg/dL  GLUCOSE, CAPILLARY     Status: Abnormal   Collection Time    04/29/14 10:00 PM      Result Value Ref Range   Glucose-Capillary 155 (*) 70 - 99 mg/dL  BASIC METABOLIC PANEL     Status: Abnormal   Collection Time    04/30/14  5:51 AM      Result Value Ref Range   Sodium 143  137 - 147 mEq/L   Potassium 3.8  3.7 - 5.3 mEq/L   Chloride 111  96 - 112 mEq/L   CO2 21  19 - 32 mEq/L   Glucose, Bld 112 (*) 70 - 99 mg/dL   BUN 16  6 - 23 mg/dL   Creatinine, Ser 0.66  0.50 - 1.10 mg/dL   Calcium 9.2  8.4 - 10.5 mg/dL   GFR calc non Af Amer 77 (*) >90 mL/min   GFR calc Af Amer 89 (*) >90 mL/min  CBC     Status: Abnormal   Collection Time    04/30/14  5:51 AM      Result Value Ref Range   WBC 8.8  4.0 - 10.5 K/uL   RBC 4.02  3.87 - 5.11 MIL/uL   Hemoglobin 11.9 (*) 12.0 - 15.0 g/dL   HCT 36.3  36.0 - 46.0 %   MCV 90.3  78.0 - 100.0 fL   MCH 29.6  26.0 - 34.0 pg   MCHC 32.8  30.0 - 36.0 g/dL   RDW 19.1 (*) 11.5 - 15.5 %   Platelets 210  150 - 400 K/uL  GLUCOSE, CAPILLARY     Status: Abnormal   Collection  Time    04/30/14  8:19 AM      Result Value Ref Range   Glucose-Capillary 109 (*) 70 - 99 mg/dL   Comment 1 Documented in Chart       Assessment/Plan: 4 Days Post-Op   Active Problems:   Diabetes mellitus without complication   Hypertension   RA (rheumatoid arthritis)   Chronic use of steroids   Infected prosthetic hip   Dementia with behavioral disturbance   Staphylococcus aureus bacteremia   Sinus tachycardia   CKD (chronic kidney disease) stage 2, GFR 60-89 ml/min   PLAN: Weight Bearing: NWB LLE Dressings: Maintain suction dressing until Ortho team changes it,  likely Monday  Hemovac drain out   VTE prophylaxis: ASA and SCD's PT/OT Dispo: pending   Michelle Yoder 04/30/2014, 9:09 AM   Edmonia Lynch, MD Cell [phone]

## 2014-05-01 LAB — CULTURE, BLOOD (ROUTINE X 2)

## 2014-05-01 LAB — ANAEROBIC CULTURE

## 2014-05-01 LAB — GLUCOSE, CAPILLARY
Glucose-Capillary: 104 mg/dL — ABNORMAL HIGH (ref 70–99)
Glucose-Capillary: 138 mg/dL — ABNORMAL HIGH (ref 70–99)
Glucose-Capillary: 169 mg/dL — ABNORMAL HIGH (ref 70–99)

## 2014-05-01 MED ORDER — SODIUM CHLORIDE 0.9 % IJ SOLN
10.0000 mL | INTRAMUSCULAR | Status: DC | PRN
Start: 2014-05-01 — End: 2014-05-02
  Administered 2014-05-02 (×3): 10 mL

## 2014-05-01 MED ORDER — SODIUM CHLORIDE 0.9 % IJ SOLN: 10.0000 mL | Freq: Two times a day (BID) | INTRAMUSCULAR | Status: DC

## 2014-05-01 NOTE — Progress Notes (Signed)
Upon entering room RN noted that pt's was wrapped in iv tubing which had loosened left IJ dressing. While performing dressing change, catheter was dislodged from site.  Pressure held, sutures removed, site cleansed and occlusive dressing applied.  Kathline Magic NP was paged to inform of above, awaiting return call.  Will obtain peripheral iv for access and continue to monitor.

## 2014-05-01 NOTE — Progress Notes (Signed)
Called report to [place]. RN will transfer pt per bed to [phone]

## 2014-05-01 NOTE — Progress Notes (Signed)
CSW provided handoff from covering [place] that pt is from [place] and will return. CSW following.   Ky Barban, MSW, [facility] Clinical Social Worker [phone]

## 2014-05-01 NOTE — Progress Notes (Signed)
[place] / ICU Progress Note  Michelle Yoder EGB:[id number] DOB: May 15, 1923 DOA: 04/25/2014 PCP: [name], MD  Time spent :  35 minutes  Brief narrative: 80 year old female patient with recent repair of left hip fracture March 09, 2014. Patient also has rheumatoid arthritis on chronic steroids. Since the repair of the hip fracture the patient's daughter endorsed the patient had been experiencing recurrent episodes of fever and confusion. She was most recently discharged on 04/06/2014 after an admission for SIRS. CT of the abdomen and pelvis at that time was negative for source of infection although cultures from the left hip wound were positive for Pseudomonas and she was discharged on ciprofloxacin twice a day to continue for 10 more days. 24 hours prior to this presentation the daughter noted that the patient had fevers and was now noted to have frank discharge from the left hip wound site.  The orthopedic group was contacted and recommended the patient present to the emergency department for further evaluation.   In the emergency department the patient's white count was noted to be 15,500, her hemoglobin was low at 6.5. She had a temp of 100.2. Her lactate was high normal at 1.9. Plain x-rays of the left hip and chest x-ray were normal. The emergency department discussed the findings with Dr. Percell Miller the orthopedic surgeon and the initial plan was to plan for intraoperative incision and drainage of the left hip wound the following morning. After admission her fever climbed to 102.2 and she developed tachycardia and concerns were that the patient was evolving into a septic shock.  She was taken to the operating room on 04/26/2014 for hardware removal, irrigation and debridement of the hip with antibiotic bead insertion. She was able to be extubated postoperatively. She had low urine output and appeared to be dehydrated so was given additional IV fluids. Blood cultures  obtained in the emergency department were positive for gram-positive cocci. Infectious disease service was consulted.  Postoperatively she developed acute delirium in the setting of known dementia and issues with sepsis but at the same time her hemodynamic status stabilized. She had persistent sinus tachycardia related to ongoing agitation in the setting of known sepsis. She was able to be transitioned out of the ICU. Subsequent blood cultures were positive for MSSA in 2 out of 2 bottles. Intraoperative tissue cultures grew out the same organism.  HPI/Subjective: The patient is sleeping soundly when I enter the room and easily awakens.  She is calm.  She has no new complaints today.  Assessment/Plan:  Infected prosthetic hip -post op care per Orthopedic service and ID - place PICC line today - once placed patient will be ready for skilled nursing facility placement  Staphylococcus aureus bacteremia with sepsis -hemodynamically stable -ID following -TTE w/ no evidence of vegetations - not optimal candidate for TEE -Repeat blood cultures drawn today -Now safe for PICC line placement -Plan for 6 weeks total of Ancef IV therapy (today is day 9)  Rheumatoid arthritis / Chronic use of steroids -on low dose Prednisone at home BID -cont usual steroid dosing   Dementia with behavioral disturbance -experiencing acute delirium, but seems to be improved today  -cont prn restraints for pt safety/protection of equipement   Sinus tachycardia -likely BB withdrawal - resolved   Diabetes mellitus -CBG reasonably controlled  Hypertension  -BP has improved w/ adjustments in tx plan yesterday   Chronic kidney disease stage 2, GFR 60-89 ml/min -crt stable   DVT prophylaxis:  SCDs Code Status: FULL  Family Communication: No family present at time of exam today Disposition Plan/Expected LOS: transfer to orthopedic floor 5/4 - d/c to SNF for rehab once PICC in place/arrangements complete    Consultants: Orthopedics Infectious disease  Procedures: Hardware removal with irrigation and debridement of left hip and antibiotic bead insertion  04/26/2014  Antibiotics: Cefepime 4/28 >>> 4/30 Vancomycin 4/28 >>> 4/30 Gentamicin 4/29  Cefazolin 4/30 >>> June 11 (planned 6 weeks)  Objective: Blood pressure 130/58, pulse 86, temperature 98.5 F (36.9 C), temperature source Oral, resp. rate 20, height 5' 4.17" (1.63 m), weight 72.3 kg (159 lb 6.3 oz), SpO2 94.00%.  Intake/Output Summary (Last 24 hours) at 05/01/14 1741 Last data filed at 05/01/14 1530  Gross per 24 hour  Intake   1540 ml  Output   1650 ml  Net   -110 ml   Exam: General: No acute respiratory distress Lungs: no focal crackles - good air movement th/o  Cardiovascular: Regular rate and rhythm without murmur gallop rub Abdomen: Nontender, nondistended, soft, bowel sounds positive, no rebound, no ascites, no appreciable mass Musculoskeletal: No significant cyanosis, clubbing of bilateral lower extremities - trace B LE edema   Scheduled Meds:  Scheduled Meds: . aspirin  325 mg Oral Daily  .  ceFAZolin (ANCEF) IV  2 g Intravenous 3 times per day  . citalopram  10 mg Oral Daily  . feeding supplement (GLUCERNA SHAKE)  237 mL Oral BID BM  . hydrALAZINE  10 mg Oral 4 times per day  . Memantine HCl ER  1 capsule Oral Q breakfast  . metoprolol tartrate  12.5 mg Oral BID  . predniSONE  2.5 mg Oral QHS  . predniSONE  5 mg Oral Q breakfast  . QUEtiapine  12.5 mg Oral QHS  . simethicone  80 mg Oral BID   Data Reviewed: Basic Metabolic Panel:  Recent Labs Lab 04/26/14 1620 04/27/14 0310 04/28/14 0545 04/29/14 0600 04/30/14 0551  NA 143 142 141 145 143  K 4.0 4.1 4.6 3.7 3.8  CL 113* 112 109 114* 111  CO2 20 18* 19 20 21   GLUCOSE 86 65* 77 96 112*  BUN 23 21 18 15 16   CREATININE 0.73 0.74 0.67 0.64 0.66  CALCIUM 8.7 9.1 9.1 9.1 9.2  MG  --   --  1.7  --   --   PHOS  --   --  1.2*  --   --    Liver  Function Tests:  Recent Labs Lab 04/25/14 1543 04/26/14 0450 04/27/14 0310 04/28/14 0545 04/29/14 0600  AST 43* 26 36 67* 44*  ALT 28 21 25 22 6   ALKPHOS [id number]* 173* 211* 314* 466*  BILITOT 0.7 0.6 0.7 0.6 0.4  PROT 5.7* 4.5* 5.2* 5.2* 4.6*  ALBUMIN 1.7* 1.4* 1.4* 1.6* 1.3*   CBC:  Recent Labs Lab 04/25/14 1543 04/26/14 0450 04/26/14 1004 04/27/14 0310 04/28/14 0545 04/29/14 0600 04/30/14 0551  WBC 15.5* 9.9  --  11.6* 10.8* 10.2 8.8  NEUTROABS 13.4*  --   --   --   --   --   --   HGB 6.5* 7.8* 15.0 12.8 12.9 11.6* 11.9*  HCT 20.1* 24.1* 44.2 37.6 37.6 33.9* 36.3  MCV 93.9 93.8  --  89.7 88.9 88.7 90.3  PLT 402* 244  --  228 229 231 210   Cardiac Enzymes:  Recent Labs Lab 04/25/14 1543  TROPONINI <0.30   BNP (last 3 results)  Recent Labs  04/25/14 1543  PROBNP 4705.0*   CBG:  Recent Labs Lab 04/30/14 0819 04/30/14 1301 04/30/14 1650 05/01/14 0746 05/01/14 1125  GLUCAP 109* 117* 159* 104* 138*    Recent Results (from the past 240 hour(s))  CULTURE, BLOOD (ROUTINE X 2)     Status: None   Collection Time    04/25/14  6:50 PM      Result Value Ref Range Status   Specimen Description BLOOD HAND RIGHT   Final   Special Requests BOTTLES DRAWN AEROBIC AND ANAEROBIC 5CC   Final   Culture  Setup Time     Final   Value: 04/26/2014 00:54     Performed at [facility]   Culture     Final   Value: STAPHYLOCOCCUS AUREUS     Note: RIFAMPIN AND GENTAMICIN SHOULD NOT BE USED AS SINGLE DRUGS FOR TREATMENT OF STAPH INFECTIONS. This organism DOES NOT demonstrate inducible Clindamycin resistance in vitro.     Note: Gram Stain Report Called to,Read Back By and Verified With: NICOLE Z BY INGRAM A 3PM 04/26/14     Performed at [facility]   Report Status 04/28/2014 FINAL   Final   Organism ID, Bacteria STAPHYLOCOCCUS AUREUS   Final  URINE CULTURE     Status: None   Collection Time    04/25/14  7:26 PM      Result Value Ref Range Status   Specimen  Description URINE, CATHETERIZED   Final   Special Requests Normal   Final   Culture  Setup Time     Final   Value: 04/26/2014 01:02     Performed at [facility]     Final   Value: NO GROWTH     Performed at [facility]   Culture     Final   Value: NO GROWTH     Performed at [facility]   Report Status 04/26/2014 FINAL   Final  MRSA PCR SCREENING     Status: None   Collection Time    04/25/14 11:53 PM      Result Value Ref Range Status   MRSA by PCR NEGATIVE  NEGATIVE Final   Comment:            The GeneXpert MRSA Assay (FDA     approved for NASAL specimens     only), is one component of a     comprehensive MRSA colonization     surveillance program. It is not     intended to diagnose MRSA     infection nor to guide or     monitor treatment for     MRSA infections.  CULTURE, BLOOD (ROUTINE X 2)     Status: None   Collection Time    04/26/14  1:10 AM      Result Value Ref Range Status   Specimen Description BLOOD CENTRAL LINE   Final   Special Requests     Final   Value: BOTTLES DRAWN AEROBIC AND ANAEROBIC AER 10CC ANA 2CC   Culture  Setup Time     Final   Value: 04/26/2014 08:00     Performed at [facility]   Culture     Final   Value: STAPHYLOCOCCUS AUREUS     Note: RIFAMPIN AND GENTAMICIN SHOULD NOT BE USED AS SINGLE DRUGS FOR TREATMENT OF STAPH INFECTIONS. This organism DOES NOT demonstrate inducible Clindamycin resistance in vitro.     Note: Gram  Stain Report Called to,Read Back By and Verified With: Dorena Bodo 04/29/14 @ 11:46AM BY RUSCOE A.     Performed at [facility]   Report Status 05/01/2014 FINAL   Final   Organism ID, Bacteria STAPHYLOCOCCUS AUREUS   Final  ANAEROBIC CULTURE     Status: None   Collection Time    04/26/14  8:58 AM      Result Value Ref Range Status   Specimen Description TISSUE HIP LEFT   Final   Special Requests LEFT DEEP TISSUE PT ON VANCO   Final   Gram Stain     Final    Value: ABUNDANT WBC PRESENT, PREDOMINANTLY PMN     NO SQUAMOUS EPITHELIAL CELLS SEEN     RARE GRAM POSITIVE COCCI     IN PAIRS     Performed at [facility]   Culture     Final   Value: NO ANAEROBES ISOLATED     Performed at [facility]   Report Status 05/01/2014 FINAL   Final  TISSUE CULTURE     Status: None   Collection Time    04/26/14  8:58 AM      Result Value Ref Range Status   Specimen Description TISSUE HIP LEFT   Final   Special Requests LEFT DEEP TISSUE PT ON VANCO   Final   Gram Stain     Final   Value: ABUNDANT WBC PRESENT, PREDOMINANTLY PMN     NO SQUAMOUS EPITHELIAL CELLS SEEN     RARE GRAM POSITIVE COCCI     IN PAIRS     Performed at [facility]   Culture     Final   Value: FEW STAPHYLOCOCCUS AUREUS     Note: RIFAMPIN AND GENTAMICIN SHOULD NOT BE USED AS SINGLE DRUGS FOR TREATMENT OF STAPH INFECTIONS. This organism DOES NOT demonstrate inducible Clindamycin resistance in vitro.     Performed at [facility]   Report Status 04/28/2014 FINAL   Final   Organism ID, Bacteria STAPHYLOCOCCUS AUREUS   Final     Studies:  Recent x-ray studies have been reviewed in detail by the Attending Physician  Cherene Altes, MD [facility] For Consults/Admissions - Flow Manager - [phone] Office  [phone] Pager [phone]  On-Call/Text Page:      [email or web address]      password [facility]  05/01/2014, 5:41 PM   LOS: 6 days

## 2014-05-01 NOTE — Progress Notes (Signed)
INFECTIOUS DISEASE PROGRESS NOTE  ID: Michelle Yoder is a 80 y.o. female with  Active Problems:   Diabetes mellitus without complication   Hypertension   RA (rheumatoid arthritis)   Chronic use of steroids   Infected prosthetic hip   Dementia with behavioral disturbance   Staphylococcus aureus bacteremia   Sinus tachycardia   CKD (chronic kidney disease) stage 2, GFR 60-89 ml/min  Subjective: without complaints (except that I am leaving the room)  Abtx:  Anti-infectives   Start     Dose/Rate Route Frequency Ordered Stop   04/27/14 0930  ceFAZolin (ANCEF) IVPB 2 g/50 mL premix     2 g 100 mL/hr over 30 Minutes Intravenous 3 times per day 04/27/14 0920     04/26/14 1400  ceFEPIme (MAXIPIME) 1 g in dextrose 5 % 50 mL IVPB  Status:  Discontinued     1 g 100 mL/hr over 30 Minutes Intravenous Every 12 hours 04/26/14 1358 04/27/14 0920   04/26/14 1145  ceFAZolin (ANCEF) IVPB 2 g/50 mL premix  Status:  Discontinued     2 g 100 mL/hr over 30 Minutes Intravenous Every 6 hours 04/26/14 1142 04/26/14 1152   04/26/14 0916  gentamicin (GARAMYCIN) injection  Status:  Discontinued       As needed 04/26/14 0917 04/26/14 0947   04/26/14 0916  vancomycin (VANCOCIN) powder  Status:  Discontinued       As needed 04/26/14 0919 04/26/14 0947   04/25/14 2200  vancomycin (VANCOCIN) 500 mg in sodium chloride 0.9 % 100 mL IVPB  Status:  Discontinued     500 mg 100 mL/hr over 60 Minutes Intravenous Every 12 hours 04/25/14 2106 04/28/14 0955   04/25/14 2200  ceFEPIme (MAXIPIME) 1 g in dextrose 5 % 50 mL IVPB  Status:  Discontinued     1 g 100 mL/hr over 30 Minutes Intravenous Every 24 hours 04/25/14 2106 04/26/14 1358   04/25/14 1630  vancomycin (VANCOCIN) IVPB 1000 mg/200 mL premix  Status:  Discontinued     1,000 mg 200 mL/hr over 60 Minutes Intravenous  Once 04/25/14 1626 04/25/14 1758   04/25/14 1630  ceFEPIme (MAXIPIME) 1 g in dextrose 5 % 50 mL IVPB  Status:  Discontinued     1 g 100 mL/hr  over 30 Minutes Intravenous  Once 04/25/14 1626 04/25/14 1758      Medications:  Scheduled: . aspirin  325 mg Oral Daily  .  ceFAZolin (ANCEF) IV  2 g Intravenous 3 times per day  . citalopram  10 mg Oral Daily  . feeding supplement (GLUCERNA SHAKE)  237 mL Oral BID BM  . hydrALAZINE  10 mg Oral 4 times per day  . Memantine HCl ER  1 capsule Oral Q breakfast  . metoprolol tartrate  12.5 mg Oral BID  . predniSONE  2.5 mg Oral QHS  . predniSONE  5 mg Oral Q breakfast  . QUEtiapine  12.5 mg Oral QHS  . simethicone  80 mg Oral BID    Objective: Vital signs in last 24 hours: Temp:  [97.2 F (36.2 C)-98.1 F (36.7 C)] 98.1 F (36.7 C) (05/04 1122) Pulse Rate:  [73-110] 84 (05/04 1122) Resp:  [11-19] 19 (05/04 1122) BP: (133-183)/(58-98) 133/62 mmHg (05/04 1122) SpO2:  [92 %-100 %] 93 % (05/04 1122)   General appearance: alert, cooperative and no distress Resp: clear to auscultation bilaterally Cardio: regular rate and rhythm GI: normal findings: bowel sounds normal and soft, non-tender Extremities: drain in place  on L hip.   Lab Results  Recent Labs  04/29/14 0600 04/30/14 0551  WBC 10.2 8.8  HGB 11.6* 11.9*  HCT 33.9* 36.3  NA 145 143  K 3.7 3.8  CL 114* 111  CO2 20 21  BUN 15 16  CREATININE 0.64 0.66   Liver Panel  Recent Labs  04/29/14 0600  PROT 4.6*  ALBUMIN 1.3*  AST 44*  ALT 6  ALKPHOS 466*  BILITOT 0.4   Sedimentation Rate No results found for this basename: ESRSEDRATE,  in the last 72 hours C-Reactive Protein No results found for this basename: CRP,  in the last 72 hours  Microbiology: Recent Results (from the past 240 hour(s))  CULTURE, BLOOD (ROUTINE X 2)     Status: None   Collection Time    04/25/14  6:50 PM      Result Value Ref Range Status   Specimen Description BLOOD HAND RIGHT   Final   Special Requests BOTTLES DRAWN AEROBIC AND ANAEROBIC 5CC   Final   Culture  Setup Time     Final   Value: 04/26/2014 00:54     Performed at  [facility]   Culture     Final   Value: STAPHYLOCOCCUS AUREUS     Note: RIFAMPIN AND GENTAMICIN SHOULD NOT BE USED AS SINGLE DRUGS FOR TREATMENT OF STAPH INFECTIONS. This organism DOES NOT demonstrate inducible Clindamycin resistance in vitro.     Note: Gram Stain Report Called to,Read Back By and Verified With: NICOLE Z BY INGRAM A 3PM 04/26/14     Performed at [facility]   Report Status 04/28/2014 FINAL   Final   Organism ID, Bacteria STAPHYLOCOCCUS AUREUS   Final  URINE CULTURE     Status: None   Collection Time    04/25/14  7:26 PM      Result Value Ref Range Status   Specimen Description URINE, CATHETERIZED   Final   Special Requests Normal   Final   Culture  Setup Time     Final   Value: 04/26/2014 01:02     Performed at [place]     Final   Value: NO GROWTH     Performed at [facility]   Culture     Final   Value: NO GROWTH     Performed at [facility]   Report Status 04/26/2014 FINAL   Final  MRSA PCR SCREENING     Status: None   Collection Time    04/25/14 11:53 PM      Result Value Ref Range Status   MRSA by PCR NEGATIVE  NEGATIVE Final   Comment:            The GeneXpert MRSA Assay (FDA     approved for NASAL specimens     only), is one component of a     comprehensive MRSA colonization     surveillance program. It is not     intended to diagnose MRSA     infection nor to guide or     monitor treatment for     MRSA infections.  CULTURE, BLOOD (ROUTINE X 2)     Status: None   Collection Time    04/26/14  1:10 AM      Result Value Ref Range Status   Specimen Description BLOOD CENTRAL LINE   Final   Special Requests     Final   Value: BOTTLES DRAWN  AEROBIC AND ANAEROBIC AER 10CC ANA 2CC   Culture  Setup Time     Final   Value: 04/26/2014 08:00     Performed at [facility]   Culture     Final   Value: STAPHYLOCOCCUS AUREUS     Note: RIFAMPIN AND GENTAMICIN SHOULD NOT BE USED AS SINGLE  DRUGS FOR TREATMENT OF STAPH INFECTIONS. This organism DOES NOT demonstrate inducible Clindamycin resistance in vitro.     Note: Gram Stain Report Called to,Read Back By and Verified With: Dorena Bodo 04/29/14 @ 11:46AM BY RUSCOE A.     Performed at [facility]   Report Status 05/01/2014 FINAL   Final   Organism ID, Bacteria STAPHYLOCOCCUS AUREUS   Final  ANAEROBIC CULTURE     Status: None   Collection Time    04/26/14  8:58 AM      Result Value Ref Range Status   Specimen Description TISSUE HIP LEFT   Final   Special Requests LEFT DEEP TISSUE PT ON VANCO   Final   Gram Stain PENDING   Incomplete   Culture     Final   Value: NO ANAEROBES ISOLATED; CULTURE IN PROGRESS FOR 5 DAYS     Performed at [facility]   Report Status PENDING   Incomplete  TISSUE CULTURE     Status: None   Collection Time    04/26/14  8:58 AM      Result Value Ref Range Status   Specimen Description TISSUE HIP LEFT   Final   Special Requests LEFT DEEP TISSUE PT ON VANCO   Final   Gram Stain     Final   Value: ABUNDANT WBC PRESENT, PREDOMINANTLY PMN     NO SQUAMOUS EPITHELIAL CELLS SEEN     RARE GRAM POSITIVE COCCI     IN PAIRS     Performed at [facility]   Culture     Final   Value: FEW STAPHYLOCOCCUS AUREUS     Note: RIFAMPIN AND GENTAMICIN SHOULD NOT BE USED AS SINGLE DRUGS FOR TREATMENT OF STAPH INFECTIONS. This organism DOES NOT demonstrate inducible Clindamycin resistance in vitro.     Performed at [facility]   Report Status 04/28/2014 FINAL   Final   Organism ID, Bacteria STAPHYLOCOCCUS AUREUS   Final    Studies/Results: No results found.   Assessment/Plan: THR infection  Removal of hardware 4-29 MSSA bacteremia Dementia  Total days of antibiotics: 9 (ancef)  Ok to place [facility] after BCx repeated Need to repeat BCx.  Would plan for 6 weeks of ancef Repeat BCx again at end of tx Placement?         [facility] (pager)  [phone] [email or web address] 05/01/2014, 11:59 AM  LOS: 6 days   **Disclaimer: This note may have been dictated with voice recognition software. Similar sounding words can inadvertently be transcribed and this note may contain transcription errors which may not have been corrected upon publication of note.**

## 2014-05-02 ENCOUNTER — Inpatient Hospital Stay
Admission: RE | Admit: 2014-05-02 | Discharge: 2014-05-09 | Disposition: A | Discharge status: Hospice | Payer: Self-pay | Source: Ambulatory Visit | Attending: Internal Medicine | Admitting: Internal Medicine

## 2014-05-02 DIAGNOSIS — Y849 Medical procedure, unspecified as the cause of abnormal reaction of the patient, or of later complication, without mention of misadventure at the time of the procedure: Secondary | ICD-10-CM

## 2014-05-02 DIAGNOSIS — M869 Osteomyelitis, unspecified: Secondary | ICD-10-CM

## 2014-05-02 DIAGNOSIS — I129 Hypertensive chronic kidney disease with stage 1 through stage 4 chronic kidney disease, or unspecified chronic kidney disease: Secondary | ICD-10-CM

## 2014-05-02 DIAGNOSIS — F05 Delirium due to known physiological condition: Secondary | ICD-10-CM

## 2014-05-02 DIAGNOSIS — N182 Chronic kidney disease, stage 2 (mild): Secondary | ICD-10-CM

## 2014-05-02 LAB — GLUCOSE, CAPILLARY
GLUCOSE-CAPILLARY: 102 mg/dL — AB (ref 70–99)
GLUCOSE-CAPILLARY: 103 mg/dL — AB (ref 70–99)
GLUCOSE-CAPILLARY: 88 mg/dL (ref 70–99)

## 2014-05-02 MED ORDER — HYDROCODONE-ACETAMINOPHEN 5-325 MG PO TABS: 1.0000 | ORAL_TABLET | Freq: Four times a day (QID) | ORAL | Status: AC | PRN

## 2014-05-02 MED ORDER — LORAZEPAM 0.5 MG PO TABS
0.5000 mg | ORAL_TABLET | Freq: Two times a day (BID) | ORAL | Status: AC | PRN
Start: 2014-05-02 — End: ?

## 2014-05-02 MED ORDER — QUETIAPINE 12.5 MG HALF TABLET: 12.5000 mg | ORAL_TABLET | Freq: Every day | ORAL | Status: AC

## 2014-05-02 MED ORDER — ASPIRIN 325 MG PO TABS: 325.0000 mg | ORAL_TABLET | Freq: Every day | ORAL | Status: AC

## 2014-05-02 MED ORDER — HYDRALAZINE HCL 10 MG PO TABS: 10.0000 mg | ORAL_TABLET | Freq: Four times a day (QID) | ORAL | Status: AC

## 2014-05-02 MED ORDER — METOPROLOL TARTRATE 25 MG PO TABS: 25.0000 mg | ORAL_TABLET | Freq: Two times a day (BID) | ORAL | Status: AC

## 2014-05-02 MED ORDER — ACETAMINOPHEN 325 MG PO TABS: 650.0000 mg | ORAL_TABLET | Freq: Four times a day (QID) | ORAL | Status: AC | PRN

## 2014-05-02 MED ORDER — METOPROLOL TARTRATE 25 MG PO TABS
25.0000 mg | ORAL_TABLET | Freq: Two times a day (BID) | ORAL | Status: DC
  Administered 2014-05-02: 25 mg via ORAL
  Filled 2014-05-02 (×2): qty 1

## 2014-05-02 MED ORDER — CEFAZOLIN SODIUM-DEXTROSE 2-3 GM-% IV SOLR: 2.0000 g | Freq: Three times a day (TID) | INTRAVENOUS | Status: AC

## 2014-05-02 MED ORDER — PHENOL 1.4 % MT LIQD: 1.0000 | OROMUCOSAL | Status: AC | PRN

## 2014-05-02 NOTE — Discharge Summary (Signed)
Addendum  Patient seen and examined, chart and data base reviewed.  I agree with the above assessment and plan.  For full details please see Mrs. Imogene Burn PA note.  MSSA bacteremia and Pseudomonas infection (right periprosthetic hip), plan for 6 weeks of Ancef per ID recommendation.   Birdie Hopes, MD [facility] Pager: [phone] 05/02/2014, 12:53 PM

## 2014-05-02 NOTE — Discharge Summary (Signed)
Physician Discharge Summary  Michelle Yoder [id number] DOB: Jan 19, 1923 DOA: 04/25/2014  PCP: [name], MD  Admit date: 04/25/2014 Discharge date: 05/02/2014  Time spent: 50 minutes  Recommendations for Outpatient Follow-up:  1. Discharging to [facility]) 2. Will need on-going wound care services - QOD dry dressing change. Dr. Edmonia Lynch to guide wound care. 3. Patient with MSSA Bacteremia has PICC in place.  Needs PICC care and Ancef TID thru 06/08/2014. 4. Patient is a DNR.  Family a GOALS OF CARE consultation and MOST form completed. 5. Follow finalized blood cultures.  5/4 6. Bmet, cbc in 1 week. 7. Follow up with Dr. Percell Miller in 2 weeks.  Discharge Diagnoses:  Active Problems:   Diabetes mellitus without complication   Hypertension   RA (rheumatoid arthritis)   Chronic use of steroids   Infected prosthetic hip   Dementia with behavioral disturbance   Staphylococcus aureus bacteremia   Sinus tachycardia   CKD (chronic kidney disease) stage 2, GFR 60-89 ml/min   Discharge Condition: stable.   Diet recommendation: Heart healthy as tolerated.   Supplement with Resources breeze or Glucerna TID  Filed Weights   04/28/14 0400 04/30/14 0408 05/01/14 2141  Weight: 67 kg (147 lb 11.3 oz) 72.3 kg (159 lb 6.3 oz) 75.3 kg (166 lb 0.1 oz)    History of present illness:  This is a 80 y.o. year old female with PMH of left hip fracture status post repair in March 2015, rheumatoid arthritis, dementia, and hypertension, who presented with sepsis, left hip abscess, and anemia. The patient was noted to have fallen and broken her left hip about 6 weeks ago at ALF. No imaging was done for several days.  Once imaging was obtained and the fracture was discovered, the patient was directed to [facility]. Patient had surgical repair of left hip on March 12. Since that time, patient has had recurrent episodes of fever and confusion. She was last admitted April 4 -10 for  SIRS and delirium. Her left hip wound culture that grew out pseudomonas that was pan-sensitive.  She was discharged on 10 days of cipro to SNF. She completed the course of cipro.  Per the daughter, pt had fever within last 24 hours. Frank pus was noted to be draining from hip wound site. Raliegh Ip directed the patient to the ER.  Daughter also reports some  left sided visual loss since L hip fracture. Denies any overt hemiparesis.   In the emergency department the patient's white count was noted to be 15,500, her hemoglobin was low at 6.5. She had a temp of 100.2. Her lactate was high normal at 1.9. Plain x-rays of the left hip and chest x-ray were normal. The emergency department discussed the findings with Dr. Percell Miller the orthopedic surgeon and the initial plan was to plan for intraoperative incision and drainage of the left hip wound the following morning. After admission her fever climbed to 102.2 and she developed tachycardia and concerns were that the patient was evolving into a septic shock.  She was taken to the operating room on 04/26/2014 for hardware removal, irrigation and debridement of the hip with antibiotic bead insertion. She was able to be extubated postoperatively. She had low urine output and appeared to be dehydrated so was given additional IV fluids. Blood cultures obtained in the emergency department were positive for gram-positive cocci. Infectious disease service was consulted.   Postoperatively she developed acute delirium in the setting of known dementia and issues with  sepsis but at the same time her hemodynamic status stabilized. She had persistent sinus tachycardia related to ongoing agitation in the setting of known sepsis. She was able to be transitioned out of the ICU. Subsequent blood cultures were positive for MSSA in 2 out of 2 bottles. Intraoperative tissue cultures grew out the same organism.    Hospital Course:  Infected prosthetic hip  -Hardware removal with  irrigation and debridement of left hip and antibiotic bead insertion 04/26/2014 -post op care per Orthopedic service, Dr. Idolina Primer.   PICC line placed 05/01/14.    Staphylococcus aureus bacteremia with sepsis  -hemodynamically stable  -ID consulted. -TTE w/ no evidence of vegetations - not optimal candidate for TEE  -Repeat blood cultures drawn 5/4 and show no growth to date. -Now safe for PICC line placement  -Plan for 6 weeks total of Ancef IV therapy thru June 11.  Rheumatoid arthritis / Chronic use of steroids  -on low dose Prednisone at home BID  -cont usual steroid dosing  -DMARD discontinued.  Dementia with behavioral disturbance  -experiencing acute delirium, but seems to be improved.  On Seroquel QHS -cont prn restraints for pt safety/protection of equipement   Sinus tachycardia  -likely BB withdrawal - resolved   Diabetes mellitus  -CBG reasonably controlled with diet  Hypertension  -BP intermittently elevated.  On Hydralazine 10 mg q 6 hours.  Have increased metoprolol to 25 bid.  -May require further adjustments.  Chronic kidney disease stage 2, GFR 60-89 ml/min  -crt stable   Malnutrition Patient eating poorly  Albumin 1.3.  Diffuse anasarca Protein supplements recommended.  Social: Patient lost her husband in 01/2014.  Primary care taker is DTR Michelle Yoder.  Per Michelle Yoder, the patient is a DNR.  Patient is from [facility].  Very happy with care there. Palliative care consultation and possibly hospice is needed.   Procedures: Hardware removal with irrigation and debridement of left hip and antibiotic bead insertion 04/26/2014   Consultations:  Orthopedics  Infectious  Discharge Exam: Filed Vitals:   05/02/14 0621  BP: 199/74  Pulse:   Temp:   Resp:     General: Elderly female, sleeping, does not awaken to voice or exam.  Mits on hands. Dtr at bedside. Lungs: no w/c/r, no increased wob. Cardiovascular: Regular rate and rhythm without murmur  gallop rub  Abdomen: Nontender, nondistended, soft, bowel sounds positive, no rebound, no ascites, no appreciable mass  Musculoskeletal: No significant cyanosis, clubbing of bilateral lower extremities - 1-2+ B LE edema.  Anasarca noted in all 4 extremities.   Discharge Instructions       Discharge Orders   Future Appointments Provider Department Dept Phone   07/10/2014 9:30 AM Newt Lukes, MD [facility] [phone]   Future Orders Complete By Expires   Diet - low sodium heart healthy  As directed    Increase activity slowly  As directed        Medication List    STOP taking these medications       cephALEXin 500 MG capsule  Commonly known as:  KEFLEX     leflunomide 20 MG tablet  Commonly known as:  ARAVA     risperiDONE 0.25 MG tablet  Commonly known as:  RISPERDAL     sitaGLIPtin 25 MG tablet  Commonly known as:  JANUVIA      TAKE these medications       acetaminophen 325 MG tablet  Commonly known as:  TYLENOL  Take 2 tablets (650 mg total) by mouth every 6 (six) hours as needed for mild pain, fever or headache.     aspirin 325 MG tablet  Take 1 tablet (325 mg total) by mouth daily.     ceFAZolin 2-3 GM-% Solr  Commonly known as:  ANCEF  Inject 50 mLs (2 g total) into the vein every 8 (eight) hours.     citalopram 10 MG tablet  Commonly known as:  CELEXA  Take 10 mg by mouth daily.     docusate sodium 100 MG capsule  Commonly known as:  COLACE  Take 1 capsule (100 mg total) by mouth 2 (two) times daily.     feeding supplement (GLUCERNA SHAKE) Liqd  Take 237 mLs by mouth 2 (two) times daily between meals.     ferrous sulfate 325 (65 FE) MG tablet  Take 325 mg by mouth daily with breakfast.     hydrALAZINE 10 MG tablet  Commonly known as:  APRESOLINE  Take 1 tablet (10 mg total) by mouth every 6 (six) hours.     HYDROcodone-acetaminophen 5-325 MG per tablet  Commonly known as:  NORCO  Take 1 tablet by mouth every  6 (six) hours as needed for moderate pain.     lidocaine 5 %  Commonly known as:  LIDODERM  Place 1 patch onto the skin daily. Remove & Discard patch within 12 hours or as directed by MD     LORazepam 0.5 MG tablet  Commonly known as:  ATIVAN  Take 1 tablet (0.5 mg total) by mouth every 12 (twelve) hours as needed for anxiety.     magnesium oxide 400 (241.3 MG) MG tablet  Commonly known as:  MAGOX 400  Take 1 tablet (400 mg total) by mouth daily.     Memantine HCl ER 7 MG Cp24  Take 1 capsule by mouth daily with breakfast.     metoprolol tartrate 25 MG tablet  Commonly known as:  LOPRESSOR  Take 1 tablet (25 mg total) by mouth 2 (two) times daily.     MI-ACID GAS RELIEF 80 MG chewable tablet  Generic drug:  simethicone  Chew 80 mg by mouth 2 (two) times daily.     omeprazole 20 MG capsule  Commonly known as:  PRILOSEC  Take 20 mg by mouth daily.     phenol 1.4 % Liqd  Commonly known as:  CHLORASEPTIC  Use as directed 1 spray in the mouth or throat as needed for throat irritation / pain.     predniSONE 2.5 MG tablet  Commonly known as:  DELTASONE  Take 2.5-5 mg by mouth 2 (two) times daily. Give 2 tablets (5 mg) in the morning and 1 tablet (2.5 mg) in the evening     QUEtiapine 12.5 mg Tabs tablet  Commonly known as:  SEROQUEL  Take 0.5 tablets (12.5 mg total) by mouth at bedtime.     THERADEX M PO  Take 1 tablet by mouth daily.     VITAMIN B COMPLEX PO  Take 1 capsule by mouth daily.     vitamin C 500 MG tablet  Commonly known as:  ASCORBIC ACID  Take 500 mg by mouth daily.     Vitamin D-3 1000 UNITS Caps  Take 1 capsule by mouth daily.       No Known Allergies Follow-up Information   Follow up with MURPHY, TIMOTHY, D, MD In 2 weeks. (Wound care of left hip)    Specialty:  Orthopedic Surgery   Contact information:   [address] [place] [address] [phone]        The results of significant diagnostics from this  hospitalization (including imaging, microbiology, ancillary and laboratory) are listed below for reference.    Significant Diagnostic Studies: Dg Chest 1 View  04/25/2014   CLINICAL DATA:  infection  EXAM: CHEST - 1 VIEW  COMPARISON:  DG CHEST 1V PORT dated 04/03/2014  FINDINGS: The heart size and mediastinal contours are within normal limits. Both lungs are clear. Degenerative changes are appreciated within the left shoulder.  IMPRESSION: No active disease.   Electronically Signed   By: Salome Holmes M.D.   On: 04/25/2014 17:34   Dg Hip Complete Left  04/25/2014   CLINICAL DATA:  infection, pain  EXAM: LEFT HIP - COMPLETE 2+ VIEW  COMPARISON:  DG HIP PORTABLE 1 VIEW*L* dated 03/09/2014; DG HIP COMPLETE*L* dated 03/08/2014  FINDINGS: There is no evidence of hip fracture or dislocation. There is no evidence of arthropathy or other focal bone abnormality. The patient's total left hip arthroplasty is intact without evidence of loosening or failure. There is no evidence of subcutaneous emphysema nor evidence of cortical irregularity.  IMPRESSION: Negative.   Electronically Signed   By: Salome Holmes M.D.   On: 04/25/2014 17:33    Dg Pelvis Portable  04/26/2014   CLINICAL DATA:  postop  EXAM: PORTABLE PELVIS 1-2 VIEWS  COMPARISON:  CT ABD/PELVIS W CM dated 04/03/2014; CT HIP*L* W/O CM dated 03/08/2014  FINDINGS: The patient is status post left hip prostheses removal. Osteotomy changes along the base of the femoral neck. Multiple radiodense antibiotic beads are appreciated within the left femoral acetabular joint. Surgical drains are appreciated within the left hip. There is no evidence of fracture. The proximal femur is laterally displaced. Degenerative changes are appreciated within the lower lumbar spine and sacroiliac joints.  IMPRESSION: Postsurgical changes left hip. Otherwise no further acute osseous abnormalities.  Degenerative changes within the lower lumbar spine and sacroiliac joints.   Electronically  Signed   By: Salome Holmes M.D.   On: 04/26/2014 11:56   Dg Chest Port 1 View  04/26/2014   CLINICAL DATA:  Assess central line placement.  EXAM: PORTABLE CHEST - 1 VIEW  COMPARISON:  DG CHEST 1 VIEW dated 04/25/2014  FINDINGS: The patient has undergone placement of a left internal jugular venous catheter. The tip lies in the region of the distal SVC. There is no evidence of postprocedure pneumothorax or pneumothorax. The cardiopericardial silhouette is top-normal in size. The pulmonary vascularity is and not engorged. The lungs are adequately inflated and clear. There are degenerative changes of both shoulders.  IMPRESSION: There is no evidence of a postprocedure complication following placement of the left internal jugular venous catheter.   Electronically Signed   By: David  [place]   On: 04/26/2014 01:24      Microbiology: Recent Results (from the past 240 hour(s))  CULTURE, BLOOD (ROUTINE X 2)     Status: None   Collection Time    04/25/14  6:50 PM      Result Value Ref Range Status   Specimen Description BLOOD HAND RIGHT   Final   Special Requests BOTTLES DRAWN AEROBIC AND ANAEROBIC 5CC   Final   Culture  Setup Time     Final   Value: 04/26/2014 00:54     Performed at [facility]   Culture     Final  Value: STAPHYLOCOCCUS AUREUS     Note: RIFAMPIN AND GENTAMICIN SHOULD NOT BE USED AS SINGLE DRUGS FOR TREATMENT OF STAPH INFECTIONS. This organism DOES NOT demonstrate inducible Clindamycin resistance in vitro.     Note: Gram Stain Report Called to,Read Back By and Verified With: NICOLE Z BY INGRAM A 3PM 04/26/14     Performed at [facility]   Report Status 04/28/2014 FINAL   Final   Organism ID, Bacteria STAPHYLOCOCCUS AUREUS   Final  URINE CULTURE     Status: None   Collection Time    04/25/14  7:26 PM      Result Value Ref Range Status   Specimen Description URINE, CATHETERIZED   Final   Special Requests Normal   Final   Culture  Setup Time     Final   Value:  04/26/2014 01:02     Performed at [place]     Final   Value: NO GROWTH     Performed at [facility]   Culture     Final   Value: NO GROWTH     Performed at [facility]   Report Status 04/26/2014 FINAL   Final  MRSA PCR SCREENING     Status: None   Collection Time    04/25/14 11:53 PM      Result Value Ref Range Status   MRSA by PCR NEGATIVE  NEGATIVE Final   Comment:            The GeneXpert MRSA Assay (FDA     approved for NASAL specimens     only), is one component of a     comprehensive MRSA colonization     surveillance program. It is not     intended to diagnose MRSA     infection nor to guide or     monitor treatment for     MRSA infections.  CULTURE, BLOOD (ROUTINE X 2)     Status: None   Collection Time    04/26/14  1:10 AM      Result Value Ref Range Status   Specimen Description BLOOD CENTRAL LINE   Final   Special Requests     Final   Value: BOTTLES DRAWN AEROBIC AND ANAEROBIC AER 10CC ANA 2CC   Culture  Setup Time     Final   Value: 04/26/2014 08:00     Performed at [facility]   Culture     Final   Value: STAPHYLOCOCCUS AUREUS     Note: RIFAMPIN AND GENTAMICIN SHOULD NOT BE USED AS SINGLE DRUGS FOR TREATMENT OF STAPH INFECTIONS. This organism DOES NOT demonstrate inducible Clindamycin resistance in vitro.     Note: Gram Stain Report Called to,Read Back By and Verified With: Dorena Bodo 04/29/14 @ 11:46AM BY RUSCOE A.     Performed at [facility]   Report Status 05/01/2014 FINAL   Final   Organism ID, Bacteria STAPHYLOCOCCUS AUREUS   Final  ANAEROBIC CULTURE     Status: None   Collection Time    04/26/14  8:58 AM      Result Value Ref Range Status   Specimen Description TISSUE HIP LEFT   Final   Special Requests LEFT DEEP TISSUE PT ON VANCO   Final   Gram Stain     Final   Value: ABUNDANT WBC PRESENT, PREDOMINANTLY PMN     NO SQUAMOUS EPITHELIAL CELLS SEEN     RARE Lonell Grandchild  POSITIVE COCCI      IN PAIRS     Performed at [facility]   Culture     Final   Value: NO ANAEROBES ISOLATED     Performed at [facility]   Report Status 05/01/2014 FINAL   Final  TISSUE CULTURE     Status: None   Collection Time    04/26/14  8:58 AM      Result Value Ref Range Status   Specimen Description TISSUE HIP LEFT   Final   Special Requests LEFT DEEP TISSUE PT ON VANCO   Final   Gram Stain     Final   Value: ABUNDANT WBC PRESENT, PREDOMINANTLY PMN     NO SQUAMOUS EPITHELIAL CELLS SEEN     RARE GRAM POSITIVE COCCI     IN PAIRS     Performed at [facility]   Culture     Final   Value: FEW STAPHYLOCOCCUS AUREUS     Note: RIFAMPIN AND GENTAMICIN SHOULD NOT BE USED AS SINGLE DRUGS FOR TREATMENT OF STAPH INFECTIONS. This organism DOES NOT demonstrate inducible Clindamycin resistance in vitro.     Performed at [facility]   Report Status 04/28/2014 FINAL   Final   Organism ID, Bacteria STAPHYLOCOCCUS AUREUS   Final  CULTURE, BLOOD (ROUTINE X 2)     Status: None   Collection Time    05/01/14  1:03 PM      Result Value Ref Range Status   Specimen Description BLOOD RIGHT HAND   Final   Special Requests BOTTLES DRAWN AEROBIC ONLY 5CC   Final   Culture  Setup Time     Final   Value: 05/01/2014 17:22     Performed at [facility]   Culture     Final   Value:        BLOOD CULTURE RECEIVED NO GROWTH TO DATE CULTURE WILL BE HELD FOR 5 DAYS BEFORE ISSUING A FINAL NEGATIVE REPORT     Performed at [facility]   Report Status PENDING   Incomplete  CULTURE, BLOOD (ROUTINE X 2)     Status: None   Collection Time    05/01/14  1:07 PM      Result Value Ref Range Status   Specimen Description BLOOD RIGHT ANTECUBITAL   Final   Special Requests BOTTLES DRAWN AEROBIC ONLY 3CC   Final   Culture  Setup Time     Final   Value: 05/01/2014 17:21     Performed at [facility]   Culture     Final   Value:        BLOOD CULTURE RECEIVED NO GROWTH TO DATE  CULTURE WILL BE HELD FOR 5 DAYS BEFORE ISSUING A FINAL NEGATIVE REPORT     Performed at [facility]   Report Status PENDING   Incomplete     Labs: Basic Metabolic Panel:  Recent Labs Lab 04/26/14 1620 04/27/14 0310 04/28/14 0545 04/29/14 0600 04/30/14 0551  NA 143 142 141 145 143  K 4.0 4.1 4.6 3.7 3.8  CL 113* 112 109 114* 111  CO2 20 18* 19 20 21   GLUCOSE 86 65* 77 96 112*  BUN 23 21 18 15 16   CREATININE 0.73 0.74 0.67 0.64 0.66  CALCIUM 8.7 9.1 9.1 9.1 9.2  MG  --   --  1.7  --   --   PHOS  --   --  1.2*  --   --  Liver Function Tests:  Recent Labs Lab 04/25/14 1543 04/26/14 0450 04/27/14 0310 04/28/14 0545 04/29/14 0600  AST 43* 26 36 67* 44*  ALT 28 21 25 22 6   ALKPHOS 250* 173* 211* 314* 466*  BILITOT 0.7 0.6 0.7 0.6 0.4  PROT 5.7* 4.5* 5.2* 5.2* 4.6*  ALBUMIN 1.7* 1.4* 1.4* 1.6* 1.3*   CBC:  Recent Labs Lab 04/25/14 1543 04/26/14 0450 04/26/14 1004 04/27/14 0310 04/28/14 0545 04/29/14 0600 04/30/14 0551  WBC 15.5* 9.9  --  11.6* 10.8* 10.2 8.8  NEUTROABS 13.4*  --   --   --   --   --   --   HGB 6.5* 7.8* 15.0 12.8 12.9 11.6* 11.9*  HCT 20.1* 24.1* 44.2 37.6 37.6 33.9* 36.3  MCV 93.9 93.8  --  89.7 88.9 88.7 90.3  PLT 402* 244  --  228 229 231 210   Cardiac Enzymes:  Recent Labs Lab 04/25/14 1543  TROPONINI <0.30   BNP: BNP (last 3 results)  Recent Labs  04/25/14 1543  PROBNP 4705.0*   CBG:  Recent Labs Lab 05/01/14 0746 05/01/14 1125 05/01/14 1734 05/02/14 0005 05/02/14 0755  GLUCAP 104* 138* 169* 102* 88       Signed:  Karen Kitchens [phone]  [facility] 05/02/2014, 12:19 PM

## 2014-05-02 NOTE — Progress Notes (Signed)
NURSING PROGRESS NOTE  Michelle Yoder [id number] Transfer Data: 05/02/2014 3:48 AM Attending Provider: Cherene Altes, MD TDH:RCBULAG Asa Lente, MD Code Status: FULL CODE  Michelle Yoder is a 80 y.o. female patient transferred from =2C  -No acute distress noted. -No complaints of shortness of breath.  -No complaints of chest pain.   Cardiac Monitoring: Box NA in place. Cardiac monitor; ;N/A  Last Documented Vital Signs: Blood pressure 152/73, pulse 103, temperature 97.6 F (36.4 C), temperature source Oral, resp. rate 16, height 5' 4.17" (1.63 m), weight 75.3 kg (166 lb 0.1 oz), SpO2 94.00%.  IV Fluids:  IV in place, occlusive dsg intact without redness, IV cath antecubital right, condition patent and no redness normal saline. @KVO    Allergies:  Review of patient's allergies indicates no known allergies.  Past Medical History:   has a past medical history of Arthritis; Gout; Osteoarthritis; Diabetes mellitus without complication; Hypertension; Dementia; Cancer; Anemia; and GERD (gastroesophageal reflux disease).  Past Surgical History:   has past surgical history that includes Mastectomy; Abdominal hysterectomy; Hip Arthroplasty (Left, 03/09/2014); Hardware Removal (Left, 04/26/2014); and Incision and drainage hip (Left, 04/26/2014).  Social History:   reports that she has quit smoking. She does not have any smokeless tobacco history on file. She reports that she does not drink alcohol or use illicit drugs.  Skin:w/ LT hip incision with  Drain & connected to low interm.suction  Patient/Family orientated to room. Information packet given to patient/family. Admission inpatient armband information verified with patient/family to include name and date of birth and placed on patient arm. Side rails up x 2, fall assessment and education completed with patient/family. Patient/family able to verbalize understanding of risk associated with falls and verbalized understanding to call for  assistance before getting out of bed. Call light within reach. Patient/family able to voice and demonstrate understanding of unit orientation instructions.    Will continue to evaluate and treat per MD orders.

## 2014-05-02 NOTE — Progress Notes (Signed)
NUTRITION FOLLOW-UP  DOCUMENTATION CODES Per approved criteria  -Not Applicable   INTERVENTION: Provide Glucerna Shakes BID. Provide Magic cup TID with meals, each supplement provides 290 kcal and 9 grams of protein. If aggressive nutrition therapy desired, recommend initiation of nutrition support given ongoing poor oral intake/meal refusals. Monitor magnesium, potassium, and phosphorus daily for at least 3 days, MD to replete as needed, as pt is at risk for refeeding syndrome given ongoing poor oral intake. RD to continue to monitor.  NUTRITION DIAGNOSIS: Predicted suboptimal energy intake related to AMS and dementia as evidenced by pt refusing meals. Ongoing.  Goal: Pt to meet >/= 90% of their estimated nutrition needs - unmet  Monitor:  PO intake, weight trend, labs  ASSESSMENT: 80 y.o. female recently admitted for L hip fx in March. who presents to the ED with fever and drainage from hip wound.  Work-up reveals sepsis.  Ortho evaluated pt - dtr is agreeable to surgery for pt. Underwent HARDWARE REMOVAL, IRRIGATION AND DEBRIDEMENT HIP WITH ANTIBIOTIC BEAD INSERTION on 4/29.   Developed tachycardia and recurrent fever on 4/29. Also developed post-op delirium.  BSE completed by SLP on 5/1 - recommendations for Dysphagia 1 diet with thin liquids. Pt is eating very little. Per RN, pt refusing several meals and needs max encouragement to take oral medications. Pt was able to eat about 1/3 of a Magic Cup today for breakfast.  Potassium WNL Magnesium WNL Phosphorus low at 1.2  Height: Ht Readings from Last 1 Encounters:  04/25/14 5' 4.17" (1.63 m)    Weight: Wt Readings from Last 1 Encounters:  05/01/14 166 lb 0.1 oz (75.3 kg)  Admit wt 130 lb  BMI:  22.2 - using admission weight - Normal  Estimated Nutritional Needs: Kcal: 1600-1800 Protein: 75-90 grams Fluid: 1.6-1.8 L/day  Skin: left hip incision  Diet Order: Dysphagia 1 with thin liquids   Intake/Output  Summary (Last 24 hours) at 05/02/14 0829 Last data filed at 05/02/14 0545  Gross per 24 hour  Intake   1280 ml  Output   3000 ml  Net  -1720 ml    Last BM: 5/4  Labs:   Recent Labs Lab 04/27/14 0310 04/28/14 0545 04/29/14 0600 04/30/14 0551  NA 142 141 145 143  K 4.1 4.6 3.7 3.8  CL 112 109 114* 111  CO2 18* 19 20 21   BUN 21 18 15 16   CREATININE 0.74 0.67 0.64 0.66  CALCIUM 9.1 9.1 9.1 9.2  MG  --  1.7  --   --   PHOS  --  1.2*  --   --   GLUCOSE 65* 77 96 112*    CBG (last 3)   Recent Labs  05/01/14 1734 05/02/14 0005 05/02/14 0755  GLUCAP 169* 102* 88    Scheduled Meds: . aspirin  325 mg Oral Daily  .  ceFAZolin (ANCEF) IV  2 g Intravenous 3 times per day  . citalopram  10 mg Oral Daily  . feeding supplement (GLUCERNA SHAKE)  237 mL Oral BID BM  . hydrALAZINE  10 mg Oral 4 times per day  . Memantine HCl ER  1 capsule Oral Q breakfast  . metoprolol tartrate  25 mg Oral BID  . predniSONE  2.5 mg Oral QHS  . predniSONE  5 mg Oral Q breakfast  . QUEtiapine  12.5 mg Oral QHS  . simethicone  80 mg Oral BID  . sodium chloride  10-40 mL Intracatheter Q12H    Continuous Infusions: .  sodium chloride 40 mL/hr at 05/01/14 0400    Inda Coke MS, RD, LDN Inpatient Registered Dietitian Pager: [phone] After-hours pager: [phone]

## 2014-05-02 NOTE — Care Management Note (Signed)
    Page 1 of 1   05/02/2014     2:25:28 PM CARE MANAGEMENT NOTE 05/02/2014  Patient:  Michelle Yoder, Michelle Yoder   Account Number:  [id number]  Date Initiated:  04/28/2014  Documentation initiated by:  MAYO,HENRIETTA  Subjective/Objective Assessment:   dx infected hip prosthesis; pt is residing @ [facility]     Action/Plan:   Anticipated DC Date:  05/02/2014   Anticipated DC Plan:  LONG TERM ACUTE CARE (LTAC)  In-house referral  Clinical Social Worker      DC Planning Services  CM consult      Choice offered to / List presented to:             Status of service:  Completed, signed off Medicare Important Message given?  YES (If response is "NO", the following Medicare IM given date fields will be blank) Date Medicare IM given:  05/02/2014 Date Additional Medicare IM given:  05/02/2014  Discharge Disposition:  LONG TERM ACUTE CARE (LTAC)  Per UR Regulation:  Reviewed for med. necessity/level of care/duration of stay  If discussed at [place] of Stay Meetings, dates discussed:    Comments:  05/02/14 [place], BSN [phone] NCM received hand off from previous NCM, stated made ltac referral on 5/4, received call from [place] with Ltac stating patient is appropriate and they can take her today.  NCM called daughter, Michelle Yoder, she stated she would like for patient to go to [facility] because it is here in the hospital. NCM informed MD and Sonia Baller with [facility].  Patient for dc to [facility] today.  05/01/14 [place] MSN BSN CCM Requested LTAC evals.

## 2014-05-02 NOTE — Progress Notes (Signed)
DC to [facility] at this time.

## 2014-05-02 NOTE — Progress Notes (Signed)
Report called to [facility]. Spoke with Jeanene Erb.

## 2014-05-02 NOTE — Progress Notes (Signed)
Physical Therapy Treatment Patient Details Name: Michelle Yoder MRN: [id number] DOB: 06/27/1923 Today's Date: 05/02/2014    History of Present Illness 80 yo female admitted from [facility] in [place] with Sepsis ( tachycardia, fever) and underwent 4/29 I& D with hardware removal to Lt HIp. Pt with x2 recent admissions. Pt was able to walk with cane and walker but sustained fall in [place] living at ALF. Pt underwent surg 3/15 for LT THA. Pt d/c from [facility] to [facility]F 3/18. Pt readmitted on 4/5 from [facility] s/p fall from w/c SIRS fever delirium. Pt with recent passing of her husband. PT d/c from [facility] to [facility] [place] [facility] 4/10. hx of mild dementia, RA, HTN, DM Gout . Per progress notes: daughter notes visual changes since 3/15 surg    PT Comments    Pt not progressing with mobility at this date.  Pt not participating at all during therapy.  Attempted to have pt sit EOB but unsuccessful therefore repositioned pt back in supine.    Follow Up Recommendations  SNF;Supervision/Assistance - 24 hour     Equipment Recommendations  None recommended by PT    Recommendations for Other Services OT consult     Precautions / Restrictions Precautions Precautions: Fall (no longer has hardware; posterior hip precautions) Precaution Comments: Drain and suction to wall as wound vac starting 4/29 and to sustain wound vac suction for 7-10 days per chart Required Braces or Orthoses: Knee Immobilizer - Left Knee Immobilizer - Left: On at all times Restrictions LLE Weight Bearing: Non weight bearing    Mobility  Bed Mobility Overal bed mobility: Needs Assistance;+2 for physical assistance Bed Mobility: Rolling (pulling up to [facility]) Rolling: Total assist;+2 for physical assistance         General bed mobility comments:  Attempted to bring pt to sitting at EOB but pt not participating at all therefore returned back to supine.    Transfers                     Ambulation/Gait                 Stairs            Wheelchair Mobility    Modified Rankin (Stroke Patients Only)       Balance                                    Cognition Arousal/Alertness: Lethargic Behavior During Therapy: Flat affect Overall Cognitive Status: History of cognitive impairments - at baseline                      Exercises      General Comments        Pertinent Vitals/Pain Yells out in pain with movement.      Home Living                      Prior Function            PT Goals (current goals can now be found in the care plan section) Acute Rehab PT Goals PT Goal Formulation: Patient unable to participate in goal setting Time For Goal Achievement: 05/11/14 Potential to Achieve Goals: Fair Progress towards PT goals: Not progressing toward goals - comment    Frequency  Min 2X/week    PT Plan Current plan remains  appropriate    Co-evaluation             End of Session Equipment Utilized During Treatment: Left knee immobilizer;Oxygen Activity Tolerance: Patient limited by lethargy;Patient limited by pain Patient left: in bed;with call bell/phone within reach;with bed alarm set     Time: 6503-5465 PT Time Calculation (min): 14 min  Charges:  $Therapeutic Activity: 8-22 mins                      Sena Hitch 05/02/2014, 12:03 PM   Sarajane Marek, PTA [phone] 05/02/2014

## 2014-05-02 NOTE — Progress Notes (Signed)
Occupational Therapy Treatment Patient Details Name: Yexalen Deike MRN: [id number] DOB: 13-Oct-1923 Today's Date: 05/02/2014    History of present illness 80 yo female admitted from [facility] in [place] with Sepsis ( tachycardia, fever) and underwent 4/29 I& D with hardware removal to Lt HIp. Pt with x2 recent admissions. Pt was able to walk with cane and walker but sustained fall in [place] living at ALF. Pt underwent surg 3/15 for LT THA. Pt d/c from [facility] to [facility] 3/18. Pt readmitted on 4/5 from [facility] s/p fall from w/c SIRS fever delirium. Pt with recent passing of her husband. PT d/c from [facility] to [facility] [place] [facility] 4/10. hx of mild dementia, RA, HTN, DM Gout . Per progress notes: daughter notes visual changes since 3/15 surg   OT comments  Pt unable to participate in grooming tasks at bed level due to lethargy.  Not following commands.  Requiring +2 total assist for bed mobility and positioning.  Follow Up Recommendations  SNF;Supervision/Assistance - 24 hour    Equipment Recommendations       Recommendations for Other Services      Precautions / Restrictions Precautions Precautions: Fall Precaution Comments: Drain and suction to wall as wound vac starting 4/29 and to sustain wound vac suction for 7-10 days per chart Required Braces or Orthoses: Knee Immobilizer - Left Knee Immobilizer - Left: On at all times Restrictions LLE Weight Bearing: Non weight bearing       Mobility Bed Mobility Overal bed mobility: Needs Assistance;+2 for physical assistance Bed Mobility: Rolling (pulling up to [facility]) Rolling: Total assist;+2 for physical assistance           Transfers                      Balance                                   ADL Overall ADL's : Needs assistance/impaired     Grooming: Wash/dry hands;Wash/dry face;Brushing hair;Total assistance;Bed level                                 General ADL  Comments: Pt requires total care. Very lethargic and unable to participate.      Vision                     Perception     Praxis      Cognition   Behavior During Therapy: Flat affect Overall Cognitive Status: History of cognitive impairments - at baseline       Memory: Decreased short-term memory;Decreased recall of precautions               Extremity/Trunk Assessment               Exercises     Shoulder Instructions       General Comments      Pertinent Vitals/ Pain       Pain in L hip with movement, unable to rate  Home Living                                          Prior Functioning/Environment  Frequency Min 2X/week     Progress Toward Goals  OT Goals(current goals can now be found in the care plan section)  Progress towards OT goals: Not progressing toward goals - comment (lethargic)     Plan Discharge plan remains appropriate    Co-evaluation          OT goals addressed during session: ADL's and self-care      End of Session     Activity Tolerance Patient limited by pain   Patient Left in bed;with call bell/phone within reach   Nurse Communication          Time: 1110-1130 OT Time Calculation (min): 20 min  Charges: OT General Charges $OT Visit: 1 Procedure OT Treatments $Self Care/Home Management : 8-22 mins  Haze Boyden Dontavion Noxon 05/02/2014, 2:34 PM [phone]

## 2014-05-02 NOTE — Progress Notes (Signed)
INFECTIOUS DISEASE PROGRESS NOTE  ID: Michelle Yoder is a 80 y.o. female with  Active Problems:   Diabetes mellitus without complication   Hypertension   RA (rheumatoid arthritis)   Chronic use of steroids   Infected prosthetic hip   Dementia with behavioral disturbance   Staphylococcus aureus bacteremia   Sinus tachycardia   CKD (chronic kidney disease) stage 2, GFR 60-89 ml/min  Subjective: Sleeping.  Abtx:  Anti-infectives   Start     Dose/Rate Route Frequency Ordered Stop   05/02/14 0000  ceFAZolin (ANCEF) 2-3 GM-% SOLR     2 g 100 mL/hr over 30 Minutes Intravenous Every 8 hours 05/02/14 1046 06/08/14 2359   04/27/14 0930  ceFAZolin (ANCEF) IVPB 2 g/50 mL premix     2 g 100 mL/hr over 30 Minutes Intravenous 3 times per day 04/27/14 0920 06/08/14 2359   04/26/14 1400  ceFEPIme (MAXIPIME) 1 g in dextrose 5 % 50 mL IVPB  Status:  Discontinued     1 g 100 mL/hr over 30 Minutes Intravenous Every 12 hours 04/26/14 1358 04/27/14 0920   04/26/14 1145  ceFAZolin (ANCEF) IVPB 2 g/50 mL premix  Status:  Discontinued     2 g 100 mL/hr over 30 Minutes Intravenous Every 6 hours 04/26/14 1142 04/26/14 1152   04/26/14 0916  gentamicin (GARAMYCIN) injection  Status:  Discontinued       As needed 04/26/14 0917 04/26/14 0947   04/26/14 0916  vancomycin (VANCOCIN) powder  Status:  Discontinued       As needed 04/26/14 0919 04/26/14 0947   04/25/14 2200  vancomycin (VANCOCIN) 500 mg in sodium chloride 0.9 % 100 mL IVPB  Status:  Discontinued     500 mg 100 mL/hr over 60 Minutes Intravenous Every 12 hours 04/25/14 2106 04/28/14 0955   04/25/14 2200  ceFEPIme (MAXIPIME) 1 g in dextrose 5 % 50 mL IVPB  Status:  Discontinued     1 g 100 mL/hr over 30 Minutes Intravenous Every 24 hours 04/25/14 2106 04/26/14 1358   04/25/14 1630  vancomycin (VANCOCIN) IVPB 1000 mg/200 mL premix  Status:  Discontinued     1,000 mg 200 mL/hr over 60 Minutes Intravenous  Once 04/25/14 1626 04/25/14 1758   04/25/14 1630  ceFEPIme (MAXIPIME) 1 g in dextrose 5 % 50 mL IVPB  Status:  Discontinued     1 g 100 mL/hr over 30 Minutes Intravenous  Once 04/25/14 1626 04/25/14 1758      Medications:  Scheduled: . aspirin  325 mg Oral Daily  .  ceFAZolin (ANCEF) IV  2 g Intravenous 3 times per day  . citalopram  10 mg Oral Daily  . feeding supplement (GLUCERNA SHAKE)  237 mL Oral BID BM  . hydrALAZINE  10 mg Oral 4 times per day  . Memantine HCl ER  1 capsule Oral Q breakfast  . metoprolol tartrate  25 mg Oral BID  . predniSONE  2.5 mg Oral QHS  . predniSONE  5 mg Oral Q breakfast  . QUEtiapine  12.5 mg Oral QHS  . simethicone  80 mg Oral BID  . sodium chloride  10-40 mL Intracatheter Q12H    Objective: Vital signs in last 24 hours: Temp:  [97.6 F (36.4 C)-98.5 F (36.9 C)] 98 F (36.7 C) (05/05 0439) Pulse Rate:  [86-104] 99 (05/05 0439) Resp:  [15-20] 18 (05/05 0439) BP: (130-217)/(58-82) 199/74 mmHg (05/05 0621) SpO2:  [93 %-95 %] 94 % (05/05 0439) Weight:  [75.3  kg (166 lb 0.1 oz)] 75.3 kg (166 lb 0.1 oz) (05/04 2141)   General appearance: no distress and sleeping.  Incision/Wound: L hip dressed, clean.   Lab Results  Recent Labs  04/30/14 0551  WBC 8.8  HGB 11.9*  HCT 36.3  NA 143  K 3.8  CL 111  CO2 21  BUN 16  CREATININE 0.66   Liver Panel No results found for this basename: PROT, ALBUMIN, AST, ALT, ALKPHOS, BILITOT, BILIDIR, IBILI,  in the last 72 hours Sedimentation Rate No results found for this basename: ESRSEDRATE,  in the last 72 hours C-Reactive Protein No results found for this basename: CRP,  in the last 72 hours  Microbiology: Recent Results (from the past 240 hour(s))  CULTURE, BLOOD (ROUTINE X 2)     Status: None   Collection Time    04/25/14  6:50 PM      Result Value Ref Range Status   Specimen Description BLOOD HAND RIGHT   Final   Special Requests BOTTLES DRAWN AEROBIC AND ANAEROBIC 5CC   Final   Culture  Setup Time     Final   Value:  04/26/2014 00:54     Performed at [facility]   Culture     Final   Value: STAPHYLOCOCCUS AUREUS     Note: RIFAMPIN AND GENTAMICIN SHOULD NOT BE USED AS SINGLE DRUGS FOR TREATMENT OF STAPH INFECTIONS. This organism DOES NOT demonstrate inducible Clindamycin resistance in vitro.     Note: Gram Stain Report Called to,Read Back By and Verified With: NICOLE Z BY INGRAM A 3PM 04/26/14     Performed at [facility]   Report Status 04/28/2014 FINAL   Final   Organism ID, Bacteria STAPHYLOCOCCUS AUREUS   Final  URINE CULTURE     Status: None   Collection Time    04/25/14  7:26 PM      Result Value Ref Range Status   Specimen Description URINE, CATHETERIZED   Final   Special Requests Normal   Final   Culture  Setup Time     Final   Value: 04/26/2014 01:02     Performed at [facility]     Final   Value: NO GROWTH     Performed at [facility]   Culture     Final   Value: NO GROWTH     Performed at [facility]   Report Status 04/26/2014 FINAL   Final  MRSA PCR SCREENING     Status: None   Collection Time    04/25/14 11:53 PM      Result Value Ref Range Status   MRSA by PCR NEGATIVE  NEGATIVE Final   Comment:            The GeneXpert MRSA Assay (FDA     approved for NASAL specimens     only), is one component of a     comprehensive MRSA colonization     surveillance program. It is not     intended to diagnose MRSA     infection nor to guide or     monitor treatment for     MRSA infections.  CULTURE, BLOOD (ROUTINE X 2)     Status: None   Collection Time    04/26/14  1:10 AM      Result Value Ref Range Status   Specimen Description BLOOD CENTRAL LINE   Final   Special Requests  Final   Value: BOTTLES DRAWN AEROBIC AND ANAEROBIC AER 10CC ANA 2CC   Culture  Setup Time     Final   Value: 04/26/2014 08:00     Performed at [facility]   Culture     Final   Value: STAPHYLOCOCCUS AUREUS     Note: RIFAMPIN AND  GENTAMICIN SHOULD NOT BE USED AS SINGLE DRUGS FOR TREATMENT OF STAPH INFECTIONS. This organism DOES NOT demonstrate inducible Clindamycin resistance in vitro.     Note: Gram Stain Report Called to,Read Back By and Verified With: Dorena Bodo 04/29/14 @ 11:46AM BY RUSCOE A.     Performed at [facility]   Report Status 05/01/2014 FINAL   Final   Organism ID, Bacteria STAPHYLOCOCCUS AUREUS   Final  ANAEROBIC CULTURE     Status: None   Collection Time    04/26/14  8:58 AM      Result Value Ref Range Status   Specimen Description TISSUE HIP LEFT   Final   Special Requests LEFT DEEP TISSUE PT ON VANCO   Final   Gram Stain     Final   Value: ABUNDANT WBC PRESENT, PREDOMINANTLY PMN     NO SQUAMOUS EPITHELIAL CELLS SEEN     RARE GRAM POSITIVE COCCI     IN PAIRS     Performed at [facility]   Culture     Final   Value: NO ANAEROBES ISOLATED     Performed at [facility]   Report Status 05/01/2014 FINAL   Final  TISSUE CULTURE     Status: None   Collection Time    04/26/14  8:58 AM      Result Value Ref Range Status   Specimen Description TISSUE HIP LEFT   Final   Special Requests LEFT DEEP TISSUE PT ON VANCO   Final   Gram Stain     Final   Value: ABUNDANT WBC PRESENT, PREDOMINANTLY PMN     NO SQUAMOUS EPITHELIAL CELLS SEEN     RARE GRAM POSITIVE COCCI     IN PAIRS     Performed at [facility]   Culture     Final   Value: FEW STAPHYLOCOCCUS AUREUS     Note: RIFAMPIN AND GENTAMICIN SHOULD NOT BE USED AS SINGLE DRUGS FOR TREATMENT OF STAPH INFECTIONS. This organism DOES NOT demonstrate inducible Clindamycin resistance in vitro.     Performed at [facility]   Report Status 04/28/2014 FINAL   Final   Organism ID, Bacteria STAPHYLOCOCCUS AUREUS   Final  CULTURE, BLOOD (ROUTINE X 2)     Status: None   Collection Time    05/01/14  1:03 PM      Result Value Ref Range Status   Specimen Description BLOOD RIGHT HAND   Final   Special Requests  BOTTLES DRAWN AEROBIC ONLY 5CC   Final   Culture  Setup Time     Final   Value: 05/01/2014 17:22     Performed at [facility]   Culture     Final   Value:        BLOOD CULTURE RECEIVED NO GROWTH TO DATE CULTURE WILL BE HELD FOR 5 DAYS BEFORE ISSUING A FINAL NEGATIVE REPORT     Performed at [facility]   Report Status PENDING   Incomplete  CULTURE, BLOOD (ROUTINE X 2)     Status: None   Collection Time    05/01/14  1:07 PM  Result Value Ref Range Status   Specimen Description BLOOD RIGHT ANTECUBITAL   Final   Special Requests BOTTLES DRAWN AEROBIC ONLY 3CC   Final   Culture  Setup Time     Final   Value: 05/01/2014 17:21     Performed at [facility]   Culture     Final   Value:        BLOOD CULTURE RECEIVED NO GROWTH TO DATE CULTURE WILL BE HELD FOR 5 DAYS BEFORE ISSUING A FINAL NEGATIVE REPORT     Performed at [facility]   Report Status PENDING   Incomplete    Studies/Results: No results found.   Assessment/Plan: MSSA bacteremia  tte (-) Osteomyelitis Removal of THR, infected 4-29  Repeat BCx are negative to date.  Can place pic D/c planning (6 weeks of ancef) To LTAC today.   Total days of antibiotics; 10 (ancef)         [facility] Infect[facility] (pager) [phone] [email or web address] 05/02/2014, 2:54 PM  LOS: 7 days   **Disclaimer: This note may have been dictated with voice recognition software. Similar sounding words can inadvertently be transcribed and this note may contain transcription errors which may not have been corrected upon publication of note.**

## 2014-05-03 LAB — FOLATE RBC: RBC Folate: 877 ng/mL — ABNORMAL HIGH (ref >280)

## 2014-05-03 LAB — CBC WITH DIFFERENTIAL/PLATELET
BASOS ABS: 0 10*3/uL (ref 0.0–0.1)
BASOS PCT: 0 % (ref 0–1)
EOS PCT: 2 % (ref 0–5)
Eosinophils Absolute: 0.2 10*3/uL (ref 0.0–0.7)
HCT: 37.4 % (ref 36.0–46.0)
Hemoglobin: 12.1 g/dL (ref 12.0–15.0)
Lymphocytes Relative: 17 % (ref 12–46)
Lymphs Abs: 1.4 10*3/uL (ref 0.7–4.0)
MCH: 29.7 pg (ref 26.0–34.0)
MCHC: 32.4 g/dL (ref 30.0–36.0)
MCV: 91.7 fL (ref 78.0–100.0)
MONO ABS: 0.8 10*3/uL (ref 0.1–1.0)
Monocytes Relative: 9 % (ref 3–12)
NEUTROS ABS: 5.8 10*3/uL (ref 1.7–7.7)
Neutrophils Relative %: 72 % (ref 43–77)
Platelets: 270 10*3/uL (ref 150–400)
RBC: 4.08 MIL/uL (ref 3.87–5.11)
RDW: 19.2 % — ABNORMAL HIGH (ref 11.5–15.5)
WBC: 8.2 10*3/uL (ref 4.0–10.5)

## 2014-05-03 LAB — COMPREHENSIVE METABOLIC PANEL
ALBUMIN: 1.6 g/dL — AB (ref 3.5–5.2)
ALT: <5 U/L (ref 0–35)
AST: 63 U/L — AB (ref 0–37)
Alkaline Phosphatase: 674 U/L — ABNORMAL HIGH (ref 39–117)
BUN: 15 mg/dL (ref 6–23)
CALCIUM: 9.8 mg/dL (ref 8.4–10.5)
CO2: 26 mEq/L (ref 19–32)
CREATININE: 0.59 mg/dL (ref 0.50–1.10)
Chloride: 107 mEq/L (ref 96–112)
GFR calc Af Amer: >90 mL/min (ref >90)
GFR calc non Af Amer: 79 mL/min — ABNORMAL LOW (ref >90)
Glucose, Bld: 80 mg/dL (ref 70–99)
Potassium: 4 mEq/L (ref 3.7–5.3)
Sodium: 143 mEq/L (ref 137–147)
Total Bilirubin: 0.5 mg/dL (ref 0.3–1.2)
Total Protein: 5 g/dL — ABNORMAL LOW (ref 6.0–8.3)

## 2014-05-03 LAB — TSH: TSH: 1.25 u[IU]/mL (ref 0.350–4.500)

## 2014-05-03 LAB — PREALBUMIN: PREALBUMIN: 17 mg/dL — AB (ref 17.0–34.0)

## 2014-05-03 LAB — C-REACTIVE PROTEIN: CRP: 2 mg/dL — AB (ref <0.60)

## 2014-05-03 LAB — VITAMIN B12: Vitamin B-12: 714 pg/mL (ref 211–911)

## 2014-05-03 LAB — PHOSPHORUS: PHOSPHORUS: 1.3 mg/dL — AB (ref 2.3–4.6)

## 2014-05-03 LAB — PROCALCITONIN: Procalcitonin: 0.32 ng/mL

## 2014-05-03 LAB — SEDIMENTATION RATE: Sed Rate: 33 mm/hr — ABNORMAL HIGH (ref 0–22)

## 2014-05-03 LAB — T4, FREE: Free T4: 1.2 ng/dL (ref 0.80–1.80)

## 2014-05-03 LAB — MAGNESIUM: Magnesium: 1.8 mg/dL (ref 1.5–2.5)

## 2014-05-04 ENCOUNTER — Other Ambulatory Visit (HOSPITAL_COMMUNITY): Payer: Self-pay

## 2014-05-04 ENCOUNTER — Encounter: Payer: Self-pay | Admitting: Radiology

## 2014-05-04 ENCOUNTER — Other Ambulatory Visit (HOSPITAL_COMMUNITY): Payer: Medicare Other

## 2014-05-04 LAB — CK TOTAL AND CKMB (NOT AT ARMC)
CK TOTAL: 63 U/L (ref 7–177)
CK, MB: 3.2 ng/mL (ref 0.3–4.0)
CK, MB: 3.5 ng/mL (ref 0.3–4.0)
RELATIVE INDEX: INVALID (ref 0.0–2.5)
RELATIVE INDEX: INVALID (ref 0.0–2.5)
Total CK: 48 U/L (ref 7–177)

## 2014-05-04 LAB — COMPREHENSIVE METABOLIC PANEL
ALT: 5 U/L (ref 0–35)
AST: 76 U/L — ABNORMAL HIGH (ref 0–37)
Albumin: 1.9 g/dL — ABNORMAL LOW (ref 3.5–5.2)
Alkaline Phosphatase: 818 U/L — ABNORMAL HIGH (ref 39–117)
BUN: 15 mg/dL (ref 6–23)
CALCIUM: 9.6 mg/dL (ref 8.4–10.5)
CO2: 26 mEq/L (ref 19–32)
CREATININE: 0.55 mg/dL (ref 0.50–1.10)
Chloride: 100 mEq/L (ref 96–112)
GFR calc Af Amer: >90 mL/min (ref >90)
GFR calc non Af Amer: 81 mL/min — ABNORMAL LOW (ref >90)
GLUCOSE: 186 mg/dL — AB (ref 70–99)
Potassium: 3.7 mEq/L (ref 3.7–5.3)
SODIUM: 138 meq/L (ref 137–147)
TOTAL PROTEIN: 5.8 g/dL — AB (ref 6.0–8.3)
Total Bilirubin: 0.7 mg/dL (ref 0.3–1.2)

## 2014-05-04 LAB — CBC
HCT: 42 % (ref 36.0–46.0)
HEMOGLOBIN: 14.1 g/dL (ref 12.0–15.0)
MCH: 30.5 pg (ref 26.0–34.0)
MCHC: 33.6 g/dL (ref 30.0–36.0)
MCV: 90.7 fL (ref 78.0–100.0)
PLATELETS: 351 10*3/uL (ref 150–400)
RBC: 4.63 MIL/uL (ref 3.87–5.11)
RDW: 18.6 % — ABNORMAL HIGH (ref 11.5–15.5)
WBC: 13.9 10*3/uL — ABNORMAL HIGH (ref 4.0–10.5)

## 2014-05-04 LAB — TROPONIN I
Troponin I: <0.3 ng/mL (ref <0.30)
Troponin I: <0.3 ng/mL (ref <0.30)

## 2014-05-05 LAB — URINALYSIS, ROUTINE W REFLEX MICROSCOPIC
Bilirubin Urine: NEGATIVE
GLUCOSE, UA: NEGATIVE mg/dL
Ketones, ur: 15 mg/dL — AB
Nitrite: NEGATIVE
SPECIFIC GRAVITY, URINE: 1.034 — AB (ref 1.005–1.030)
UROBILINOGEN UA: 0.2 mg/dL (ref 0.0–1.0)
pH: 5 (ref 5.0–8.0)

## 2014-05-05 LAB — BASIC METABOLIC PANEL
BUN: 17 mg/dL (ref 6–23)
CALCIUM: 9 mg/dL (ref 8.4–10.5)
CO2: 25 meq/L (ref 19–32)
Chloride: 99 mEq/L (ref 96–112)
Creatinine, Ser: 0.63 mg/dL (ref 0.50–1.10)
GFR calc Af Amer: >90 mL/min (ref >90)
GFR, EST NON AFRICAN AMERICAN: 78 mL/min — AB (ref >90)
GLUCOSE: 130 mg/dL — AB (ref 70–99)
POTASSIUM: 3.3 meq/L — AB (ref 3.7–5.3)
Sodium: 137 mEq/L (ref 137–147)

## 2014-05-05 LAB — TROPONIN I: TROPONIN I: 0.32 ng/mL — AB (ref <0.30)

## 2014-05-05 LAB — CBC
HCT: 37.1 % (ref 36.0–46.0)
HEMOGLOBIN: 12.1 g/dL (ref 12.0–15.0)
MCH: 30 pg (ref 26.0–34.0)
MCHC: 32.6 g/dL (ref 30.0–36.0)
MCV: 92.1 fL (ref 78.0–100.0)
PLATELETS: 241 10*3/uL (ref 150–400)
RBC: 4.03 MIL/uL (ref 3.87–5.11)
RDW: 18.7 % — ABNORMAL HIGH (ref 11.5–15.5)
WBC: 13.1 10*3/uL — AB (ref 4.0–10.5)

## 2014-05-05 LAB — URINE MICROSCOPIC-ADD ON

## 2014-05-05 LAB — CK TOTAL AND CKMB (NOT AT ARMC)
CK, MB: 4.1 ng/mL — ABNORMAL HIGH (ref 0.3–4.0)
Relative Index: INVALID (ref 0.0–2.5)
Total CK: 66 U/L (ref 7–177)

## 2014-05-05 LAB — PROCALCITONIN: PROCALCITONIN: 1.17 ng/mL

## 2014-05-05 LAB — AMMONIA: AMMONIA: 35 umol/L (ref 11–60)

## 2014-05-06 LAB — URINE CULTURE
CULTURE: NO GROWTH
Colony Count: NO GROWTH

## 2014-05-07 LAB — CULTURE, BLOOD (ROUTINE X 2)
CULTURE: NO GROWTH
Culture: NO GROWTH

## 2014-05-11 LAB — CULTURE, BLOOD (ROUTINE X 2)
CULTURE: NO GROWTH
Culture: NO GROWTH

## 2014-05-29 DEATH — deceased

## 2014-07-10 ENCOUNTER — Ambulatory Visit: Payer: Medicare Other | Admitting: Internal Medicine

## 2014-09-08 IMAGING — CR DG CHEST 1V PORT
2 series · 2 of 2 positions shown · non-contrast
Comparison: Chest radiograph 04/26/2014 and 04/25/2014

CLINICAL DATA: Followup oral aspiration.

EXAM:
PORTABLE CHEST - 1 VIEW

[AP (1 of 2)]
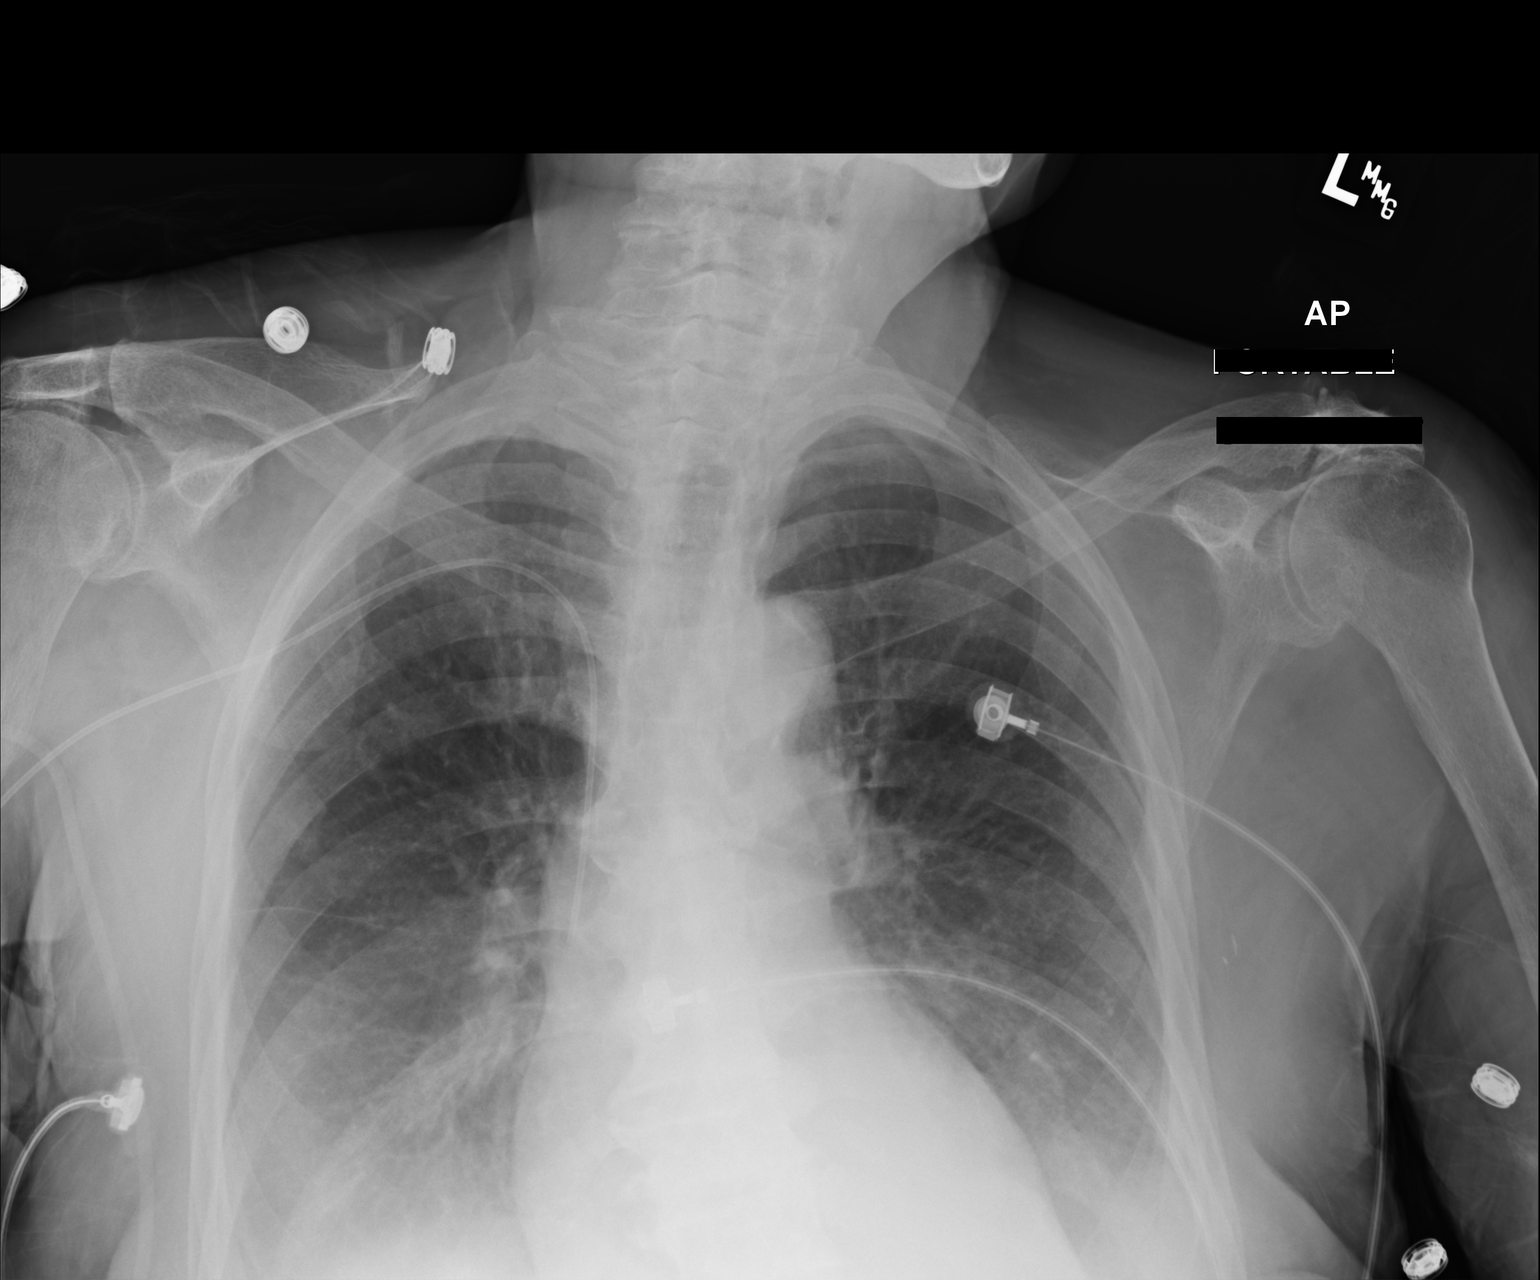

[AP (2 of 2)]
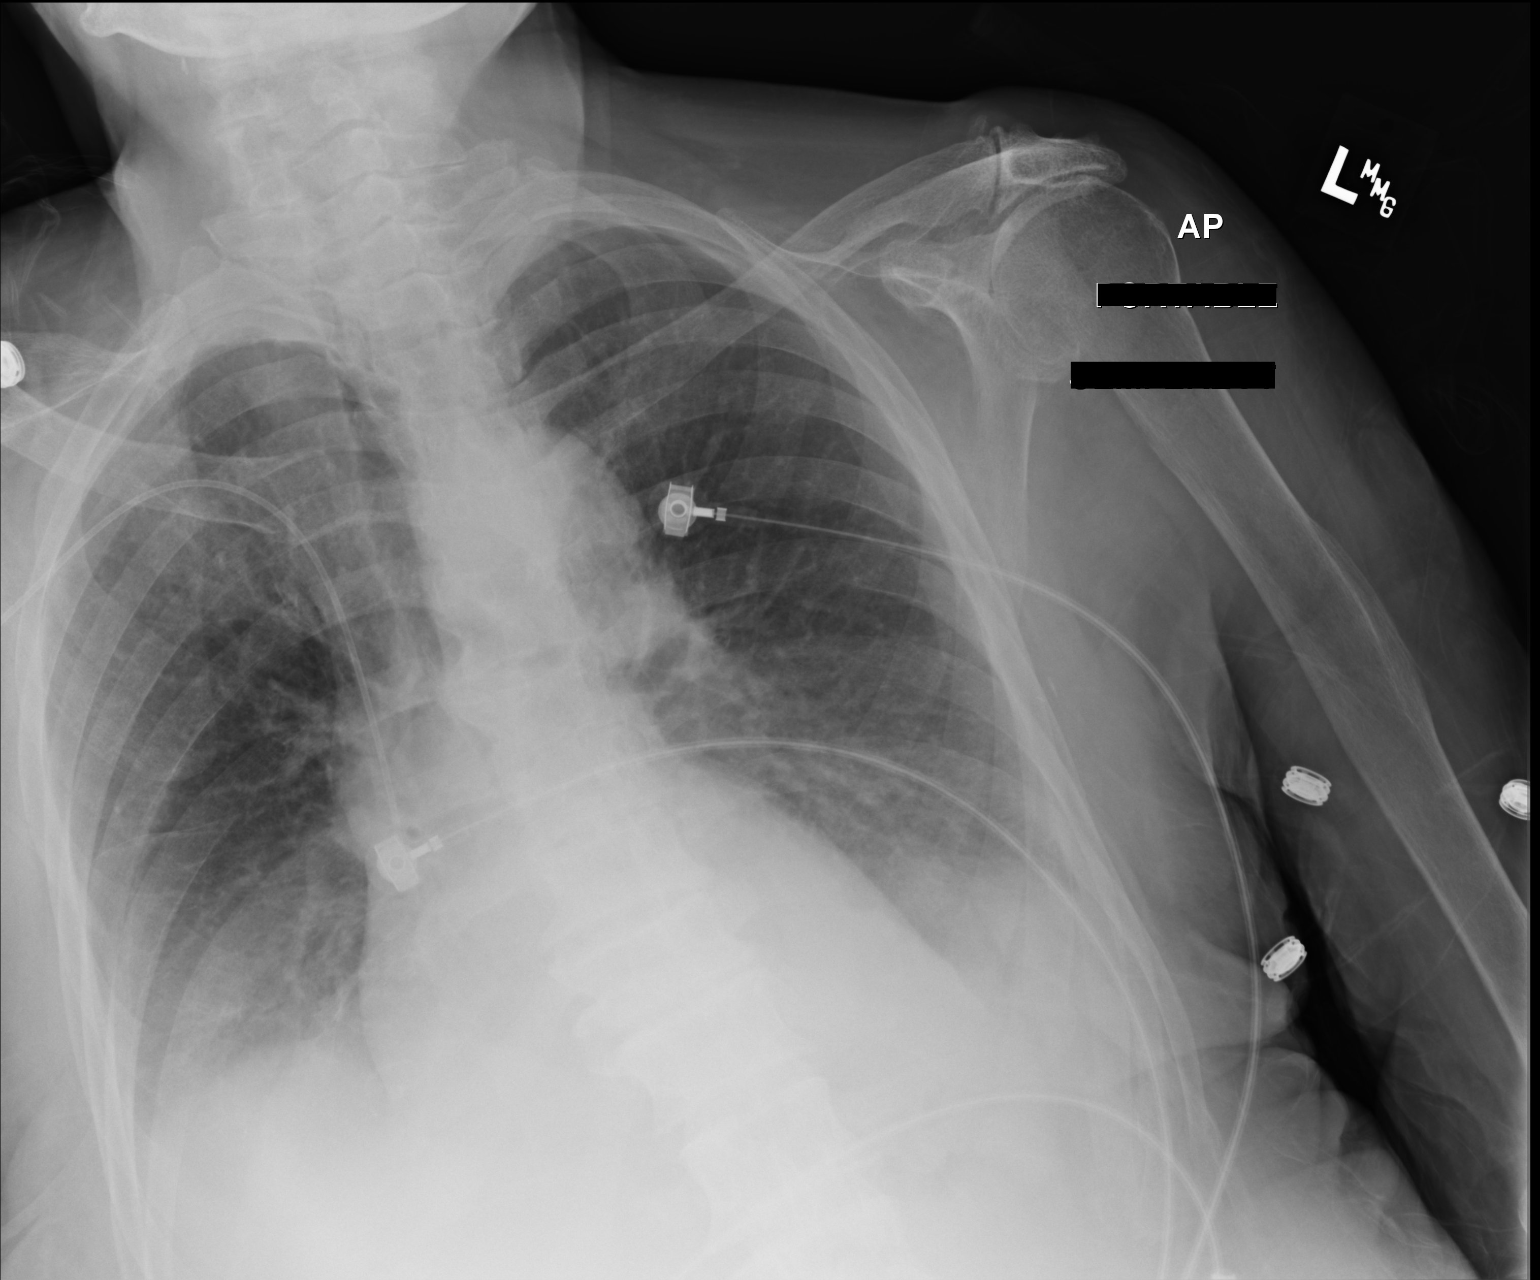

[2 of 2 positions shown; findings below may reference images not displayed]

FINDINGS: One of the AP views of the chest excludes the extreme lung bases. On
the second image performed, the patient is leaning toward the right.
A right upper extremity PICC projects over the distal superior vena
cava, in satisfactory position. Heart size is within normal limits.
Pulmonary vascularity is diffusely and mildly congested. There are
hazy opacities at both lung bases which may reflect posteriorly
layering pleural effusions. No consolidation is seen. Degenerative
changes of the left shoulder.
IMPRESSION: 1. Question posteriorly layering pleural effusions bilaterally. This
finding is not definite. PA and lateral chest radiographs or
decubitus chest radiographs could be useful for further evaluation
if desired.
2. Satisfactory position of right upper extremity PICC.

## 2014-09-08 IMAGING — CT CT HEAD W/O CM
1 series · 16 of 30 positions shown, 20 images · non-contrast
Comparison: CT HEAD W/O CM dated 04/02/2014

CLINICAL DATA: Altered mental status

EXAM:
CT HEAD WITHOUT CONTRAST
TECHNIQUE: Contiguous axial images were obtained from the base of the skull
through the vertex without intravenous contrast.

[Series 2: head 5.0 h30s · axial · 0.41mm/px · z∈[-167,-32]mm · 16 of 31 slices shown, 20 images]
[im 2/31  brain]
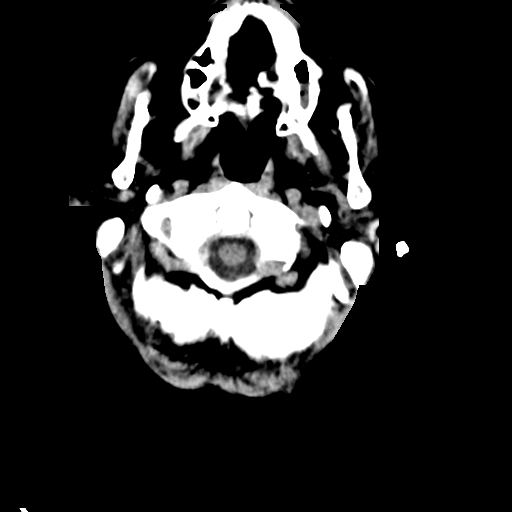
[im 2/31  bone]
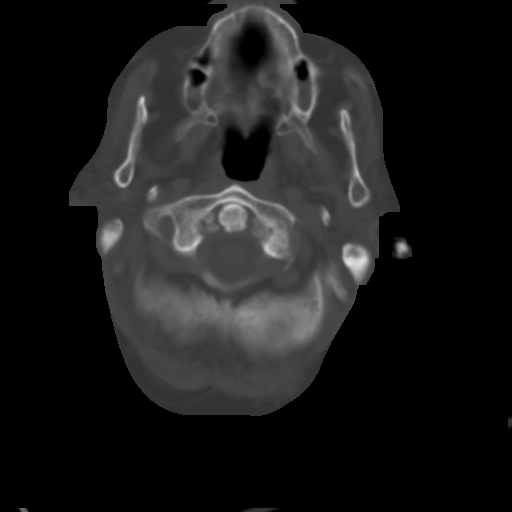
[im 4/31  brain]
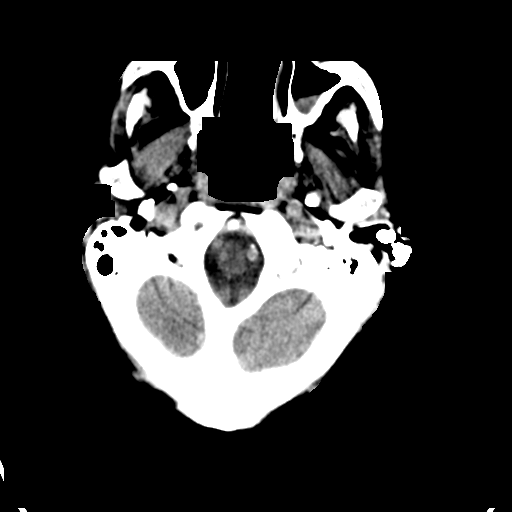
[im 6/31  brain]
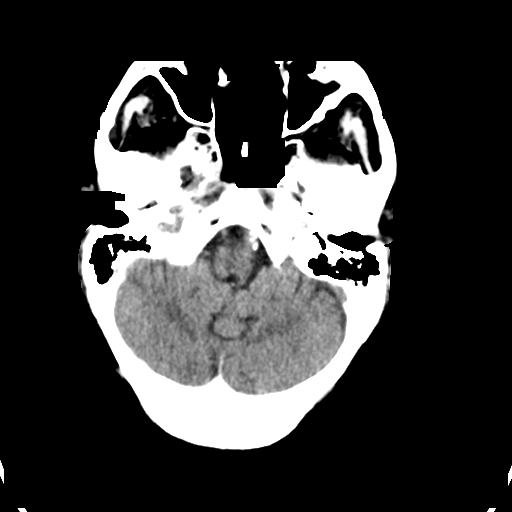
[im 8/31  brain]
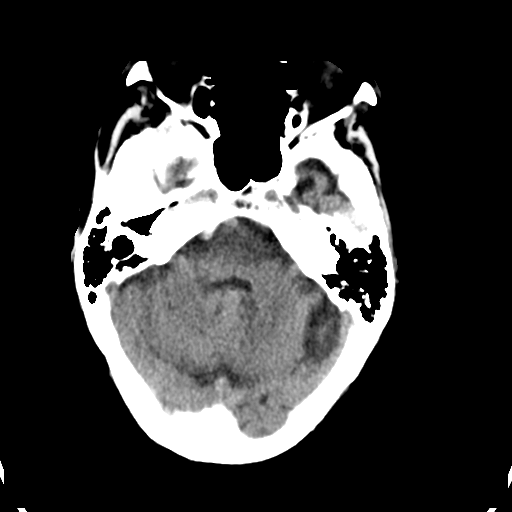
[im 9/31  brain]
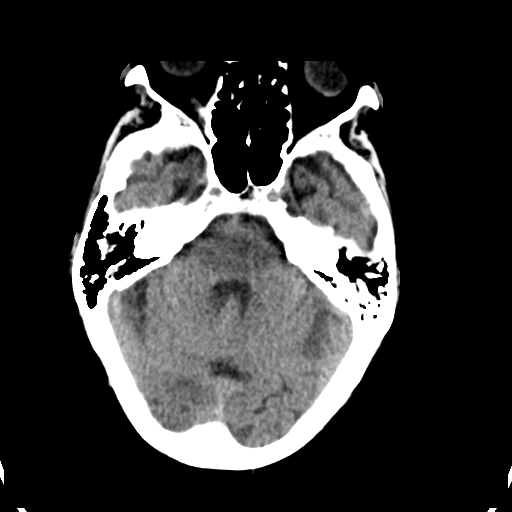
[im 9/31  bone]
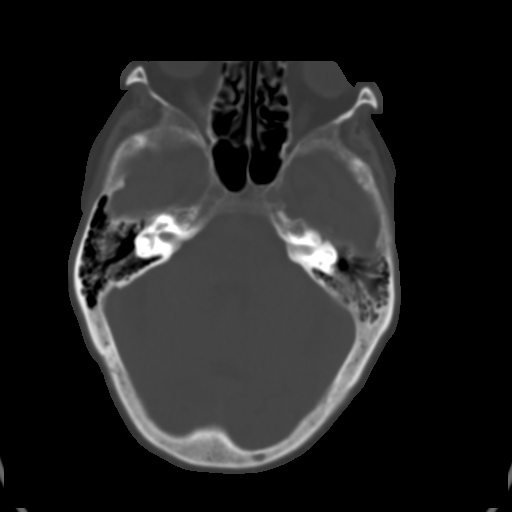
[im 11/31  brain]
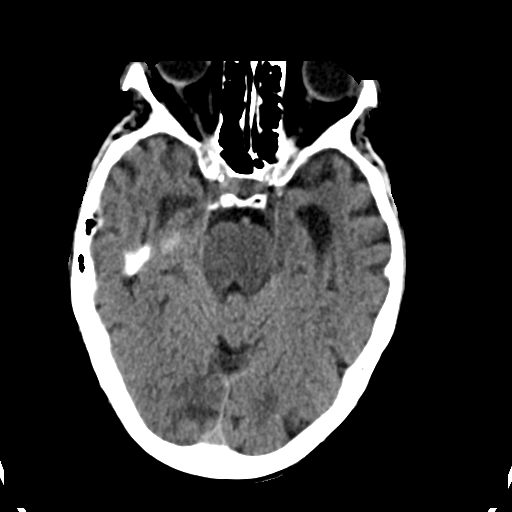
[im 13/31  brain]
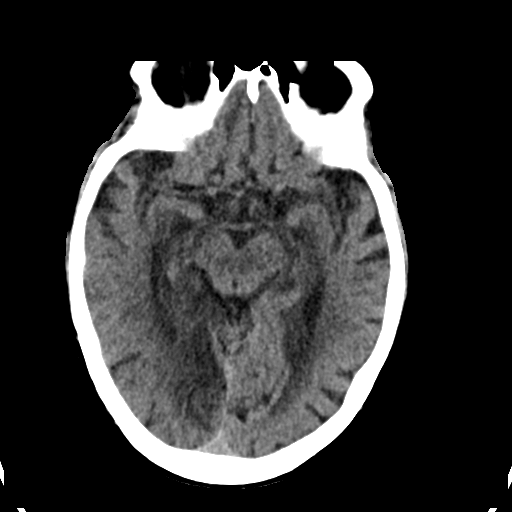
[im 15/31  brain]
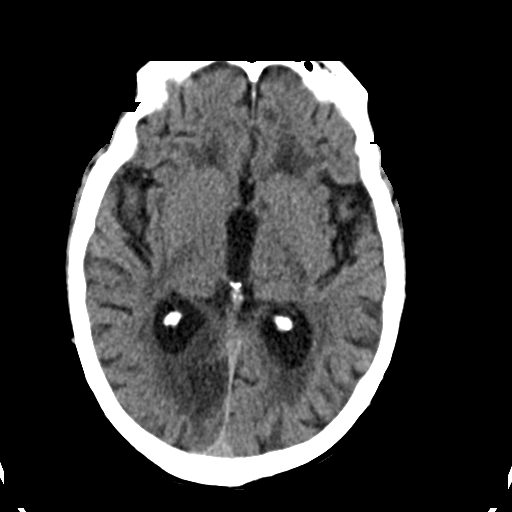
[im 16/31  brain]
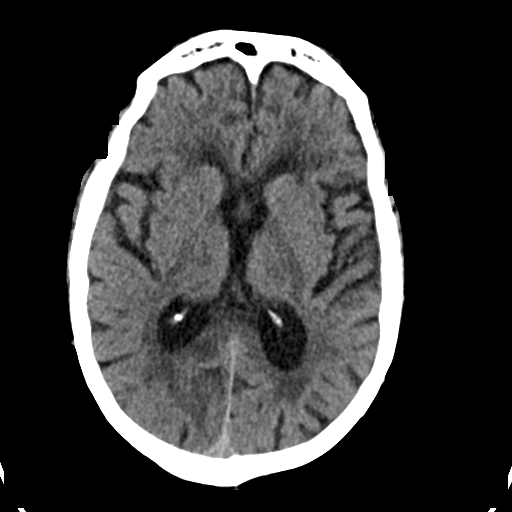
[im 16/31  bone]
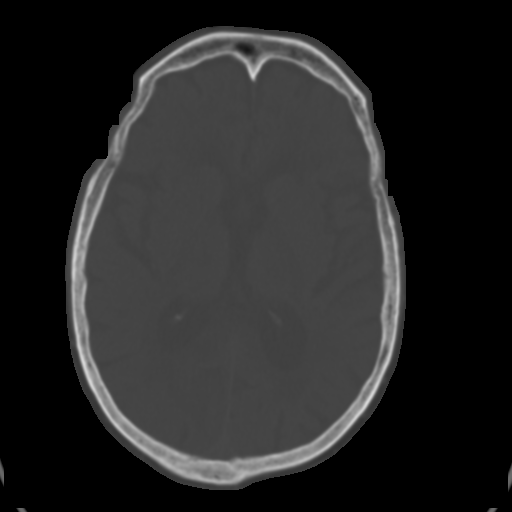
[im 18/31  brain]
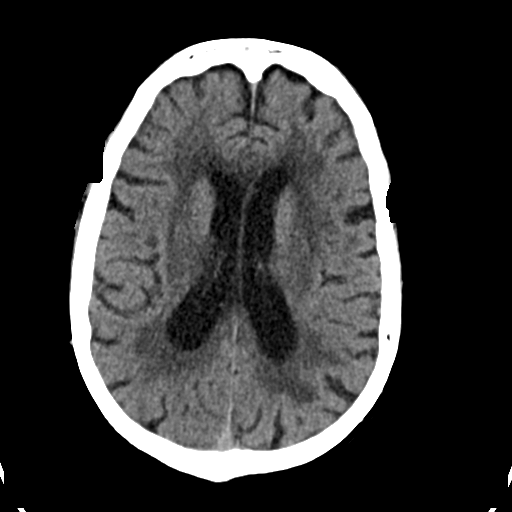
[im 20/31  brain]
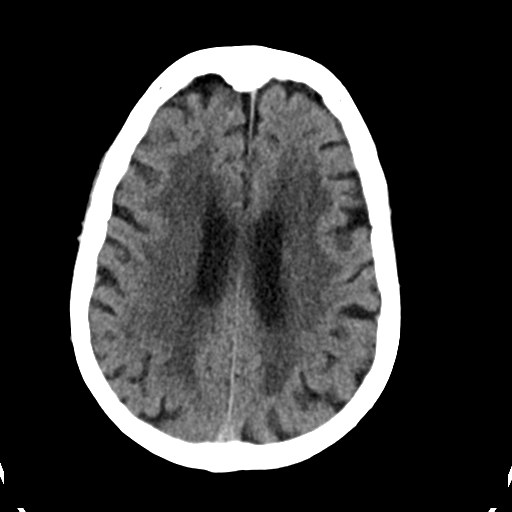
[im 22/31  brain]
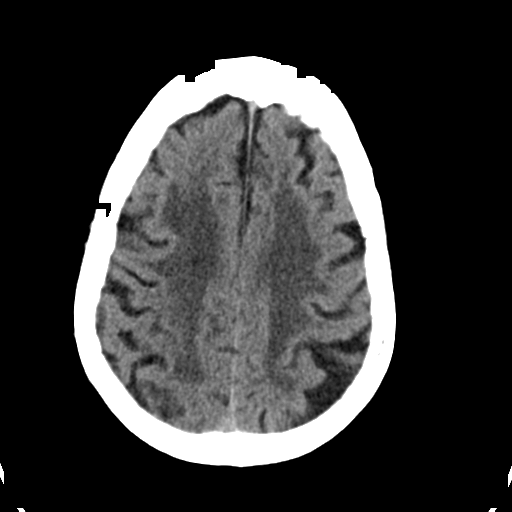
[im 23/31  brain]
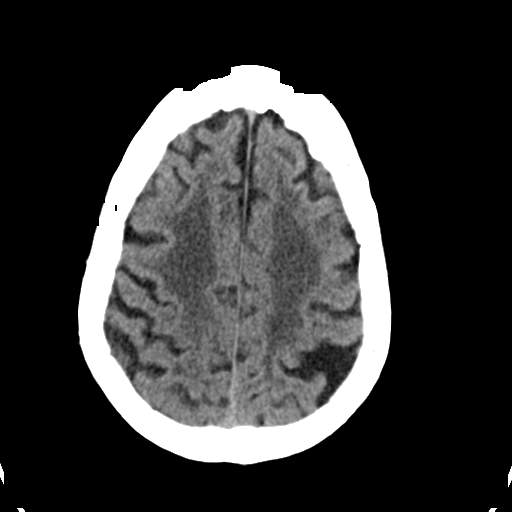
[im 23/31  bone]
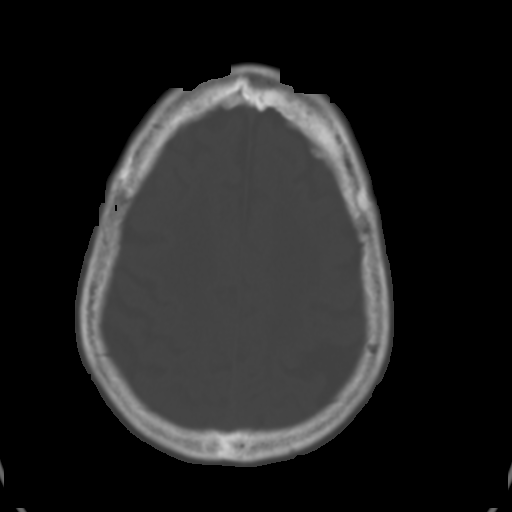
[im 25/31  brain]
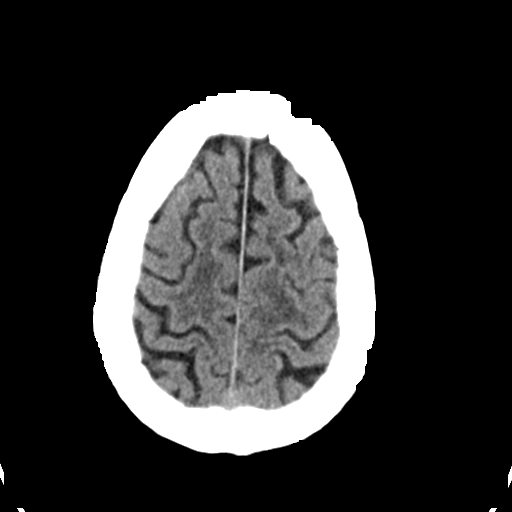
[im 27/31  brain]
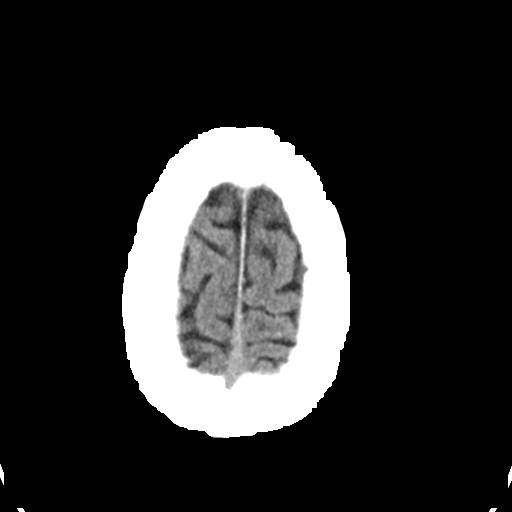
[im 29/31  brain]
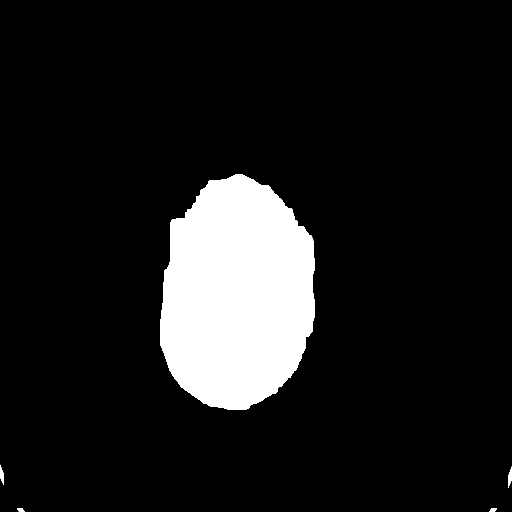

[16 of 30 positions shown; findings below may reference images not displayed]

FINDINGS: There is no evidence of mass effect, midline shift or extra-axial
fluid collections. There is no evidence of a space-occupying lesion
or intracranial hemorrhage. There is no evidence of a cortical-based
area of acute infarction. There is an old left posterior
parieto-occipital lobe infarct. There is periventricular white
matter low attenuation likely secondary to microangiopathy. There is
mild generalized cerebral atrophy.

The ventricles and sulci are appropriate for the patient's age. The
basal cisterns are patent.

Visualized portions of the orbits are unremarkable. There is
bilateral maxillary sinus and ethmoid sinus mucosal thickening.

The osseous structures are unremarkable.
IMPRESSION: 1. No acute intracranial pathology.
